# Patient Record
Sex: Female | Born: 1965 | Race: White | Hispanic: No | Marital: Single | Smoking: Never smoker
Health system: Southern US, Community
[De-identification: ages and names within clinical notes are randomized; demographics above are authoritative.]

## PROBLEM LIST (undated history)

## (undated) DIAGNOSIS — E8881 Metabolic syndrome: Secondary | ICD-10-CM

## (undated) DIAGNOSIS — R748 Abnormal levels of other serum enzymes: Secondary | ICD-10-CM

## (undated) DIAGNOSIS — L249 Irritant contact dermatitis, unspecified cause: Secondary | ICD-10-CM

## (undated) DIAGNOSIS — E559 Vitamin D deficiency, unspecified: Secondary | ICD-10-CM

## (undated) DIAGNOSIS — T7840XA Allergy, unspecified, initial encounter: Secondary | ICD-10-CM

## (undated) HISTORY — DX: Irritant contact dermatitis, unspecified cause: L24.9

## (undated) HISTORY — DX: Abnormal levels of other serum enzymes: R74.8

## (undated) HISTORY — DX: Metabolic syndrome: E88.81

## (undated) HISTORY — DX: Metabolic syndrome: E88.810

## (undated) HISTORY — DX: Vitamin D deficiency, unspecified: E55.9

## (undated) HISTORY — DX: Allergy, unspecified, initial encounter: T78.40XA

---

## 2008-08-15 ENCOUNTER — Ambulatory Visit: Payer: Self-pay | Admitting: Family Medicine

## 2008-09-21 ENCOUNTER — Ambulatory Visit: Payer: Self-pay | Admitting: Family Medicine

## 2009-03-25 ENCOUNTER — Ambulatory Visit: Payer: Self-pay | Admitting: Family Medicine

## 2009-10-16 ENCOUNTER — Ambulatory Visit: Payer: Self-pay | Admitting: Family Medicine

## 2010-11-18 ENCOUNTER — Ambulatory Visit: Payer: Self-pay | Admitting: Family Medicine

## 2011-10-13 LAB — HM PAP SMEAR: HM PAP: NORMAL

## 2011-11-19 ENCOUNTER — Ambulatory Visit: Payer: Self-pay | Admitting: Family Medicine

## 2012-11-21 ENCOUNTER — Ambulatory Visit: Payer: Self-pay | Admitting: Family Medicine

## 2013-10-17 LAB — LIPID PANEL
Cholesterol: 173 mg/dL (ref 0–200)
HDL: 36 mg/dL (ref 35–70)
LDL Cholesterol: 111 mg/dL
Triglycerides: 129 mg/dL (ref 40–160)

## 2013-10-17 LAB — HEMOGLOBIN A1C: HEMOGLOBIN A1C: 6 % (ref 4.0–6.0)

## 2013-11-29 ENCOUNTER — Ambulatory Visit: Payer: Self-pay | Admitting: Family Medicine

## 2013-11-29 LAB — HM MAMMOGRAPHY: HM Mammogram: NORMAL

## 2014-01-14 ENCOUNTER — Emergency Department: Payer: Self-pay | Admitting: Emergency Medicine

## 2014-10-17 ENCOUNTER — Encounter: Payer: Self-pay | Admitting: Family Medicine

## 2014-10-17 DIAGNOSIS — L249 Irritant contact dermatitis, unspecified cause: Secondary | ICD-10-CM | POA: Insufficient documentation

## 2014-10-17 DIAGNOSIS — Z780 Asymptomatic menopausal state: Secondary | ICD-10-CM | POA: Insufficient documentation

## 2014-10-17 DIAGNOSIS — R748 Abnormal levels of other serum enzymes: Secondary | ICD-10-CM | POA: Insufficient documentation

## 2014-10-17 DIAGNOSIS — E669 Obesity, unspecified: Secondary | ICD-10-CM | POA: Insufficient documentation

## 2014-10-17 DIAGNOSIS — E8881 Metabolic syndrome: Secondary | ICD-10-CM | POA: Insufficient documentation

## 2014-10-17 DIAGNOSIS — E559 Vitamin D deficiency, unspecified: Secondary | ICD-10-CM | POA: Insufficient documentation

## 2014-10-17 DIAGNOSIS — J302 Other seasonal allergic rhinitis: Secondary | ICD-10-CM | POA: Insufficient documentation

## 2014-10-17 DIAGNOSIS — Z91030 Bee allergy status: Secondary | ICD-10-CM | POA: Insufficient documentation

## 2014-10-17 DIAGNOSIS — L42 Pityriasis rosea: Secondary | ICD-10-CM | POA: Insufficient documentation

## 2014-10-18 ENCOUNTER — Ambulatory Visit (INDEPENDENT_AMBULATORY_CARE_PROVIDER_SITE_OTHER): Payer: BLUE CROSS/BLUE SHIELD | Admitting: Family Medicine

## 2014-10-18 ENCOUNTER — Encounter: Payer: Self-pay | Admitting: Family Medicine

## 2014-10-18 ENCOUNTER — Other Ambulatory Visit: Payer: Self-pay | Admitting: Family Medicine

## 2014-10-18 VITALS — HR 79 | Temp 97.7°F | Resp 18 | Ht 65.0 in | Wt 207.7 lb

## 2014-10-18 DIAGNOSIS — Z124 Encounter for screening for malignant neoplasm of cervix: Secondary | ICD-10-CM

## 2014-10-18 DIAGNOSIS — Z1211 Encounter for screening for malignant neoplasm of colon: Secondary | ICD-10-CM

## 2014-10-18 DIAGNOSIS — R748 Abnormal levels of other serum enzymes: Secondary | ICD-10-CM

## 2014-10-18 DIAGNOSIS — L249 Irritant contact dermatitis, unspecified cause: Secondary | ICD-10-CM | POA: Diagnosis not present

## 2014-10-18 DIAGNOSIS — Z7189 Other specified counseling: Secondary | ICD-10-CM

## 2014-10-18 DIAGNOSIS — Z719 Counseling, unspecified: Secondary | ICD-10-CM

## 2014-10-18 DIAGNOSIS — Z13220 Encounter for screening for lipoid disorders: Secondary | ICD-10-CM | POA: Diagnosis not present

## 2014-10-18 DIAGNOSIS — Z0000 Encounter for general adult medical examination without abnormal findings: Secondary | ICD-10-CM

## 2014-10-18 DIAGNOSIS — E559 Vitamin D deficiency, unspecified: Secondary | ICD-10-CM | POA: Diagnosis not present

## 2014-10-18 DIAGNOSIS — Z114 Encounter for screening for human immunodeficiency virus [HIV]: Secondary | ICD-10-CM

## 2014-10-18 DIAGNOSIS — J302 Other seasonal allergic rhinitis: Secondary | ICD-10-CM | POA: Diagnosis not present

## 2014-10-18 DIAGNOSIS — Z1239 Encounter for other screening for malignant neoplasm of breast: Secondary | ICD-10-CM | POA: Diagnosis not present

## 2014-10-18 DIAGNOSIS — R739 Hyperglycemia, unspecified: Secondary | ICD-10-CM

## 2014-10-18 DIAGNOSIS — Z91038 Other insect allergy status: Secondary | ICD-10-CM | POA: Diagnosis not present

## 2014-10-18 DIAGNOSIS — Z01419 Encounter for gynecological examination (general) (routine) without abnormal findings: Secondary | ICD-10-CM

## 2014-10-18 DIAGNOSIS — Z91030 Bee allergy status: Secondary | ICD-10-CM

## 2014-10-18 MED ORDER — EPINEPHRINE 0.3 MG/0.3ML IJ SOAJ: 0.3000 mg | Freq: Once | INTRAMUSCULAR | Status: DC

## 2014-10-18 MED ORDER — FLUTICASONE PROPIONATE 50 MCG/ACT NA SUSP: 2.0000 | NASAL | Status: DC | PRN

## 2014-10-18 MED ORDER — TRIAMCINOLONE ACETONIDE 0.1 % EX CREA: TOPICAL_CREAM | Freq: Two times a day (BID) | CUTANEOUS | Status: DC

## 2014-10-18 NOTE — Progress Notes (Signed)
Name: Tamara Nelson   MRN: [id number]    DOB: Jan 25, 1966   Date:10/18/2014       Progress Note  Subjective  Chief Complaint  Chief Complaint  Patient presents with  . Annual Exam    HPI  Well woman exam: contact dermatitis is under control with prn topical medication, AR is controlled with prn use of nasal spray. 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  Taking Vitamin D otc. Previously had elevation of alkaline phosphatase and glucose.  We will recheck labs.  Not currently sexually active.  Patient Active Problem List   Diagnosis Date Noted  . Bee sting allergy 10/17/2014  . Abnormal serum level of alkaline phosphatase 10/17/2014  . Menopause 10/17/2014  . Irritant contact dermatitis 10/17/2014  . Dysmetabolic syndrome 10/17/2014  . Obesity (BMI 30-39.9) 10/17/2014  . Vitamin D deficiency 10/17/2014  . Allergic rhinitis, seasonal 10/17/2014    History reviewed. No pertinent past surgical history.  Family History  Problem Relation Age of Onset  . Cancer Mother     colon  . Cancer Father     Liver and Lung  . Stroke Father   . Hypertension Brother   . Diabetes Brother     History   Social History  . Marital Status: Single    Spouse Name: N/A  . Number of Children: N/A  . Years of Education: N/A   Occupational History  . Not on file.   Social History Main Topics  . Smoking status: Never Smoker   . Smokeless tobacco: Never Used  . Alcohol Use: No  . Drug Use: No  . Sexual Activity: No   Other Topics Concern  . Not on file   Social History Narrative  . No narrative on file     Current outpatient prescriptions:  .  Cholecalciferol (VITAMIN D) 2000 UNITS CAPS, Take 1 capsule by mouth as needed., Disp: , Rfl:  .  diphenhydrAMINE (BENADRYL ALLERGY) 25 MG tablet, Take 1 tablet by mouth as needed., Disp: , Rfl:  .  fluticasone (FLONASE) 50 MCG/ACT nasal spray, Place 2 sprays into both  nostrils as needed for rhinitis., Disp: 16 g, Rfl: 2 .  glucose blood (ONETOUCH VERIO) test strip, ONETOUCH VERIO (In Vitro Strip)  1 (one) Strip Strip check fsbs daily for 30 days  Quantity: 1;  Refills: 5   Ordered :18-November-2012  Vinnie Langton ;  Started 14-October-2012 Active, Disp: , Rfl:  .  triamcinolone cream (KENALOG) 0.1 %, Apply topically 2 (two) times daily., Disp: 45 g, Rfl: 1 .  EPINEPHrine (EPIPEN 2-PAK) 0.3 mg/0.3 mL IJ SOAJ injection, Inject 0.3 mLs (0.3 mg total) into the muscle once., Disp: 2 Device, Rfl: 0  Allergies  Allergen Reactions  . Bee Venom      ROS   Constitutional: Negative for fever and has lost some weight Respiratory: Negative for cough and shortness of breath.   Cardiovascular: Negative for chest pain or palpitations.  Gastrointestinal: Negative for abdominal pain, had diarrhea yesterday but resolved  Musculoskeletal: Negative for gait problem or joint swelling.  Skin: Negative for rash.  Neurological: Negative for dizziness or headache.  No other specific complaints in a complete review of systems (except as listed in HPI above). Objective  Filed Vitals:   10/18/14 1427  Pulse: 79  Temp: 97.7 F (36.5 C)  TempSrc: Oral  Resp: 18  Height:  (1.651 m)  Weight: 207 lb 11.2 oz (94.212 kg)  SpO2: 97%    Body mass index is 34.56 kg/(m^2).  Physical Exam  Constitutional: Patient appears well-developed and well-nourished. No distress.  HENT: Head: Normocephalic and atraumatic. Ears: B TMs ok, no erythema or effusion; Nose: Nose normal. Mouth/Throat: Oropharynx is clear and moist. No oropharyngeal exudate.  Eyes: Conjunctivae and EOM are normal. Pupils are equal, round, and reactive to light. No scleral icterus.  Neck: Normal range of motion. Neck supple. No JVD present. No thyromegaly present.  Cardiovascular: Normal rate, regular rhythm and normal heart sounds.  No murmur heard. No BLE edema. Pulmonary/Chest: Effort normal and breath sounds  normal. No respiratory distress. Abdominal: Soft. Bowel sounds are normal, no distension. There is no tenderness. no masses Breast: no lumps or masses, no nipple discharge or rashes FEMALE GENITALIA:  External genitalia normal External urethra normal Vaginal vault normal without discharge or lesions Cervix normal without discharge , cervical os friable Bimanual exam normal without masses RECTAL: no external hemorrhoids Musculoskeletal: Normal range of motion, no joint effusions. No gross deformities Neurological: he is alert and oriented to person, place, and time. No cranial nerve deficit. Coordination, balance, strength, speech and gait are normal.  Skin: Skin is warm and dry. Some eczematous patches on her leg Psychiatric: Patient has a normal mood and affect. behavior is normal. Judgment and thought content normal.  PHQ2/9: Depression screen PHQ 2/9 10/18/2014  Decreased Interest 0  Down, Depressed, Hopeless 0  PHQ - 2 Score 0      Fall Risk: Fall Risk  10/18/2014  Falls in the past year? No      Assessment & Plan  1. Well woman exam  2. Cervical cancer screening  - Cytology - PAP  3. Health counseling Discussed importance of 150 minutes of physical activity weekly, eat two servings of fish weekly, eat one serving of tree nuts ( cashews, pistachios, pecans, almonds.Marland Kitchen) every other day, eat 6 servings of fruit/vegetables daily and drink plenty of water and avoid sweet beverages.   4. Breast cancer screening  -mammogram ordered  5. Colon cancer screening Up to date  6. Bee sting allergy  - EPINEPHrine (EPIPEN 2-PAK) 0.3 mg/0.3 mL IJ SOAJ injection; Inject 0.3 mLs (0.3 mg total) into the muscle once.  Dispense: 2 Device; Refill: 0  7. Allergic rhinitis, seasonal  - fluticasone (FLONASE) 50 MCG/ACT nasal spray; Place 2 sprays into both nostrils as needed for rhinitis.  Dispense: 16 g; Refill: 2  8. Irritant contact dermatitis  - triamcinolone cream (KENALOG) 0.1  %; Apply topically 2 (two) times daily.  Dispense: 45 g; Refill: 1  9. Abnormal serum level of alkaline phosphatase  - Comprehensive Metabolic Panel (CMET)  10. Vitamin D deficiency Recheck labs  11. Hyperglycemia  - HgB A1c  12. Lipid screening  - Lipid Profile  13. Screening for HIV (human immunodeficiency virus)  - HIV antibody (with reflex)

## 2014-10-18 NOTE — Patient Instructions (Signed)
Discussed importance of 150 minutes of physical activity weekly, eat two servings of fish weekly, eat one serving of tree nuts ( cashews, pistachios, pecans, almonds..) every other day, eat 6 servings of fruit/vegetables daily and drink plenty of water and avoid sweet beverages. 

## 2014-10-19 ENCOUNTER — Other Ambulatory Visit: Payer: Self-pay | Admitting: Family Medicine

## 2014-10-20 LAB — COMPREHENSIVE METABOLIC PANEL
ALT: 22 IU/L (ref 0–32)
AST: 15 IU/L (ref 0–40)
Albumin/Globulin Ratio: 1.4 (ref 1.1–2.5)
Albumin: 4 g/dL (ref 3.5–5.5)
Alkaline Phosphatase: 149 IU/L — ABNORMAL HIGH (ref 39–117)
BILIRUBIN TOTAL: 0.3 mg/dL (ref 0.0–1.2)
BUN/Creatinine Ratio: 19 (ref 9–23)
BUN: 14 mg/dL (ref 6–24)
CALCIUM: 9.1 mg/dL (ref 8.7–10.2)
CHLORIDE: 101 mmol/L (ref 97–108)
CO2: 23 mmol/L (ref 18–29)
Creatinine, Ser: 0.75 mg/dL (ref 0.57–1.00)
GFR calc Af Amer: 109 mL/min/{1.73_m2} (ref >59)
GFR calc non Af Amer: 95 mL/min/{1.73_m2} (ref >59)
GLUCOSE: 106 mg/dL — AB (ref 65–99)
Globulin, Total: 2.8 g/dL (ref 1.5–4.5)
POTASSIUM: 4.4 mmol/L (ref 3.5–5.2)
Sodium: 137 mmol/L (ref 134–144)
Total Protein: 6.8 g/dL (ref 6.0–8.5)

## 2014-10-20 LAB — LIPID PANEL
CHOLESTEROL TOTAL: 181 mg/dL (ref 100–199)
Chol/HDL Ratio: 4.9 ratio units — ABNORMAL HIGH (ref 0.0–4.4)
HDL: 37 mg/dL — ABNORMAL LOW (ref >39)
LDL CALC: 123 mg/dL — AB (ref 0–99)
Triglycerides: 105 mg/dL (ref 0–149)
VLDL CHOLESTEROL CAL: 21 mg/dL (ref 5–40)

## 2014-10-20 LAB — HEMOGLOBIN A1C
Est. average glucose Bld gHb Est-mCnc: 128 mg/dL
HEMOGLOBIN A1C: 6.1 % — AB (ref 4.8–5.6)

## 2014-10-20 LAB — VITAMIN D 25 HYDROXY (VIT D DEFICIENCY, FRACTURES): Vit D, 25-Hydroxy: 26.2 ng/mL — ABNORMAL LOW (ref 30.0–100.0)

## 2014-10-20 LAB — HIV ANTIBODY (ROUTINE TESTING W REFLEX): HIV Screen 4th Generation wRfx: NONREACTIVE

## 2014-10-24 LAB — PAP LB, RFX HPV ASCU: PAP Smear Comment: 0

## 2015-02-17 ENCOUNTER — Other Ambulatory Visit: Payer: Self-pay | Admitting: Family Medicine

## 2015-04-15 ENCOUNTER — Telehealth: Payer: Self-pay

## 2015-04-15 ENCOUNTER — Encounter: Payer: Self-pay | Admitting: Family Medicine

## 2015-04-15 ENCOUNTER — Ambulatory Visit (INDEPENDENT_AMBULATORY_CARE_PROVIDER_SITE_OTHER): Payer: BLUE CROSS/BLUE SHIELD | Admitting: Family Medicine

## 2015-04-15 VITALS — BP 128/74 | HR 74 | Temp 98.1°F | Resp 18 | Wt 204.2 lb

## 2015-04-15 DIAGNOSIS — Z800 Family history of malignant neoplasm of digestive organs: Secondary | ICD-10-CM | POA: Diagnosis not present

## 2015-04-15 DIAGNOSIS — J069 Acute upper respiratory infection, unspecified: Secondary | ICD-10-CM | POA: Diagnosis not present

## 2015-04-15 DIAGNOSIS — K625 Hemorrhage of anus and rectum: Secondary | ICD-10-CM

## 2015-04-15 DIAGNOSIS — R490 Dysphonia: Secondary | ICD-10-CM | POA: Diagnosis not present

## 2015-04-15 MED ORDER — BENZONATATE 100 MG PO CAPS: 100.0000 mg | ORAL_CAPSULE | Freq: Two times a day (BID) | ORAL | Status: DC | PRN

## 2015-04-15 MED ORDER — PREDNISONE 10 MG PO TABS: 10.0000 mg | ORAL_TABLET | Freq: Every day | ORAL | Status: DC

## 2015-04-15 NOTE — Telephone Encounter (Signed)
Patient is asking to go to Dr. Bluf[facility]h office at [facility] for Colonoscopy due to family history.

## 2015-04-15 NOTE — Progress Notes (Signed)
Name: Tamara HaberBarbara Jean Nelson   MRN: [id number]    DOB: 04/20/1966   Date:04/15/2015       Progress Note  Subjective  Chief Complaint  Chief Complaint  Patient presents with  . URI    onset 4 days and worsening.  Syptoms include: nose bleeds, watery eyes, sore throat, cough and headache    HPI  URI: symptoms started four days ago, nasal congestion, clear rhinorrhea, hoarseness, mild scratchy throat and dry cough.  No fever, no wheezing. She has noticed some headache. Appetite is normal, no rashes. Taking otc medication and symptoms are helping her sleep but wants to take something for her symptoms.   Rectal Bleeding: she has noticed some rectal pruritus and bright blood on toilette paper after a bowel movement. Symptoms are intermittent. No personal history of constipation or straining. Family history of colon cancer - mother at age 556 yo. She had a colonoscopy done by Dr[facility] Bluf[facility]h at [facility] in 2013.    Patient Active Problem List   Diagnosis Date Noted  . Bee sting allergy 10/17/2014  . Abnormal serum level of alkaline phosphatase 10/17/2014  . Menopause 10/17/2014  . Irritant contact dermatitis 10/17/2014  . Dysmetabolic syndrome 10/17/2014  . Obesity (BMI 30-39.9) 10/17/2014  . Vitamin D deficiency 10/17/2014  . Allergic rhinitis, seasonal 10/17/2014    History reviewed. No pertinent past surgical history.  Family History  Problem Relation Age of Onset  . Cancer Mother     colon  . Cancer Father     Liver and Lung  . Stroke Father   . Hypertension Brother   . Diabetes Brother     Social History   Social History  . Marital Status: Single    Spouse Name: N/A  . Number of Children: N/A  . Years of Education: N/A   Occupational History  . Not on file.   Social History Main Topics  . Smoking status: Never Smoker   . Smokeless tobacco: Never Used  . Alcohol Use: No  . Drug Use: No  . Sexual Activity: No   Other Topics Concern  . Not on file   Social  History Narrative     Current outpatient prescriptions:  .  benzonatate (TESSALON) 100 MG capsule, Take 1-2 capsules (100-200 mg total) by mouth 2 (two) times daily as needed for cough., Disp: 40 capsule, Rfl: 0 .  Cholecalciferol (VITAMIN D) 2000 UNITS CAPS, Take 1 capsule by mouth as needed., Disp: , Rfl:  .  diphenhydrAMINE (BENADRYL ALLERGY) 25 MG tablet, Take 1 tablet by mouth as needed., Disp: , Rfl:  .  EPINEPHrine (EPIPEN 2-PAK) 0.3 mg/0.3 mL IJ SOAJ injection, Inject 0.3 mLs (0.3 mg total) into the muscle once., Disp: 2 Device, Rfl: 0 .  fluticasone (FLONASE) 50 MCG/ACT nasal spray, PLACE 2 SPRAYS INTO BOTH NOSTRILS AS NEEDED FOR RHINITIS., Disp: 16 g, Rfl: 5 .  glucose blood (ONETOUCH VERIO) test strip, ONETOUCH VERIO (In Vitro Strip)  1 (one) Strip Strip check fsbs daily for 30 days  Quantity: 1;  Refills: 5   Ordered :18-November-2012  Vinnie LangtonPalmer, Camille ;  Started 14-October-2012 Active, Disp: , Rfl:  .  predniSONE (DELTASONE) 10 MG tablet, Take 1 tablet (10 mg total) by mouth daily with breakfast., Disp: 10 tablet, Rfl: 0 .  triamcinolone cream (KENALOG) 0.1 %, Apply topically 2 (two) times daily., Disp: 45 g, Rfl: 1  Allergies  Allergen Reactions  . Bee Venom      ROS  Ten systems  reviewed and is negative except as mentioned in HPI   Objective  Filed Vitals:   04/15/15 1600  BP: 128/74  Pulse: 74  Temp: 98.1 F (36.7 C)  TempSrc: Oral  Resp: 18  Weight: 204 lb 3.2 oz (92.625 kg)  SpO2: 97%    Body mass index is 33.98 kg/(m^2).  Physical Exam  Constitutional: Patient appears well-developed and well-nourished. Obese  No distress.  HEENT: head atraumatic, normocephalic, pupils equal and reactive to light, ears TM normal bilaterally neck supple, throat within normal limits Cardiovascular: Normal rate, regular rhythm and normal heart sounds.  No murmur heard. No BLE edema. Pulmonary/Chest: Effort normal and breath sounds normal. No respiratory distress. Abdominal: Soft.   There is no tenderness. Psychiatric: Patient has a normal mood and affect. behavior is normal. Judgment and thought content normal.  PHQ2/9: Depression screen [facility] 2/9 04/15/2015 10/18/2014  Decreased Interest 0 0  Down, Depressed, Hopeless 0 0  PHQ - 2 Score 0 0    Fall Risk: Fall Risk  04/15/2015 10/18/2014  Falls in the past year? No No    Functional Status Survey: Is the patient deaf or have difficulty hearing?: No Does the patient have difficulty seeing, even when wearing glasses/contacts?: No Does the patient have difficulty concentrating, remembering, or making decisions?: No Does the patient have difficulty walking or climbing stairs?: No Does the patient have difficulty dressing or bathing?: No Does the patient have difficulty doing errands alone such as visiting a doctor's office or shopping?: No    Assessment & Plan  1. Upper respiratory infection  - benzonatate (TESSALON) 100 MG capsule; Take 1-2 capsules (100-200 mg total) by mouth 2 (two) times daily as needed for cough.  Dispense: 40 capsule; Refill: 0  2. Hoarseness of voice  - predniSONE (DELTASONE) 10 MG tablet; Take 1 tablet (10 mg total) by mouth daily with breakfast.  Dispense: 10 tablet; Refill: 0  3. Rectal bleeding  - Ambulatory referral to Gastroenterology - [facility] -Dr. Bluford Kaufmann  4. Family history of colon cancer  Mother age 50

## 2015-04-18 NOTE — Telephone Encounter (Signed)
Sent referral to Dr. OBluford Kaufmann

## 2015-06-06 LAB — HM COLONOSCOPY

## 2015-06-10 ENCOUNTER — Encounter: Payer: Self-pay | Admitting: Family Medicine

## 2015-09-09 ENCOUNTER — Other Ambulatory Visit: Payer: Self-pay | Admitting: Family Medicine

## 2015-09-09 NOTE — Telephone Encounter (Signed)
Patient requesting refill. 

## 2015-10-02 ENCOUNTER — Encounter: Payer: Self-pay | Admitting: Family Medicine

## 2015-10-02 ENCOUNTER — Ambulatory Visit (INDEPENDENT_AMBULATORY_CARE_PROVIDER_SITE_OTHER): Payer: BLUE CROSS/BLUE SHIELD | Admitting: Family Medicine

## 2015-10-02 VITALS — BP 130/78 | HR 71 | Temp 98.3°F | Resp 16 | Wt 202.0 lb

## 2015-10-02 DIAGNOSIS — Z23 Encounter for immunization: Secondary | ICD-10-CM | POA: Diagnosis not present

## 2015-10-02 DIAGNOSIS — W5501XA Bitten by cat, initial encounter: Secondary | ICD-10-CM | POA: Diagnosis not present

## 2015-10-02 DIAGNOSIS — S61451A Open bite of right hand, initial encounter: Secondary | ICD-10-CM

## 2015-10-02 MED ORDER — AMOXICILLIN-POT CLAVULANATE 875-125 MG PO TABS: 1.0000 | ORAL_TABLET | Freq: Two times a day (BID) | ORAL | Status: DC

## 2015-10-02 NOTE — Progress Notes (Signed)
Name: Tamara Nelson   MRN: [id number]    DOB: 12-30-1965   Date:10/02/2015       Progress Note  Subjective  Chief Complaint  Chief Complaint  Patient presents with  . Animal Bite    patient was bitten by her cat on 2 different areas. one was her right hand and the other was her left forearm and left middle finger. patient does not have any sx other that it being a little tender.    HPI  Cat bite: she states that she got bit by her own cat on Saturday. She was picking the cat up to take to the pound - her neighbor was harassing  her about the cats getting in his yard. She thinks the cat new something was happening. The cat was up to date with rabies shot. She states right hand was very swollen initially but doing better, still tender and red. No fever or chills.   Patient Active Problem List   Diagnosis Date Noted  . Bee sting allergy 10/17/2014  . Abnormal serum level of alkaline phosphatase 10/17/2014  . Menopause 10/17/2014  . Irritant contact dermatitis 10/17/2014  . Dysmetabolic syndrome 10/17/2014  . Obesity (BMI 30-39.9) 10/17/2014  . Vitamin D deficiency 10/17/2014  . Allergic rhinitis, seasonal 10/17/2014    History reviewed. No pertinent past surgical history.  Family History  Problem Relation Age of Onset  . Cancer Mother     colon  . Cancer Father     Liver and Lung  . Stroke Father   . Hypertension Brother   . Diabetes Brother     Social History   Social History  . Marital Status: Single    Spouse Name: N/A  . Number of Children: N/A  . Years of Education: N/A   Occupational History  . Not on file.   Social History Main Topics  . Smoking status: Never Smoker   . Smokeless tobacco: Never Used  . Alcohol Use: No  . Drug Use: No  . Sexual Activity: No   Other Topics Concern  . Not on file   Social History Narrative     Current outpatient prescriptions:  .  Cholecalciferol (VITAMIN D) 2000 UNITS CAPS, Take 1 capsule by mouth as needed.,  Disp: , Rfl:  .  fluticasone (FLONASE) 50 MCG/ACT nasal spray, PLACE 2 SPRAYS INTO BOTH NOSTRILS AS NEEDED FOR RHINITIS., Disp: 16 g, Rfl: 5 .  triamcinolone cream (KENALOG) 0.1 %, Apply topically 2 (two) times daily., Disp: 45 g, Rfl: 1 .  amoxicillin-clavulanate (AUGMENTIN) 875-125 MG tablet, Take 1 tablet by mouth 2 (two) times daily., Disp: 20 tablet, Rfl: 0 .  diphenhydrAMINE (BENADRYL ALLERGY) 25 MG tablet, Take 1 tablet by mouth as needed. Reported on 10/02/2015, Disp: , Rfl:  .  EPINEPHrine (EPIPEN 2-PAK) 0.3 mg/0.3 mL IJ SOAJ injection, Inject 0.3 mLs (0.3 mg total) into the muscle once. (Patient not taking: Reported on 10/02/2015), Disp: 2 Device, Rfl: 0 .  glucose blood (ONETOUCH VERIO) test strip, Reported on 10/02/2015, Disp: , Rfl:   Allergies  Allergen Reactions  . Bee Venom      ROS  Ten systems reviewed and is negative except as mentioned in HPI  Objective  Filed Vitals:   10/02/15 0950  BP: 130/78  Pulse: 71  Temp: 98.3 F (36.8 C)  TempSrc: Oral  Resp: 16  Weight: 202 lb (91.627 kg)  SpO2: 96%    Body mass index is 33.61 kg/(m^2).  Physical Exam  Constitutional: Patient appears well-developed and well-nourished. Obese  No distress.  HEENT: head atraumatic, normocephalic, pupils equal and reactive to light, neck supple, throat within normal limits Cardiovascular: Normal rate, regular rhythm and normal heart sounds.  No murmur heard. No BLE edema. Pulmonary/Chest: Effort normal and breath sounds normal. No respiratory distress. Abdominal: Soft.  There is no tenderness. Psychiatric: Patient has a normal mood and affect. behavior is normal. Judgment and thought content normal. Skin: superficial irritation, some erythema, no oozing, normal rom of wrist and fingers.   PHQ2/9: Depression screen [facility] 2/9 10/02/2015 04/15/2015 10/18/2014  Decreased Interest 0 0 0  Down, Depressed, Hopeless 0 0 0  PHQ - 2 Score 0 0 0    Fall Risk: Fall Risk  10/02/2015 04/15/2015  10/18/2014  Falls in the past year? No No No    Functional Status Survey: Is the patient deaf or have difficulty hearing?: No Does the patient have difficulty seeing, even when wearing glasses/contacts?: No Does the patient have difficulty concentrating, remembering, or making decisions?: No Does the patient have difficulty walking or climbing stairs?: No Does the patient have difficulty dressing or bathing?: No Does the patient have difficulty doing errands alone such as visiting a doctor's office or shopping?: No   Assessment & Plan  1. Cat bite of hand, right, initial encounter  Keep it clean, another Tdap today - amoxicillin-clavulanate (AUGMENTIN) 875-125 MG tablet; Take 1 tablet by mouth 2 (two) times daily.  Dispense: 20 tablet; Refill: 0   2. Need for Tdap vaccination  - Tdap vaccine greater than or equal to 7yo IM

## 2015-10-21 ENCOUNTER — Encounter: Payer: Self-pay | Admitting: Family Medicine

## 2015-10-21 ENCOUNTER — Ambulatory Visit (INDEPENDENT_AMBULATORY_CARE_PROVIDER_SITE_OTHER): Payer: BLUE CROSS/BLUE SHIELD | Admitting: Family Medicine

## 2015-10-21 VITALS — BP 122/66 | HR 71 | Temp 98.1°F | Resp 16 | Ht 65.0 in | Wt 203.9 lb

## 2015-10-21 DIAGNOSIS — R748 Abnormal levels of other serum enzymes: Secondary | ICD-10-CM | POA: Diagnosis not present

## 2015-10-21 DIAGNOSIS — E559 Vitamin D deficiency, unspecified: Secondary | ICD-10-CM

## 2015-10-21 DIAGNOSIS — E785 Hyperlipidemia, unspecified: Secondary | ICD-10-CM | POA: Diagnosis not present

## 2015-10-21 DIAGNOSIS — R5383 Other fatigue: Secondary | ICD-10-CM

## 2015-10-21 DIAGNOSIS — Z0000 Encounter for general adult medical examination without abnormal findings: Secondary | ICD-10-CM

## 2015-10-21 DIAGNOSIS — Z01419 Encounter for gynecological examination (general) (routine) without abnormal findings: Secondary | ICD-10-CM

## 2015-10-21 DIAGNOSIS — E8881 Metabolic syndrome: Secondary | ICD-10-CM | POA: Diagnosis not present

## 2015-10-21 NOTE — Progress Notes (Signed)
Name: Tamara Nelson   MRN: [id number]    DOB: Sep 23, 1965   Date:10/21/2015       Progress Note  Subjective  Chief Complaint  Chief Complaint  Patient presents with  . Annual Exam    HPI  Well Woman: she is not sexually active, she is up to date with pap smear, mammogram is due now and she will schedule it. Colonoscopy was done in Feb because of rectal bleeding and family history of colon cancer. She feels tired and would like to have labs done    Patient Active Problem List   Diagnosis Date Noted  . Dyslipidemia 10/21/2015  . Bee sting allergy 10/17/2014  . Abnormal serum level of alkaline phosphatase 10/17/2014  . Menopause 10/17/2014  . Irritant contact dermatitis 10/17/2014  . Dysmetabolic syndrome 10/17/2014  . Obesity (BMI 30-39.9) 10/17/2014  . Vitamin D deficiency 10/17/2014  . Allergic rhinitis, seasonal 10/17/2014    History reviewed. No pertinent past surgical history.  Family History  Problem Relation Age of Onset  . Cancer Mother     colon  . Cancer Father     Liver and Lung  . Stroke Father   . Hypertension Brother   . Diabetes Brother     Social History   Social History  . Marital Status: Single    Spouse Name: N/A  . Number of Children: N/A  . Years of Education: N/A   Occupational History  . Not on file.   Social History Main Topics  . Smoking status: Never Smoker   . Smokeless tobacco: Never Used  . Alcohol Use: No  . Drug Use: No  . Sexual Activity: No   Other Topics Concern  . Not on file   Social History Narrative     Current outpatient prescriptions:  .  Cholecalciferol (VITAMIN D) 2000 UNITS CAPS, Take 1 capsule by mouth as needed., Disp: , Rfl:  .  diphenhydrAMINE (BENADRYL ALLERGY) 25 MG tablet, Take 1 tablet by mouth as needed. Reported on 10/02/2015, Disp: , Rfl:  .  EPINEPHrine (EPIPEN 2-PAK) 0.3 mg/0.3 mL IJ SOAJ injection, Inject 0.3 mLs (0.3 mg total) into the muscle once., Disp: 2 Device, Rfl: 0 .  fluticasone  (FLONASE) 50 MCG/ACT nasal spray, PLACE 2 SPRAYS INTO BOTH NOSTRILS AS NEEDED FOR RHINITIS., Disp: 16 g, Rfl: 5 .  glucose blood (ONETOUCH VERIO) test strip, Reported on 10/02/2015, Disp: , Rfl:  .  triamcinolone cream (KENALOG) 0.1 %, Apply topically 2 (two) times daily., Disp: 45 g, Rfl: 1  Allergies  Allergen Reactions  . Bee Venom      ROS  Constitutional: Negative for fever or weight change.  Respiratory: Negative for cough and shortness of breath.   Cardiovascular: Negative for chest pain or palpitations.  Gastrointestinal: Negative for abdominal pain, no bowel changes.  Musculoskeletal: Negative for gait problem or joint swelling.  Skin: Negative for rash.  Neurological: Negative for dizziness or headache.  No other specific complaints in a complete review of systems (except as listed in HPI above).  Objective  Filed Vitals:   10/21/15 1406  BP: 122/66  Pulse: 71  Temp: 98.1 F (36.7 C)  TempSrc: Oral  Resp: 16  Height:  (1.651 m)  Weight: 203 lb 14.4 oz (92.488 kg)  SpO2: 96%    Body mass index is 33.93 kg/(m^2).  Physical Exam  Constitutional: Patient appears well-developed and well-nourished.Obese  No distress.  HENT: Head: Normocephalic and atraumatic. Ears: B TMs ok, no erythema  or effusion; Nose: Nose normal. Mouth/Throat: Oropharynx is clear and moist. No oropharyngeal exudate.  Eyes: Conjunctivae and EOM are normal. Pupils are equal, round, and reactive to light. No scleral icterus.  Neck: Normal range of motion. Neck supple. No JVD present. No thyromegaly present.  Cardiovascular: Normal rate, regular rhythm and normal heart sounds.  No murmur heard. No BLE edema. Pulmonary/Chest: Effort normal and breath sounds normal. No respiratory distress. Abdominal: Soft. Bowel sounds are normal, no distension. There is no tenderness. no masses Breast: no lumps or masses, no nipple discharge or rashes FEMALE GENITALIA:  External genitalia normal External  urethra normal Vaginal vault normal without discharge or lesions Cervix normal without discharge or lesions Bimanual exam normal without masses RECTAL: no rectal masses or hemorrhoids Musculoskeletal: Normal range of motion, no joint effusions. No gross deformities Neurological: he is alert and oriented to person, place, and time. No cranial nerve deficit. Coordination, balance, strength, speech and gait are normal.  Skin: Skin is warm and dry. No rash noted. No erythema.  Psychiatric: Patient has a normal mood and affect. behavior is normal. Judgment and thought content normal.  PHQ2/9: [facility] 2/[facility] 10/02/2015 04/15/2015 10/18/2014  Decreased Interest 0 0 0  Down, Depressed, Hopeless 0 0 0  PHQ - 2 Score 0 0 0     Fall Risk: Fall Risk  10/02/2015 04/15/2015 10/18/2014  Falls in the past year? No No No     Assessment & Plan  1. Well woman exam  Discussed importance of 150 minutes of physical activity weekly, eat two servings of fish weekly, eat one serving of tree nuts ( cashews, pistachios, pecans, almonds.Marland Kitchen.) every other day, eat 6 servings of fruit/vegetables daily and drink plenty of water and avoid sweet beverages.   2. Vitamin D deficiency  - VITAMIN D 25 Hydroxy (Vit-D Deficiency, Fractures)  3. Dysmetabolic syndrome  - Hemoglobin A1c  4. Abnormal serum level of alkaline phosphatase  Recheck labs  5. Other fatigue  - Vitamin B12 - VITAMIN D 25 Hydroxy (Vit-D Deficiency, Fractures) - CBC with Differential/Platelet - Comprehensive metabolic panel - Hemoglobin A1c - TSH  6. Dyslipidemia  - Lipid panel

## 2015-10-22 LAB — CBC WITH DIFFERENTIAL/PLATELET
BASOS PCT: 0 %
Basophils Absolute: 0 cells/uL (ref 0–200)
EOS PCT: 2 %
Eosinophils Absolute: 120 cells/uL (ref 15–500)
HCT: 40 % (ref 35.0–45.0)
HEMOGLOBIN: 13.5 g/dL (ref 11.7–15.5)
LYMPHS ABS: 2220 {cells}/uL (ref 850–3900)
Lymphocytes Relative: 37 %
MCH: 29.2 pg (ref 27.0–33.0)
MCHC: 33.8 g/dL (ref 32.0–36.0)
MCV: 86.4 fL (ref 80.0–100.0)
MONO ABS: 360 {cells}/uL (ref 200–950)
MPV: 9.4 fL (ref 7.5–12.5)
Monocytes Relative: 6 %
NEUTROS ABS: 3300 {cells}/uL (ref 1500–7800)
Neutrophils Relative %: 55 %
Platelets: 275 10*3/uL (ref 140–400)
RBC: 4.63 MIL/uL (ref 3.80–5.10)
RDW: 14.1 % (ref 11.0–15.0)
WBC: 6 10*3/uL (ref 3.8–10.8)

## 2015-10-22 LAB — LIPID PANEL
CHOLESTEROL: 167 mg/dL (ref 125–200)
HDL: 41 mg/dL — ABNORMAL LOW (ref >=46)
LDL Cholesterol: 107 mg/dL (ref <130)
Total CHOL/HDL Ratio: 4.1 Ratio (ref <=5.0)
Triglycerides: 95 mg/dL (ref <150)
VLDL: 19 mg/dL (ref <30)

## 2015-10-22 LAB — COMPREHENSIVE METABOLIC PANEL
ALBUMIN: 4 g/dL (ref 3.6–5.1)
ALK PHOS: 145 U/L — AB (ref 33–115)
ALT: 16 U/L (ref 6–29)
AST: 14 U/L (ref 10–35)
BILIRUBIN TOTAL: 0.3 mg/dL (ref 0.2–1.2)
BUN: 14 mg/dL (ref 7–25)
CO2: 23 mmol/L (ref 20–31)
CREATININE: 0.74 mg/dL (ref 0.50–1.10)
Calcium: 9 mg/dL (ref 8.6–10.2)
Chloride: 103 mmol/L (ref 98–110)
Glucose, Bld: 94 mg/dL (ref 65–99)
Potassium: 4.1 mmol/L (ref 3.5–5.3)
SODIUM: 138 mmol/L (ref 135–146)
Total Protein: 6.8 g/dL (ref 6.1–8.1)

## 2015-10-22 LAB — HEMOGLOBIN A1C
HEMOGLOBIN A1C: 5.7 % — AB (ref <5.7)
MEAN PLASMA GLUCOSE: 117 mg/dL

## 2015-10-22 LAB — TSH: TSH: 2.41 mIU/L

## 2015-10-22 LAB — VITAMIN B12: Vitamin B-12: 228 pg/mL (ref 200–1100)

## 2015-10-23 ENCOUNTER — Other Ambulatory Visit: Payer: Self-pay | Admitting: Family Medicine

## 2015-10-23 DIAGNOSIS — E538 Deficiency of other specified B group vitamins: Secondary | ICD-10-CM

## 2015-10-23 LAB — VITAMIN D 25 HYDROXY (VIT D DEFICIENCY, FRACTURES): VIT D 25 HYDROXY: 31 ng/mL (ref 30–100)

## 2015-10-23 MED ORDER — B-12 1000 MCG SL SUBL: 1.0000 | SUBLINGUAL_TABLET | Freq: Every day | SUBLINGUAL | Status: DC

## 2015-10-25 ENCOUNTER — Telehealth: Payer: Self-pay | Admitting: Family Medicine

## 2015-10-25 NOTE — Telephone Encounter (Signed)
PT SAID THAT THE DR TOLD HER TO TAKE THE B12 TABLETS BUT DID NOT SAY HOW MUCH . PLEASE CALL HER AT [phone].

## 2015-10-25 NOTE — Telephone Encounter (Signed)
Lab results state: B12 is low, needs to either take otc B12 1000 mg SL daily or come in for B12 shots monthly

## 2015-10-25 NOTE — Telephone Encounter (Signed)
LFT MESSAGE ABOUT MESSAGE LEFT FROM NURSE

## 2015-11-06 ENCOUNTER — Ambulatory Visit (INDEPENDENT_AMBULATORY_CARE_PROVIDER_SITE_OTHER): Payer: BLUE CROSS/BLUE SHIELD

## 2015-11-06 ENCOUNTER — Encounter (INDEPENDENT_AMBULATORY_CARE_PROVIDER_SITE_OTHER): Payer: Self-pay

## 2015-11-06 DIAGNOSIS — E538 Deficiency of other specified B group vitamins: Secondary | ICD-10-CM

## 2015-11-06 MED ORDER — CYANOCOBALAMIN 1000 MCG/ML IJ SOLN
1000.0000 ug | Freq: Once | INTRAMUSCULAR | Status: AC
  Administered 2015-11-06: 1000 ug via INTRAMUSCULAR

## 2015-12-09 ENCOUNTER — Ambulatory Visit: Payer: BLUE CROSS/BLUE SHIELD

## 2015-12-09 DIAGNOSIS — E538 Deficiency of other specified B group vitamins: Secondary | ICD-10-CM

## 2015-12-09 MED ORDER — CYANOCOBALAMIN 1000 MCG/ML IJ SOLN: 1000.0000 ug | Freq: Once | INTRAMUSCULAR | Status: DC

## 2015-12-24 ENCOUNTER — Other Ambulatory Visit: Payer: Self-pay | Admitting: Family Medicine

## 2015-12-24 DIAGNOSIS — Z1231 Encounter for screening mammogram for malignant neoplasm of breast: Secondary | ICD-10-CM

## 2016-01-01 ENCOUNTER — Encounter: Payer: Self-pay | Admitting: Family Medicine

## 2016-01-01 ENCOUNTER — Ambulatory Visit (INDEPENDENT_AMBULATORY_CARE_PROVIDER_SITE_OTHER): Payer: BLUE CROSS/BLUE SHIELD | Admitting: Family Medicine

## 2016-01-01 VITALS — BP 126/80 | HR 68 | Temp 98.0°F | Resp 14 | Ht 65.0 in | Wt 204.1 lb

## 2016-01-01 DIAGNOSIS — M79671 Pain in right foot: Secondary | ICD-10-CM | POA: Diagnosis not present

## 2016-01-01 DIAGNOSIS — L853 Xerosis cutis: Secondary | ICD-10-CM | POA: Diagnosis not present

## 2016-01-01 MED ORDER — MELOXICAM 15 MG PO TABS: 15.0000 mg | ORAL_TABLET | Freq: Every day | ORAL | 0 refills | Status: DC

## 2016-01-01 MED ORDER — AMMONIUM LACTATE 12 % EX CREA
TOPICAL_CREAM | CUTANEOUS | 0 refills | Status: DC | PRN
Start: 2016-01-01 — End: 2019-04-14

## 2016-01-01 NOTE — Progress Notes (Signed)
Name: Tamara Nelson   MRN: [id number]    DOB: 02-Dec-1965   Date:01/01/2016       Progress Note  Subjective  Chief Complaint  Chief Complaint  Patient presents with  . Foot Pain    R foot pain    HPI  Right foot problem: she noticed right lateral column foot pain and swelling, that is worse at night and also two spots/dry skin on 4th and 5th toe. Pain on right foot is getting worse and is bothering her sleep. She wears tennis shoes on both jobs. No redness or increase in warmth. No trauma   Patient Active Problem List   Diagnosis Date Noted  . B12 deficiency 10/23/2015  . Dyslipidemia 10/21/2015  . Bee sting allergy 10/17/2014  . Abnormal serum level of alkaline phosphatase 10/17/2014  . Menopause 10/17/2014  . Irritant contact dermatitis 10/17/2014  . Dysmetabolic syndrome 26/71/2458  . Obesity (BMI 30-39.9) 10/17/2014  . Vitamin D deficiency 10/17/2014  . Allergic rhinitis, seasonal 10/17/2014    History reviewed. No pertinent surgical history.  Family History  Problem Relation Age of Onset  . Cancer Mother     colon  . Cancer Father     Liver and Lung  . Stroke Father   . Hypertension Brother   . Diabetes Brother     Social History   Social History  . Marital status: Single    Spouse name: N/A  . Number of children: N/A  . 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 Smoking status: Never Smoker  . Smokeless tobacco: Never Used  . Alcohol use No  . Drug use: No  . Sexual activity: No   Other Topics Concern  . Not on file   Social History Narrative   Lives alone, works at [employer] during the day and for [employer] at night, 5 nights weekly      Current Outpatient Prescriptions:  .  ammonium lactate (LAC-HYDRIN) 12 % cream, Apply topically as needed for dry skin., Disp: 385 g, Rfl: 0 .  Cholecalciferol (VITAMIN D) 2000 UNITS CAPS, Take 1 capsule by  mouth as needed., Disp: , Rfl:  .  Cyanocobalamin (B-12) 1000 MCG SUBL, Place 1 tablet under the tongue daily., Disp: 30 each, Rfl: 0 .  diphenhydrAMINE (BENADRYL ALLERGY) 25 MG tablet, Take 1 tablet by mouth as needed. Reported on 10/02/2015, Disp: , Rfl:  .  EPINEPHrine (EPIPEN 2-PAK) 0.3 mg/0.3 mL IJ SOAJ injection, Inject 0.3 mLs (0.3 mg total) into the muscle once., Disp: 2 Device, Rfl: 0 .  fluticasone (FLONASE) 50 MCG/ACT nasal spray, PLACE 2 SPRAYS INTO BOTH NOSTRILS AS NEEDED FOR RHINITIS., Disp: 16 g, Rfl: 5 .  glucose blood (ONETOUCH VERIO) test strip, Reported on 10/02/2015, Disp: , Rfl:  .  meloxicam (MOBIC) 15 MG tablet, Take 1 tablet (15 mg total) by mouth daily., Disp: 30 tablet, Rfl: 0 .  triamcinolone cream (KENALOG) 0.1 %, Apply topically 2 (two) times daily., Disp: 45 g, Rfl: 1  Allergies  Allergen Reactions  . Bee Venom      ROS  Ten systems reviewed and is negative except as mentioned in HPI   Objective  Vitals:   01/01/16 1502  BP: 126/80  Pulse: 68  Resp: 14  Temp: 98 F (36.7 C)  TempSrc: Oral  SpO2: 95%  Weight: 204 lb 1.6 oz (92.6 kg)  Height: '5\' 5"'$  (1.651  m)    Body mass index is 33.96 kg/m.  Physical Exam  Constitutional: Patient appears well-developed and well-nourished. Obese  No distress.  HEENT: head atraumatic, normocephalic, pupils equal and reactive to light,  neck supple, throat within normal limits Cardiovascular: Normal rate, regular rhythm and normal heart sounds.  No murmur heard. No BLE edema. Pulmonary/Chest: Effort normal and breath sounds normal. No respiratory distress. Abdominal: Soft.  There is no tenderness. Psychiatric: Patient has a normal mood and affect. behavior is normal. Judgment and thought content normal. Muscular Skeletal: pain during palpation of right lateral column, mild edema, no redness or increase in warmth.   Recent Results (from the past 2160 hour(s))  Lipid panel     Status: Abnormal   Collection Time:  10/21/15  9:12 AM  Result Value Ref Range   Cholesterol 167 125 - 200 mg/dL   Triglycerides 95 <962 mg/dL   HDL 41 (L) >=83 mg/dL   Total CHOL/HDL Ratio 4.1 <=5.0 Ratio   VLDL 19 <30 mg/dL   LDL Cholesterol 662 <947 mg/dL    Comment:   Total Cholesterol/HDL Ratio:CHD Risk                        Coronary Heart Disease Risk Table                                        Men       Women          1/2 Average Risk              3.4        3.3              Average Risk              5.0        4.4           2X Average Risk              9.6        7.1           3X Average Risk             23.4       11.0 Use the calculated Patient Ratio above and the CHD Risk table  to determine the patient's CHD Risk.   Vitamin B12     Status: None   Collection Time: 10/21/15  9:12 AM  Result Value Ref Range   Vitamin B-12 228 200 - 1,100 pg/mL  VITAMIN D 25 Hydroxy (Vit-D Deficiency, Fractures)     Status: None   Collection Time: 10/21/15  9:12 AM  Result Value Ref Range   Vit D, 25-Hydroxy 31 30 - 100 ng/mL    Comment: Vitamin D Status           25-OH Vitamin D        Deficiency                <20 ng/mL        Insufficiency         20 - 29 ng/mL        Optimal             > or = 30 ng/mL   For 25-OH Vitamin D testing on patients on D2-supplementation and patients for whom quantitation of  D2 and D3 fractions is required, the [facility] 25-OH VIT D, (D2,D3), LC/MS/MS is recommended: order code [id number] (patients > 2 yrs).   CBC with Differential/Platelet     Status: None   Collection Time: 10/21/15  9:12 AM  Result Value Ref Range   WBC 6.0 3.8 - 10.8 K/uL   RBC 4.63 3.80 - 5.10 MIL/uL   Hemoglobin 13.5 11.7 - 15.5 g/dL   HCT 40.0 35.0 - 45.0 %   MCV 86.4 80.0 - 100.0 fL   MCH 29.2 27.0 - 33.0 pg   MCHC 33.8 32.0 - 36.0 g/dL   RDW 14.1 11.0 - 15.0 %   Platelets 275 140 - 400 K/uL   MPV 9.4 7.5 - 12.5 fL   Neutro Abs 3,300 1,500 - 7,800 cells/uL   Lymphs Abs 2,220 850 - 3,900 cells/uL    Monocytes Absolute 360 200 - 950 cells/uL   Eosinophils Absolute 120 15 - 500 cells/uL   Basophils Absolute 0 0 - 200 cells/uL   Neutrophils Relative % 55 %   Lymphocytes Relative 37 %   Monocytes Relative 6 %   Eosinophils Relative 2 %   Basophils Relative 0 %   Smear Review Criteria for review not met     Comment: ** Please note change in unit of measure and reference range(s). **  Comprehensive metabolic panel     Status: Abnormal   Collection Time: 10/21/15  9:12 AM  Result Value Ref Range   Sodium 138 135 - 146 mmol/L   Potassium 4.1 3.5 - 5.3 mmol/L   Chloride 103 98 - 110 mmol/L   CO2 23 20 - 31 mmol/L   Glucose, Bld 94 65 - 99 mg/dL   BUN 14 7 - 25 mg/dL   Creat 0.74 0.50 - 1.10 mg/dL   Total Bilirubin 0.3 0.2 - 1.2 mg/dL   Alkaline Phosphatase 145 (H) 33 - 115 U/L   AST 14 10 - 35 U/L   ALT 16 6 - 29 U/L   Total Protein 6.8 6.1 - 8.1 g/dL   Albumin 4.0 3.6 - 5.1 g/dL   Calcium 9.0 8.6 - 10.2 mg/dL  Hemoglobin A1c     Status: Abnormal   Collection Time: 10/21/15  9:12 AM  Result Value Ref Range   Hgb A1c MFr Bld 5.7 (H) <5.7 %    Comment:   For someone without known diabetes, a hemoglobin A1c value between 5.7% and 6.4% is consistent with prediabetes and should be confirmed with a follow-up test.   For someone with known diabetes, a value <7% indicates that their diabetes is well controlled. A1c targets should be individualized based on duration of diabetes, age, co-morbid conditions and other considerations.   This assay result is consistent with an increased risk of diabetes.   Currently, no consensus exists regarding use of hemoglobin A1c for diagnosis of diabetes in children.      Mean Plasma Glucose 117 mg/dL  TSH     Status: None   Collection Time: 10/21/15  9:12 AM  Result Value Ref Range   TSH 2.41 mIU/L    Comment:   Reference Range   > or = 20 Years  0.40-4.50   Pregnancy Range First trimester  0.26-2.66 Second trimester 0.55-2.73 Third  trimester  0.43-2.91        PHQ2/9: Depression screen [facility] 2/9 01/01/2016 10/02/2015 04/15/2015 10/18/2014  Decreased Interest 0 0 0 0  Down, Depressed, Hopeless 0 0 0 0  PHQ - 2 Score  0 0 0 0     Fall Risk: Fall Risk  01/01/2016 10/02/2015 04/15/2015 10/18/2014  Falls in the past year? No No No No      Functional Status Survey: Is the patient deaf or have difficulty hearing?: No Does the patient have difficulty seeing, even when wearing glasses/contacts?: No Does the patient have difficulty concentrating, remembering, or making decisions?: No Does the patient have difficulty walking or climbing stairs?: No Does the patient have difficulty dressing or bathing?: No Does the patient have difficulty doing errands alone such as visiting a doctor's office or shopping?: No    Assessment & Plan  1. Right foot pain  Discussed referral to Podiatrist but she would like to hold off because of cost, we will try nsaid's, ice and rest, elevation  - meloxicam (MOBIC) 15 MG tablet; Take 1 tablet (15 mg total) by mouth daily.  Dispense: 30 tablet; Refill: 0  2. Dry skin   - ammonium lactate (LAC-HYDRIN) 12 % cream; Apply topically as needed for dry skin.  Dispense: 385 g; Refill: 0

## 2016-01-13 ENCOUNTER — Ambulatory Visit (INDEPENDENT_AMBULATORY_CARE_PROVIDER_SITE_OTHER): Payer: BLUE CROSS/BLUE SHIELD

## 2016-01-13 DIAGNOSIS — E538 Deficiency of other specified B group vitamins: Secondary | ICD-10-CM

## 2016-01-13 MED ORDER — CYANOCOBALAMIN 1000 MCG/ML IJ SOLN
1000.0000 ug | Freq: Once | INTRAMUSCULAR | Status: AC
  Administered 2016-01-13: 1000 ug via INTRAMUSCULAR

## 2016-01-23 ENCOUNTER — Ambulatory Visit
Admission: RE | Admit: 2016-01-23 | Discharge: 2016-01-23 | Disposition: A | Discharge status: Home / Self Care | Payer: BLUE CROSS/BLUE SHIELD | Source: Ambulatory Visit | Attending: Family Medicine | Admitting: Family Medicine

## 2016-01-23 ENCOUNTER — Other Ambulatory Visit: Payer: Self-pay | Admitting: Family Medicine

## 2016-01-23 DIAGNOSIS — Z1231 Encounter for screening mammogram for malignant neoplasm of breast: Secondary | ICD-10-CM

## 2016-01-27 ENCOUNTER — Other Ambulatory Visit: Payer: Self-pay

## 2016-01-27 DIAGNOSIS — M79671 Pain in right foot: Secondary | ICD-10-CM

## 2016-01-27 MED ORDER — MELOXICAM 15 MG PO TABS: 15.0000 mg | ORAL_TABLET | Freq: Every day | ORAL | 0 refills | Status: DC

## 2016-01-27 NOTE — Telephone Encounter (Signed)
Patient requesting refill of [facility]. Please send in 90 day supply.

## 2016-01-28 ENCOUNTER — Other Ambulatory Visit: Payer: Self-pay | Admitting: Family Medicine

## 2016-01-28 ENCOUNTER — Ambulatory Visit: Payer: BLUE CROSS/BLUE SHIELD | Admitting: Family Medicine

## 2016-01-28 DIAGNOSIS — M79671 Pain in right foot: Secondary | ICD-10-CM

## 2016-01-28 NOTE — Telephone Encounter (Signed)
Patient requesting [facility] of [facility].  

## 2016-02-06 DIAGNOSIS — Z23 Encounter for immunization: Secondary | ICD-10-CM | POA: Diagnosis not present

## 2016-02-18 ENCOUNTER — Ambulatory Visit (INDEPENDENT_AMBULATORY_CARE_PROVIDER_SITE_OTHER): Payer: BLUE CROSS/BLUE SHIELD | Admitting: Family Medicine

## 2016-02-18 ENCOUNTER — Ambulatory Visit: Payer: BLUE CROSS/BLUE SHIELD

## 2016-02-18 ENCOUNTER — Encounter: Payer: Self-pay | Admitting: Family Medicine

## 2016-02-18 VITALS — BP 124/82 | HR 71 | Temp 97.9°F | Resp 16 | Ht 65.0 in | Wt 199.5 lb

## 2016-02-18 DIAGNOSIS — Z91038 Other insect allergy status: Secondary | ICD-10-CM | POA: Diagnosis not present

## 2016-02-18 DIAGNOSIS — E538 Deficiency of other specified B group vitamins: Secondary | ICD-10-CM | POA: Diagnosis not present

## 2016-02-18 DIAGNOSIS — Z91030 Bee allergy status: Secondary | ICD-10-CM

## 2016-02-18 MED ORDER — CYANOCOBALAMIN 1000 MCG/ML IJ SOLN
1000.0000 ug | Freq: Once | INTRAMUSCULAR | Status: AC
  Administered 2016-02-18: 1000 ug via INTRAMUSCULAR

## 2016-02-18 NOTE — Progress Notes (Signed)
Name: Tamara Nelson   MRN: [id number]    DOB: 21-May-1965   Date:02/18/2016       Progress Note  Subjective  Chief Complaint  Chief Complaint  Patient presents with  . Insect Bite    Was stung this past Saturday 10/14. Three sting on right leg. Has been taking bendryl.    HPI  B12 deficiency: feeling better, more energy, she has been getting monthly B12 injection since July 2017. She has mild tingling on tip of fingers, but is intermittently  Bee sting allergy: she was stung by 3 bees this past weekend, while smoking the beehive. She did not develop SOB, stridor, dizziness, nausea or vomiting, only localized reaction, with redness, increase in warmth and induration.  She did not have to use her Epipen, but took benadryl, swelling is going down and is not tender to touch   Patient Active Problem List   Diagnosis Date Noted  . B12 deficiency 10/23/2015  . Dyslipidemia 10/21/2015  . Bee sting allergy 10/17/2014  . Abnormal serum level of alkaline phosphatase 10/17/2014  . Menopause 10/17/2014  . Irritant contact dermatitis 10/17/2014  . Dysmetabolic syndrome 10/17/2014  . Obesity (BMI 30-39.9) 10/17/2014  . Vitamin D deficiency 10/17/2014  . Allergic rhinitis, seasonal 10/17/2014    History reviewed. No pertinent surgical history.  Family History  Problem Relation Age of Onset  . Cancer Mother     colon  . Cancer Father     Liver and Lung  . Stroke Father   . Hypertension Brother   . Diabetes Brother   . Breast cancer Neg Hx     Social History   Social History  . Marital status: Single    Spouse name: N/A  . Number of children: N/A  . 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 Smoking status: Never Smoker  . Smokeless tobacco: Never Used  . Alcohol use No  . Drug use: No  . Sexual activity: No   Other Topics Concern  . Not on file   Social History Narrative   Lives alone,  works at [employer] during the day and for [employer] at night, 5 nights weekly      Current Outpatient Prescriptions:  .  ammonium lactate (LAC-HYDRIN) 12 % cream, Apply topically as needed for dry skin., Disp: 385 g, Rfl: 0 .  Cholecalciferol (VITAMIN D) 2000 UNITS CAPS, Take 1 capsule by mouth as needed., Disp: , Rfl:  .  Cyanocobalamin (B-12) 1000 MCG SUBL, Place 1 tablet under the tongue daily., Disp: 30 each, Rfl: 0 .  diphenhydrAMINE (BENADRYL ALLERGY) 25 MG tablet, Take 1 tablet by mouth as needed. Reported on 10/02/2015, Disp: , Rfl:  .  EPINEPHrine (EPIPEN 2-PAK) 0.3 mg/0.3 mL IJ SOAJ injection, Inject 0.3 mLs (0.3 mg total) into the muscle once., Disp: 2 Device, Rfl: 0 .  fluticasone (FLONASE) 50 MCG/ACT nasal spray, PLACE 2 SPRAYS INTO BOTH NOSTRILS AS NEEDED FOR RHINITIS., Disp: 16 g, Rfl: 5 .  meloxicam (MOBIC) 15 MG tablet, Take 1 tablet (15 mg total) by mouth daily., Disp: 90 tablet, Rfl: 0 .  triamcinolone cream (KENALOG) 0.1 %, Apply topically 2 (two) times daily., Disp: 45 g, Rfl: 1 .  glucose blood (ONETOUCH VERIO) test strip, Reported on 10/02/2015, Disp: , Rfl:   Allergies  Allergen Reactions  . Bee Venom      ROS  Ten systems reviewed  and is negative except as mentioned in HPI   Objective  Vitals:   02/18/16 1116  BP: 124/82  Pulse: 71  Resp: 16  Temp: 97.9 F (36.6 C)  SpO2: 95%  Weight: 199 lb 8 oz (90.5 kg)  Height: 5\' 5"  (1.651 m)    Body mass index is 33.2 kg/m.  Physical Exam  Constitutional: Patient appears well-developed and well-nourished. Obese  No distress.  HEENT: head atraumatic, normocephalic, pupils equal and reactive to light,  neck supple, throat within normal limits Cardiovascular: Normal rate, regular rhythm and normal heart sounds.  No murmur heard. No BLE edema. Pulmonary/Chest: Effort normal and breath sounds normal. No respiratory distress. Abdominal: Soft.  There is no tenderness. Psychiatric: Patient has a  normal mood and affect. behavior is normal. Judgment and thought content normal. Skin: three port of entry on right anterior thigh, one of the areas has some induration but no signs of infection   PHQ2/9: Depression screen [facility] 2/9 02/18/2016 01/01/2016 10/02/2015 04/15/2015 10/18/2014  Decreased Interest 0 0 0 0 0  Down, Depressed, Hopeless 0 0 0 0 0  PHQ - 2 Score 0 0 0 0 0    Fall Risk: Fall Risk  02/18/2016 01/01/2016 10/02/2015 04/15/2015 10/18/2014  Falls in the past year? No No No No No    Functional Status Survey: Is the patient deaf or have difficulty hearing?: No Does the patient have difficulty seeing, even when wearing glasses/contacts?: No Does the patient have difficulty concentrating, remembering, or making decisions?: No Does the patient have difficulty walking or climbing stairs?: No Does the patient have difficulty dressing or bathing?: No Does the patient have difficulty doing errands alone such as visiting a doctor's office or shopping?: No   Assessment & Plan  1. Bee sting allergy  Doing well, advised to take Loratadine of Allegra until symptoms resolves  2. B12 deficiency  - cyanocobalamin ((VITAMIN B-12)) injection 1,000 mcg; Inject 1 mL (1,000 mcg total) into the muscle once.

## 2016-03-23 ENCOUNTER — Ambulatory Visit (INDEPENDENT_AMBULATORY_CARE_PROVIDER_SITE_OTHER): Payer: BLUE CROSS/BLUE SHIELD

## 2016-03-23 DIAGNOSIS — E538 Deficiency of other specified B group vitamins: Secondary | ICD-10-CM | POA: Diagnosis not present

## 2016-03-23 MED ORDER — CYANOCOBALAMIN 1000 MCG/ML IJ SOLN
1000.0000 ug | Freq: Once | INTRAMUSCULAR | Status: AC
  Administered 2016-03-23: 1000 ug via INTRAMUSCULAR

## 2016-04-20 ENCOUNTER — Other Ambulatory Visit: Payer: Self-pay | Admitting: Family Medicine

## 2016-04-20 NOTE — Telephone Encounter (Signed)
Patient [facility] 

## 2016-04-23 ENCOUNTER — Ambulatory Visit (INDEPENDENT_AMBULATORY_CARE_PROVIDER_SITE_OTHER): Payer: BLUE CROSS/BLUE SHIELD

## 2016-04-23 DIAGNOSIS — E538 Deficiency of other specified B group vitamins: Secondary | ICD-10-CM

## 2016-04-23 MED ORDER — CYANOCOBALAMIN 1000 MCG/ML IJ SOLN
1000.0000 ug | Freq: Once | INTRAMUSCULAR | Status: AC
Start: 2016-04-23 — End: 2016-04-23
  Administered 2016-04-23: 1000 ug via INTRAMUSCULAR

## 2016-04-24 ENCOUNTER — Ambulatory Visit: Payer: BLUE CROSS/BLUE SHIELD

## 2016-05-05 ENCOUNTER — Other Ambulatory Visit: Payer: Self-pay | Admitting: Family Medicine

## 2016-05-05 DIAGNOSIS — M79671 Pain in right foot: Secondary | ICD-10-CM

## 2016-05-06 ENCOUNTER — Encounter: Payer: Self-pay | Admitting: Family Medicine

## 2016-05-06 ENCOUNTER — Ambulatory Visit (INDEPENDENT_AMBULATORY_CARE_PROVIDER_SITE_OTHER): Payer: BLUE CROSS/BLUE SHIELD | Admitting: Family Medicine

## 2016-05-06 VITALS — BP 118/78 | HR 81 | Temp 98.9°F | Resp 16 | Ht 65.0 in | Wt 206.1 lb

## 2016-05-06 DIAGNOSIS — J302 Other seasonal allergic rhinitis: Secondary | ICD-10-CM

## 2016-05-06 DIAGNOSIS — J40 Bronchitis, not specified as acute or chronic: Secondary | ICD-10-CM | POA: Diagnosis not present

## 2016-05-06 MED ORDER — PREDNISONE 20 MG PO TABS: 20.0000 mg | ORAL_TABLET | Freq: Two times a day (BID) | ORAL | 0 refills | Status: DC

## 2016-05-06 MED ORDER — FLUTICASONE FUROATE-VILANTEROL 100-25 MCG/INH IN AEPB: 1.0000 | INHALATION_SPRAY | Freq: Every day | RESPIRATORY_TRACT | 0 refills | Status: DC

## 2016-05-06 MED ORDER — AZITHROMYCIN 250 MG PO TABS: ORAL_TABLET | ORAL | 0 refills | Status: DC

## 2016-05-06 NOTE — Progress Notes (Signed)
Name: Glora Hulgan   MRN: [id number]    DOB: [birth year]   Date:05/06/2016       Progress Note  Subjective  Chief Complaint  Chief Complaint  Patient presents with  . Facial Pain    for 2 weeks and OTC medciations are not helping  . Cough    HPI  AR/bronchitis: she states that she has been sick for the past two weeks. Initially had cold symptoms such as clear rhinorrhea and headaches. She has tried otc medication without help. She is now having a dry cough, day and night and is affecting her sleep. No wheezing or SOB. She has nasal congestion, and states has been using nasal steroid. Advised to use saline spray also and try Mucinex. No fever, normal appetite no change in bowel movements.    Patient Active Problem List   Diagnosis Date Noted  . B12 deficiency 10/23/2015  . Dyslipidemia 10/21/2015  . Bee sting allergy 10/17/2014  . Abnormal serum level of alkaline phosphatase 10/17/2014  . Menopause 10/17/2014  . Irritant contact dermatitis 10/17/2014  . Dysmetabolic syndrome 10/17/2014  . Obesity (BMI 30-39.9) 10/17/2014  . Vitamin D deficiency 10/17/2014  . Allergic rhinitis, seasonal 10/17/2014    History reviewed. No pertinent surgical history.  Family History  Problem Relation Age of Onset  . Cancer Mother     colon  . Cancer Father     Liver and Lung  . Stroke Father   . Hypertension Brother   . Diabetes Brother   . Breast cancer Neg Hx     Social History   Social History  . Marital status: Single    Spouse name: N/A  . Number of children: N/A  . 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 Smoking status: Never Smoker  . Smokeless tobacco: Never Used  . Alcohol use No  . Drug use: No  . Sexual activity: No   Other Topics Concern  . Not on file   Social History Narrative   Lives alone, works at [employer] during the day and for [employer] at night, 5  nights weekly      Current Outpatient Prescriptions:  .  ammonium lactate (LAC-HYDRIN) 12 % cream, Apply topically as needed for dry skin., Disp: 385 g, Rfl: 0 .  azithromycin (ZITHROMAX) 250 MG tablet, Take as directed, Disp: 6 tablet, Rfl: 0 .  Cholecalciferol (VITAMIN D) 2000 UNITS CAPS, Take 1 capsule by mouth as needed., Disp: , Rfl:  .  Cyanocobalamin (B-12) 1000 MCG SUBL, Place 1 tablet under the tongue daily., Disp: 30 each, Rfl: 0 .  diphenhydrAMINE (BENADRYL ALLERGY) 25 MG tablet, Take 1 tablet by mouth as needed. Reported on 10/02/2015, Disp: , Rfl:  .  EPINEPHrine (EPIPEN 2-PAK) 0.3 mg/0.3 mL IJ SOAJ injection, Inject 0.3 mLs (0.3 mg total) into the muscle once., Disp: 2 Device, Rfl: 0 .  fluticasone (FLONASE) 50 MCG/ACT nasal spray, PLACE 2 SPRAYS INTO BOTH NOSTRILS AS NEEDED FOR RHINITIS., Disp: 16 g, Rfl: 5 .  fluticasone furoate-vilanterol (BREO ELLIPTA) 100-25 MCG/INH AEPB, Inhale 1 puff into the lungs daily., Disp: 60 each, Rfl: 0 .  glucose blood (ONETOUCH VERIO) test strip, Reported on 10/02/2015, Disp: , Rfl:  .  meloxicam (MOBIC) 15 MG tablet, TAKE 1 TABLET (15 MG TOTAL) BY MOUTH DAILY., Disp: 90 tablet, Rfl: 0 .  predniSONE (DELTASONE) 20 MG tablet, Take 1 tablet (20  mg total) by mouth 2 (two) times daily with a meal., Disp: 6 tablet, Rfl: 0 .  triamcinolone cream (KENALOG) 0.1 %, Apply topically 2 (two) times daily., Disp: 45 g, Rfl: 1  Allergies  Allergen Reactions  . Bee Venom      ROS  Ten systems reviewed and is negative except as mentioned in HPI   Objective  Vitals:   05/06/16 1520  BP: 118/78  Pulse: 81  Resp: 16  Temp: 98.9 F (37.2 C)  SpO2: 96%  Weight: 206 lb 2 oz (93.5 kg)  Height: 5\' 5"  (1.651 m)    Body mass index is 34.3 kg/m.  Physical Exam  Constitutional: Patient appears well-developed and well-nourished. Obese No distress.  HEENT: head atraumatic, normocephalic, pupils equal and reactive to light, ears normal tim bilaterally,   neck supple, throat within normal limits Cardiovascular: Normal rate, regular rhythm and normal heart sounds.  No murmur heard. No BLE edema. Pulmonary/Chest: Effort normal and breath sounds normal. No respiratory distress. Abdominal: Soft.  There is no tenderness. Psychiatric: Patient has a normal mood and affect. behavior is normal. Judgment and thought content normal.  PH[facility]: [facility] 02/18/2016 01/01/2016 10/02/2015 04/15/2015 10/18/2014  Decreased Interest 0 0 0 0 0  Down, Depressed, Hopeless 0 0 0 0 0  PHQ - 2 Score 0 0 0 0 0     Fall Risk: Fall Risk  02/18/2016 01/01/2016 10/02/2015 04/15/2015 10/18/2014  Falls in the past year? No No No No No      Assessment & Plan  1. Bronchitis  - fluticasone furoate-vilanterol (BREO ELLIPTA) 100-25 MCG/INH AEPB; Inhale 1 puff into the lungs daily.  Dispense: 60 each; Refill: 0 - predniSONE (DELTASONE) 20 MG tablet; Take 1 tablet (20 mg total) by mouth 2 (two) times daily with a meal.  Dispense: 6 tablet; Refill: 0 - azithromycin (ZITHROMAX) 250 MG tablet; Take as directed  Dispense: 6 tablet; Refill: 0  2. Seasonal allergic rhinitis, unspecified chronicity, unspecified trigger  Resume nasal spray daily, mucinex DM and may try Loratadine instead of Benadryl

## 2016-05-20 ENCOUNTER — Other Ambulatory Visit: Payer: Self-pay | Admitting: Family Medicine

## 2016-05-20 DIAGNOSIS — J40 Bronchitis, not specified as acute or chronic: Secondary | ICD-10-CM

## 2016-06-02 DIAGNOSIS — H5203 Hypermetropia, bilateral: Secondary | ICD-10-CM | POA: Diagnosis not present

## 2016-06-06 ENCOUNTER — Other Ambulatory Visit: Payer: Self-pay | Admitting: Family Medicine

## 2016-06-06 DIAGNOSIS — J40 Bronchitis, not specified as acute or chronic: Secondary | ICD-10-CM

## 2016-06-08 NOTE — Telephone Encounter (Signed)
[facility]  

## 2016-06-23 ENCOUNTER — Encounter: Payer: Self-pay | Admitting: Family Medicine

## 2016-06-23 ENCOUNTER — Ambulatory Visit (INDEPENDENT_AMBULATORY_CARE_PROVIDER_SITE_OTHER): Payer: BLUE CROSS/BLUE SHIELD | Admitting: Family Medicine

## 2016-06-23 VITALS — BP 122/78 | HR 65 | Temp 98.6°F | Resp 16 | Wt 211.6 lb

## 2016-06-23 DIAGNOSIS — J302 Other seasonal allergic rhinitis: Secondary | ICD-10-CM | POA: Diagnosis not present

## 2016-06-23 DIAGNOSIS — B9789 Other viral agents as the cause of diseases classified elsewhere: Secondary | ICD-10-CM

## 2016-06-23 DIAGNOSIS — Z91038 Other insect allergy status: Secondary | ICD-10-CM

## 2016-06-23 DIAGNOSIS — J069 Acute upper respiratory infection, unspecified: Secondary | ICD-10-CM

## 2016-06-23 DIAGNOSIS — Z91030 Bee allergy status: Secondary | ICD-10-CM

## 2016-06-23 MED ORDER — LEVOCETIRIZINE DIHYDROCHLORIDE 5 MG PO TABS: 5.0000 mg | ORAL_TABLET | Freq: Every evening | ORAL | 2 refills | Status: DC

## 2016-06-23 MED ORDER — AZELASTINE HCL 0.1 % NA SOLN: 2.0000 | Freq: Two times a day (BID) | NASAL | 2 refills | Status: DC

## 2016-06-23 MED ORDER — EPINEPHRINE 0.3 MG/0.3ML IJ SOAJ
0.3000 mg | Freq: Once | INTRAMUSCULAR | 0 refills | Status: DC
Start: 2016-06-23 — End: 2016-07-20

## 2016-06-23 NOTE — Progress Notes (Signed)
Name: Tamara Nelson   MRN: [id number]    DOB: [birth year]   Date:06/23/2016       Progress Note  Subjective  Chief Complaint  Chief Complaint  Patient presents with  . Sinus Problem    rightside pain, watery eyes,itchy eyes, runny nose for 2-3 days  . Medication Refill    epipen    HPI  Seasonal allergic rhinitis: she states she was outside on Sunday when weather was in the 60 degrees, that evening she developed rhinorrhea, nasal congestion, post-nasal drainage and right facial pressure and pain behind right eye. She has itchy eyes for the past three weeks, seen by ophthalmologist and was given an eye drops.. She has some post-nasal drainage and cough. No fever, chills, lack of appetite, no nausea, or change in bowel movements.   Patient Active Problem List   Diagnosis Date Noted  . B12 deficiency 10/23/2015  . Dyslipidemia 10/21/2015  . Bee sting allergy 10/17/2014  . Abnormal serum level of alkaline phosphatase 10/17/2014  . Menopause 10/17/2014  . Irritant contact dermatitis 10/17/2014  . Dysmetabolic syndrome 10/17/2014  . Obesity (BMI 30-39.9) 10/17/2014  . Vitamin D deficiency 10/17/2014  . Allergic rhinitis, seasonal 10/17/2014    History reviewed. No pertinent surgical history.  Family History  Problem Relation Age of Onset  . Cancer Mother     colon  . Cancer Father     Liver and Lung  . Stroke Father   . Hypertension Brother   . Diabetes Brother   . Breast cancer Neg Hx     Social History   Social History  . Marital status: Single    Spouse name: N/A  . Number of children: N/A  . 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 Smoking status: Never Smoker  . Smokeless tobacco: Never Used  . Alcohol use No  . Drug use: No  . Sexual activity: No   Other Topics Concern  . Not on file   Social History Narrative   Lives alone, works at [employer] during the day  and for [employer] at night, 5 nights weekly      Current Outpatient Prescriptions:  .  ammonium lactate (LAC-HYDRIN) 12 % cream, Apply topically as needed for dry skin., Disp: 385 g, Rfl: 0 .  azelastine (ASTELIN) 0.1 % nasal spray, Place 2 sprays into both nostrils 2 (two) times daily. Use in each nostril as directed, Disp: 30 mL, Rfl: 2 .  Cholecalciferol (VITAMIN D) 2000 UNITS CAPS, Take 1 capsule by mouth as needed., Disp: , Rfl:  .  Cyanocobalamin (B-12) 1000 MCG SUBL, Place 1 tablet under the tongue daily., Disp: 30 each, Rfl: 0 .  diphenhydrAMINE (BENADRYL ALLERGY) 25 MG tablet, Take 1 tablet by mouth as needed. Reported on 10/02/2015, Disp: , Rfl:  .  EPINEPHrine (EPIPEN 2-PAK) 0.3 mg/0.3 mL IJ SOAJ injection, Inject 0.3 mLs (0.3 mg total) into the muscle once., Disp: 2 Device, Rfl: 0 .  fluticasone (FLONASE) 50 MCG/ACT nasal spray, PLACE 2 SPRAYS INTO BOTH NOSTRILS AS NEEDED FOR RHINITIS., Disp: 16 g, Rfl: 5 .  glucose blood (ONETOUCH VERIO) test strip, Reported on 10/02/2015, Disp: , Rfl:  .  levocetirizine (XYZAL) 5 MG tablet, Take 1 tablet (5 mg total) by mouth every evening., Disp: 30 tablet, Rfl: 2 .  meloxicam (MOBIC) 15 MG tablet, TAKE 1 TABLET (15 MG TOTAL) BY MOUTH DAILY., Disp:  90 tablet, Rfl: 0 .  Olopatadine HCl 0.2 % SOLN, INSTILL 1 DROP INTO BOTH EYES EVERY MORNING, Disp: , Rfl: 3 .  triamcinolone cream (KENALOG) 0.1 %, Apply topically 2 (two) times daily., Disp: 45 g, Rfl: 1  Allergies  Allergen Reactions  . Bee Venom      ROS  Ten systems reviewed and is negative except as mentioned in HPI   Objective  Vitals:   06/23/16 1553  BP: 122/78  Pulse: 65  Resp: 16  Temp: 98.6 F (37 C)  SpO2: 96%  Weight: 211 lb 9 oz (96 kg)    Body mass index is 35.21 kg/m.  Physical Exam  Constitutional: Patient appears well-developed and well-nourished. Obese No distress.  HEENT: head atraumatic, normocephalic, pupils equal and reactive to light, ears TM  showed air fluid level on left side , neck supple, throat within normal limits, clear rhinorrhea.  Cardiovascular: Normal rate, regular rhythm and normal heart sounds.  No murmur heard. No BLE edema. Pulmonary/Chest: Effort normal and breath sounds normal. No respiratory distress. Abdominal: Soft.  There is no tenderness. Psychiatric: Patient has a normal mood and affect. behavior is normal. Judgment and thought content normal.   PH[facility] 2/9: [facility] 02/18/2016 01/01/2016 10/02/2015 04/15/2015 10/18/2014  Decreased Interest 0 0 0 0 0  Down, Depressed, Hopeless 0 0 0 0 0  PHQ - 2 Score 0 0 0 0 0     Fall Risk: Fall Risk  02/18/2016 01/01/2016 10/02/2015 04/15/2015 10/18/2014  Falls in the past year? No No No No No    Assessment & Plan  1. Seasonal allergic rhinitis, unspecified chronicity, unspecified trigger  We will add AStelin and Xyzal, but if no improvement we will refer her to ENT or Allergist.  - azelastine (ASTELIN) 0.1 % nasal spray; Place 2 sprays into both nostrils 2 (two) times daily. Use in each nostril as directed  Dispense: 30 mL; Refill: 2 - levocetirizine (XYZAL) 5 MG tablet; Take 1 tablet (5 mg total) by mouth every evening.  Dispense: 30 tablet; Refill: 2  2. Viral upper respiratory tract infection  Less likely the cause of her symptoms, it seems to be secondary to allergies.   3. Bee sting allergy  - EPINEPHrine (EPIPEN 2-PAK) 0.3 mg/0.3 mL IJ SOAJ injection; Inject 0.3 mLs (0.3 mg total) into the muscle once.  Dispense: 2 Device; Refill: 0

## 2016-06-24 ENCOUNTER — Telehealth: Payer: Self-pay | Admitting: Family Medicine

## 2016-06-24 NOTE — Telephone Encounter (Signed)
Pt called stating something was supposed to be done with a Epi Pen yesterday and pt has not heard anything else about it. Pt would like a call back.

## 2016-07-20 ENCOUNTER — Other Ambulatory Visit: Payer: Self-pay | Admitting: Family Medicine

## 2016-07-20 ENCOUNTER — Telehealth: Payer: Self-pay | Admitting: Family Medicine

## 2016-07-20 DIAGNOSIS — Z91030 Bee allergy status: Secondary | ICD-10-CM

## 2016-07-20 NOTE — Telephone Encounter (Signed)
Requesting a prescription for epi pen. States that she only received one from the pharmacy but needs 2. Please send to [facility] [phone]

## 2016-07-29 ENCOUNTER — Other Ambulatory Visit: Payer: Self-pay

## 2016-07-29 DIAGNOSIS — J302 Other seasonal allergic rhinitis: Secondary | ICD-10-CM

## 2016-07-29 MED ORDER — AZELASTINE HCL 0.1 % NA SOLN: 2.0000 | Freq: Two times a day (BID) | NASAL | 2 refills | Status: DC

## 2016-07-29 MED ORDER — LEVOCETIRIZINE DIHYDROCHLORIDE 5 MG PO TABS: 5.0000 mg | ORAL_TABLET | Freq: Every evening | ORAL | 2 refills | Status: DC

## 2016-07-29 NOTE — Telephone Encounter (Addendum)
Patient requesting refill of Azelastine Nasal spray and Levocetirizine with a 90 day supply to [facility].

## 2016-08-01 ENCOUNTER — Other Ambulatory Visit: Payer: Self-pay | Admitting: Family Medicine

## 2016-08-01 DIAGNOSIS — M79671 Pain in right foot: Secondary | ICD-10-CM

## 2016-08-12 ENCOUNTER — Other Ambulatory Visit: Payer: Self-pay | Admitting: Family Medicine

## 2016-08-12 MED ORDER — EPINEPHRINE 0.3 MG/0.3ML IJ SOAJ: 0.3000 mg | Freq: Once | INTRAMUSCULAR | 1 refills | Status: AC

## 2016-08-12 NOTE — Telephone Encounter (Signed)
Please close encounter

## 2016-08-19 ENCOUNTER — Other Ambulatory Visit: Payer: Self-pay

## 2016-08-19 NOTE — Telephone Encounter (Addendum)
Patient requesting refill of Levocetrizine and [facility]astine to [facility] with a 90 day supply.

## 2016-08-20 MED ORDER — LEVOCETIRIZINE DIHYDROCHLORIDE 5 MG PO TABS: 5.0000 mg | ORAL_TABLET | Freq: Every evening | ORAL | 2 refills | Status: DC

## 2016-08-20 MED ORDER — AZELASTINE HCL 0.1 % NA SOLN: 2.0000 | Freq: Two times a day (BID) | NASAL | 2 refills | Status: DC

## 2016-08-24 ENCOUNTER — Telehealth: Payer: Self-pay | Admitting: Family Medicine

## 2016-08-24 ENCOUNTER — Other Ambulatory Visit: Payer: Self-pay | Admitting: Family Medicine

## 2016-08-24 DIAGNOSIS — E538 Deficiency of other specified B group vitamins: Secondary | ICD-10-CM

## 2016-08-24 NOTE — Progress Notes (Signed)
Patient noticed by voicemail.

## 2016-08-24 NOTE — Telephone Encounter (Signed)
Patient would like a lab order to have her B12 levels re-checked.  Please call patient once she can come in to have lab drawn.

## 2016-08-24 NOTE — Telephone Encounter (Signed)
Pt stated that she would come in one day this week

## 2016-08-25 DIAGNOSIS — E538 Deficiency of other specified B group vitamins: Secondary | ICD-10-CM | POA: Diagnosis not present

## 2016-08-26 LAB — VITAMIN B12: Vitamin B-12: 1369 pg/mL — ABNORMAL HIGH (ref 232–1245)

## 2016-08-28 ENCOUNTER — Telehealth: Payer: Self-pay

## 2016-08-28 NOTE — Telephone Encounter (Signed)
B12 was annotated. Only lab drawn

## 2016-08-28 NOTE — Telephone Encounter (Signed)
Patient was calling for lab results, please review and send back to me. So I can notify the patient. Thanks

## 2016-08-28 NOTE — Telephone Encounter (Signed)
Patient last injection was in December and informed she does not need to come back to for another one per Dr. Carlynn Purl. Will have blood work rechecked during next visit.

## 2016-09-14 ENCOUNTER — Ambulatory Visit (INDEPENDENT_AMBULATORY_CARE_PROVIDER_SITE_OTHER): Payer: BLUE CROSS/BLUE SHIELD | Admitting: Family Medicine

## 2016-09-14 ENCOUNTER — Encounter: Payer: Self-pay | Admitting: Family Medicine

## 2016-09-14 VITALS — BP 122/68 | HR 76 | Temp 98.2°F | Resp 16 | Ht 65.0 in | Wt 213.8 lb

## 2016-09-14 DIAGNOSIS — E8881 Metabolic syndrome: Secondary | ICD-10-CM | POA: Diagnosis not present

## 2016-09-14 DIAGNOSIS — R202 Paresthesia of skin: Secondary | ICD-10-CM | POA: Diagnosis not present

## 2016-09-14 DIAGNOSIS — E538 Deficiency of other specified B group vitamins: Secondary | ICD-10-CM

## 2016-09-14 DIAGNOSIS — R5383 Other fatigue: Secondary | ICD-10-CM | POA: Diagnosis not present

## 2016-09-14 DIAGNOSIS — R631 Polydipsia: Secondary | ICD-10-CM

## 2016-09-14 DIAGNOSIS — R748 Abnormal levels of other serum enzymes: Secondary | ICD-10-CM

## 2016-09-14 DIAGNOSIS — E559 Vitamin D deficiency, unspecified: Secondary | ICD-10-CM | POA: Diagnosis not present

## 2016-09-14 NOTE — Progress Notes (Signed)
Name: Tamara HaberBarbara Jean Nelson   MRN: [id number]    DOB: 1965/07/29   Date:09/14/2016       Progress Note  Subjective  Chief Complaint  Chief Complaint  Patient presents with  . Allergic Reaction    Feels like she is having a allergic reaction to elevated B12. She is having itching, nausea, back pain, feet and legs achy one day and thrist. Patient states she will have one symptom one day and it resolves and then another symptom will happen. Patient states she has noticed it for the past month.    HPI  Fatigue: she has been working 2 jobs for the past 2 years, she gets at home around 10:30 to 11 pm, goes to bed around midnight and gets up by 6 am. She is waking already feeling tired. She lives alone and not sure if she snores. She is afraid that would be secondary to high level of B12. She also has periods of feeling very thirsty, at times tingling sensation of hands but not feet, aching legs, episodes of nausea and pruritus, no jaundice, denies acholic stools.    Patient Active Problem List   Diagnosis Date Noted  . B12 deficiency 10/23/2015  . Dyslipidemia 10/21/2015  . Bee sting allergy 10/17/2014  . Abnormal serum level of alkaline phosphatase 10/17/2014  . Menopause 10/17/2014  . Irritant contact dermatitis 10/17/2014  . Dysmetabolic syndrome 10/17/2014  . Obesity (BMI 30-39.9) 10/17/2014  . Vitamin D deficiency 10/17/2014  . Allergic rhinitis, seasonal 10/17/2014    No past surgical history on file.  Family History  Problem Relation Age of Onset  . Cancer Mother        colon  . Cancer Father        Liver and Lung  . Stroke Father   . Hypertension Brother   . Diabetes Brother   . Breast cancer Neg Hx     Social History   Social History  . Marital status: Single    Spouse name: N/A  . Number of children: N/A  . 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 Smoking status: Never Smoker  .  Smokeless tobacco: Never Used  . Alcohol use No  . Drug use: No  . Sexual activity: No   Other Topics Concern  . Not on file   Social History Narrative   Lives alone, works at [employer] during the day and for [employer] at night, 5 nights weekly      Current Outpatient Prescriptions:  .  ammonium lactate (LAC-HYDRIN) 12 % cream, Apply topically as needed for dry skin., Disp: 385 g, Rfl: 0 .  azelastine (ASTELIN) 0.1 % nasal spray, Place 2 sprays into both nostrils 2 (two) times daily. Use in each nostril as directed (Patient taking differently: Place 2 sprays into both nostrils 2 (two) times daily as needed. Use in each nostril as directed), Disp: 30 mL, Rfl: 2 .  Cholecalciferol (VITAMIN D) 2000 UNITS CAPS, Take 1 capsule by mouth as needed., Disp: , Rfl:  .  diphenhydrAMINE (BENADRYL ALLERGY) 25 MG tablet, Take 1 tablet by mouth as needed. Reported on 10/02/2015, Disp: , Rfl:  .  fluticasone (FLONASE) 50 MCG/ACT nasal spray, PLACE 2 SPRAYS INTO BOTH NOSTRILS AS NEEDED FOR RHINITIS., Disp: 16 g, Rfl: 5 .  glucose blood (ONETOUCH VERIO) test strip, Reported on 10/02/2015, Disp: , Rfl:  .  Olopatadine HCl 0.2 %  SOLN, INSTILL 1 DROP INTO BOTH EYES EVERY MORNING, Disp: , Rfl: 3 .  triamcinolone cream (KENALOG) 0.1 %, Apply topically 2 (two) times daily., Disp: 45 g, Rfl: 1 .  Cyanocobalamin (B-12) 1000 MCG SUBL, Place 1 tablet under the tongue daily. (Patient not taking: Reported on 09/14/2016), Disp: 30 each, Rfl: 0 .  levocetirizine (XYZAL) 5 MG tablet, Take 1 tablet (5 mg total) by mouth every evening. (Patient not taking: Reported on 09/14/2016), Disp: 90 tablet, Rfl: 2  Allergies  Allergen Reactions  . Bee Venom      ROS  Constitutional: Negative for fever or weight change.  Respiratory: Negative for cough and shortness of breath.   Cardiovascular: Negative for chest pain or palpitations.  Gastrointestinal: Negative for abdominal pain, no bowel changes.   Musculoskeletal: Negative for gait problem or joint swelling.  Skin: Negative for rash.  Neurological: Negative for dizziness or headache.  No other specific complaints in a complete review of systems (except as listed in HPI above).  Objective  Vitals:   09/14/16 1405  BP: 122/68  Pulse: 76  Resp: 16  Temp: 98.2 F (36.8 C)  TempSrc: Oral  SpO2: 95%  Weight: 213 lb 12.8 oz (97 kg)  Height: 5\' 5"  (1.651 m)    Body mass index is 35.58 kg/m.  Physical Exam  Constitutional: Patient appears well-developed and well-nourished. Obese  No distress.  HEENT: head atraumatic, normocephalic, pupils equal and reactive to light, neck supple, throat within normal limits Cardiovascular: Normal rate, regular rhythm and normal heart sounds.  No murmur heard. Trace  BLE edema. Pulmonary/Chest: Effort normal and breath sounds normal. No respiratory distress. Abdominal: Soft.  There is no tenderness. Psychiatric: Patient has a normal mood and affect. behavior is normal. Judgment and thought content normal.  Recent Results (from the past 2160 hour(s))  Vitamin B12     Status: Abnormal   Collection Time: 08/25/16  4:05 PM  Result Value Ref Range   Vitamin B-12 1,369 (H) 232 - 1,245 pg/mL      PHQ2/9: Depression screen [facility] 2/9 09/14/2016 02/18/2016 01/01/2016 10/02/2015 04/15/2015  Decreased Interest 0 0 0 0 0  Down, Depressed, Hopeless 0 0 0 0 0  PHQ - 2 Score 0 0 0 0 0     Fall Risk: Fall Risk  09/14/2016 02/18/2016 01/01/2016 10/02/2015 04/15/2015  Falls in the past year? No No No No No    Functional Status Survey: Is the patient deaf or have difficulty hearing?: No Does the patient have difficulty seeing, even when wearing glasses/contacts?: No Does the patient have difficulty concentrating, remembering, or making decisions?: No Does the patient have difficulty walking or climbing stairs?: No Does the patient have difficulty dressing or bathing?: No Does the patient have  difficulty doing errands alone such as visiting a doctor's office or shopping?: No    Assessment & Plan  1. B12 deficiency  - Vitamin B12  2. Vitamin D deficiency  Continue supplementation and recheck level   3. Dysmetabolic syndrome  - COMPLETE METABOLIC PANEL WITH GFR  4. Abnormal serum level of alkaline phosphatase  Recheck level   5. Other fatigue  She works 2 jobs, and only sleeps 6 hours per night - TSH - Vitamin B12 - VITAMIN D 25 Hydroxy (Vit-D Deficiency, Fractures) - Sedimentation rate - C-reactive protein  6. Polydipsia  - COMPLETE METABOLIC PANEL WITH GFR - Hemoglobin A1c - Insulin, fasting  7. Paresthesia  - CBC with Differential/Platelet - Vitamin B12

## 2016-09-15 LAB — CBC WITH DIFFERENTIAL/PLATELET
Basophils Absolute: 0 cells/uL (ref 0–200)
Basophils Relative: 0 %
EOS ABS: 156 {cells}/uL (ref 15–500)
Eosinophils Relative: 2 %
HEMATOCRIT: 38.9 % (ref 35.0–45.0)
HEMOGLOBIN: 12.6 g/dL (ref 11.7–15.5)
LYMPHS ABS: 2574 {cells}/uL (ref 850–3900)
Lymphocytes Relative: 33 %
MCH: 28.8 pg (ref 27.0–33.0)
MCHC: 32.4 g/dL (ref 32.0–36.0)
MCV: 88.8 fL (ref 80.0–100.0)
MONO ABS: 390 {cells}/uL (ref 200–950)
MPV: 9.2 fL (ref 7.5–12.5)
Monocytes Relative: 5 %
Neutro Abs: 4680 cells/uL (ref 1500–7800)
Neutrophils Relative %: 60 %
Platelets: 271 10*3/uL (ref 140–400)
RBC: 4.38 MIL/uL (ref 3.80–5.10)
RDW: 14.2 % (ref 11.0–15.0)
WBC: 7.8 10*3/uL (ref 3.8–10.8)

## 2016-09-15 LAB — COMPLETE METABOLIC PANEL WITH GFR
ALBUMIN: 3.9 g/dL (ref 3.6–5.1)
ALT: 17 U/L (ref 6–29)
AST: 15 U/L (ref 10–35)
Alkaline Phosphatase: 119 U/L (ref 33–130)
BILIRUBIN TOTAL: 0.3 mg/dL (ref 0.2–1.2)
BUN: 15 mg/dL (ref 7–25)
CALCIUM: 8.9 mg/dL (ref 8.6–10.4)
CHLORIDE: 104 mmol/L (ref 98–110)
CO2: 29 mmol/L (ref 20–31)
CREATININE: 0.93 mg/dL (ref 0.50–1.05)
GFR, Est African American: 83 mL/min (ref >=60)
GFR, Est Non African American: 72 mL/min (ref >=60)
Glucose, Bld: 92 mg/dL (ref 65–99)
Potassium: 3.9 mmol/L (ref 3.5–5.3)
Sodium: 138 mmol/L (ref 135–146)
Total Protein: 6.7 g/dL (ref 6.1–8.1)

## 2016-09-15 LAB — VITAMIN B12: VITAMIN B 12: 698 pg/mL (ref 200–1100)

## 2016-09-15 LAB — HEMOGLOBIN A1C
HEMOGLOBIN A1C: 5.7 % — AB (ref <5.7)
Mean Plasma Glucose: 117 mg/dL

## 2016-09-15 LAB — TSH: TSH: 1.81 mIU/L

## 2016-09-15 LAB — SEDIMENTATION RATE: Sed Rate: 12 mm/hr (ref 0–20)

## 2016-09-15 LAB — INSULIN, FASTING: Insulin fasting, serum: 85.6 u[IU]/mL — ABNORMAL HIGH (ref 2.0–19.6)

## 2016-09-16 LAB — C-REACTIVE PROTEIN: CRP: 7.8 mg/L (ref <8.0)

## 2016-09-16 LAB — VITAMIN D 25 HYDROXY (VIT D DEFICIENCY, FRACTURES): Vit D, 25-Hydroxy: 26 ng/mL — ABNORMAL LOW (ref 30–100)

## 2016-09-17 ENCOUNTER — Telehealth: Payer: Self-pay

## 2016-09-17 NOTE — Telephone Encounter (Signed)
Please review labs and send back so I can notify the patient. Thanks

## 2016-09-18 NOTE — Telephone Encounter (Signed)
done

## 2016-09-26 ENCOUNTER — Emergency Department
Admission: EM | Admit: 2016-09-26 | Discharge: 2016-09-26 | Disposition: A | Discharge status: Home / Self Care | Payer: BLUE CROSS/BLUE SHIELD | Attending: Emergency Medicine | Admitting: Emergency Medicine

## 2016-09-26 ENCOUNTER — Encounter: Payer: Self-pay | Admitting: Emergency Medicine

## 2016-09-26 DIAGNOSIS — Y999 Unspecified external cause status: Secondary | ICD-10-CM | POA: Diagnosis not present

## 2016-09-26 DIAGNOSIS — W5501XA Bitten by cat, initial encounter: Secondary | ICD-10-CM | POA: Diagnosis not present

## 2016-09-26 DIAGNOSIS — Y939 Activity, unspecified: Secondary | ICD-10-CM | POA: Insufficient documentation

## 2016-09-26 DIAGNOSIS — Y929 Unspecified place or not applicable: Secondary | ICD-10-CM | POA: Diagnosis not present

## 2016-09-26 DIAGNOSIS — S59812A Other specified injuries left forearm, initial encounter: Secondary | ICD-10-CM | POA: Diagnosis not present

## 2016-09-26 DIAGNOSIS — S51852A Open bite of left forearm, initial encounter: Secondary | ICD-10-CM | POA: Diagnosis not present

## 2016-09-26 MED ORDER — AMOXICILLIN-POT CLAVULANATE 875-125 MG PO TABS: 1.0000 | ORAL_TABLET | Freq: Two times a day (BID) | ORAL | 0 refills | Status: AC

## 2016-09-26 NOTE — ED Provider Notes (Signed)
[facility] Emergency Department Provider Note  ____________________________________________  Time seen: Approximately 4:37 PM  I have reviewed the triage vital signs and the nursing notes.   HISTORY  Chief Complaint Animal Bite    HPI Tamara Nelson is a 51 y.o. female that presents to emergency department with scratches over left forearm and bite on left forearm for 3 hours.She states that her neighbor's cat has been missing for the last 2 days and she was outside gardening when she saw the cat. A group of neighbors went over to try to catch the cat. When she picked up the cat and carried it back to the neighbor's house the cat started scratching and bit her. She states that his cat is up-to-date on vaccinations. She is not allergic to any antibiotics. Last tetanus shot was less than 5 years ago. She denies shortness breath, chest pain, nausea, vomiting, abdominal pain.   Past Medical History:  Diagnosis Date  . Alkaline phosphatase raised   . Allergy   . Irritant contact dermatitis   . Metabolic syndrome   . Vitamin D deficiency     Patient Active Problem List   Diagnosis Date Noted  . B12 deficiency 10/23/2015  . Dyslipidemia 10/21/2015  . Bee sting allergy 10/17/2014  . Abnormal serum level of alkaline phosphatase 10/17/2014  . Menopause 10/17/2014  . Irritant contact dermatitis 10/17/2014  . Dysmetabolic syndrome 10/17/2014  . Obesity (BMI 30-39.9) 10/17/2014  . Vitamin D deficiency 10/17/2014  . Allergic rhinitis, seasonal 10/17/2014    History reviewed. No pertinent surgical history.  Prior to Admission medications   Medication Sig Start Date End Date Taking? Authorizing Provider  ammonium lactate (LAC-HYDRIN) 12 % cream Apply topically as needed for dry skin. 01/01/16   Alba CorySowles, Krichna, MD  amoxicillin-clavulanate (AUGMENTIN) 875-125 MG tablet Take 1 tablet by mouth 2 (two) times daily. 09/26/16 10/06/16  Enid DerryWagner, Yanai Hobson, PA-C   azelastine (ASTELIN) 0.1 % nasal spray Place 2 sprays into both nostrils 2 (two) times daily. Use in each nostril as directed Patient taking differently: Place 2 sprays into both nostrils 2 (two) times daily as needed. Use in each nostril as directed 08/20/16   Alba CorySowles, Krichna, MD  Cholecalciferol (VITAMIN D) 2000 UNITS CAPS Take 1 capsule by mouth as needed. 06/20/10   [provider]  Cyanocobalamin (B-12) 1000 MCG SUBL Place 1 tablet under the tongue daily. Patient not taking: Reported on 09/14/2016 10/23/15   Alba CorySowles, Krichna, MD  diphenhydrAMINE (BENADRYL ALLERGY) 25 MG tablet Take 1 tablet by mouth as needed. Reported on 10/02/2015    [provider]  fluticasone (FLONASE) 50 MCG/ACT nasal spray PLACE 2 SPRAYS INTO BOTH NOSTRILS AS NEEDED FOR RHINITIS. 04/20/16   Alba CorySowles, Krichna, MD  glucose blood (ONETOUCH VERIO) test strip Reported on 10/02/2015 10/14/12   [provider]  levocetirizine (XYZAL) 5 MG tablet Take 1 tablet (5 mg total) by mouth every evening. Patient not taking: Reported on 09/14/2016 08/20/16   Alba CorySowles, Krichna, MD  Olopatadine HCl 0.2 % SOLN INSTILL 1 DROP INTO BOTH EYES EVERY MORNING 06/02/16   [provider]  triamcinolone cream (KENALOG) 0.1 % Apply topically 2 (two) times daily. 10/18/14   Alba CorySowles, Krichna, MD    Allergies Bee venom  Family History  Problem Relation Age of Onset  . Cancer Mother        colon  . Cancer Father        Liver and Lung  . Stroke Father   . Hypertension  Brother   . Diabetes Brother   . Breast cancer Neg Hx     Social History Social History  Substance Use Topics  . Smoking status: Never Smoker  . Smokeless tobacco: Never Used  . Alcohol use No     Review of Systems  Constitutional: No fever/chills ENT: No upper respiratory complaints. Cardiovascular: No chest pain. Respiratory: No SOB. Gastrointestinal: No abdominal pain.  No nausea, no vomiting.  Skin: Negative for ecchymosis. Positive for  scratch and bite.   ____________________________________________   PHYSICAL EXAM:  VITAL SIGNS: ED Triage Vitals  Enc Vitals Group     BP 09/26/16 1518 120/63     Pulse Rate 09/26/16 1518 82     Resp 09/26/16 1518 20     Temp 09/26/16 1518 98.2 F (36.8 C)     Temp Source 09/26/16 1518 Oral     SpO2 09/26/16 1518 96 %     Weight 09/26/16 1520 212 lb (96.2 kg)     Height 09/26/16 1520 5\' 5"  (1.651 m)     Head Circumference --      Peak Flow --      Pain Score 09/26/16 1518 5     Pain Loc --      Pain Edu? --      Excl. in GC? --      Constitutional: Alert and oriented. Well appearing and in no acute distress. Eyes: Conjunctivae are normal. PERRL. EOMI. Head: Atraumatic. ENT:      Ears:      Nose: No congestion/rhinnorhea.      Mouth/Throat: Mucous membranes are moist.  Neck: No stridor.  Cardiovascular: Normal rate, regular rhythm.  Good peripheral circulation. Respiratory: Normal respiratory effort without tachypnea or retractions. Lungs CTAB. Good air entry to the bases with no decreased or absent breath sounds. Musculoskeletal: Full range of motion to all extremities. No gross deformities appreciated. Neurologic:  Normal speech and language. No gross focal neurologic deficits are appreciated.  Skin:  Skin is warm, dry. Several shallow scratches to left forearm. One puncture wound with 1 inch area of surrounding ecchymosis.   ____________________________________________   LABS (all labs ordered are listed, but only abnormal results are displayed)  Labs Reviewed - No data to display ____________________________________________  EKG   ____________________________________________  RADIOLOGY  No results found.  ____________________________________________    PROCEDURES  Procedure(s) performed:    Procedures    Medications - No data to display   ____________________________________________   INITIAL IMPRESSION / ASSESSMENT AND PLAN / ED  COURSE  Pertinent labs & imaging results that were available during my care of the patient were reviewed by me and considered in my medical decision making (see chart for details).  Review of the [place] CSRS was performed in accordance of the [facility] prior to dispensing any controlled drugs.   Patient's diagnosis is consistent with cat bite. Vital signs and exam are reassuring. Patient is up-to-date on tetanus. Cats vaccinations are up-to-date and patient is going to confirm rabies vaccination with neighbor tonight. Education about rabies was provided. Patient will be discharged home with prescriptions for Augmentin. Patient is to follow up with PCP as directed. Patient is given ED precautions to return to the ED for any worsening or new symptoms.     ____________________________________________  FINAL CLINICAL IMPRESSION(S) / ED DIAGNOSES  Final diagnoses:  Cat bite, initial encounter      NEW MEDICATIONS STARTED DURING THIS VISIT:  Discharge Medication List as of 09/26/2016  4:32 PM  START taking these medications   Details  amoxicillin-clavulanate (AUGMENTIN) 875-125 MG tablet Take 1 tablet by mouth 2 (two) times daily., Starting Sat 09/26/2016, Until Tue 10/06/2016, Print            This chart was dictated using voice recognition software/Dragon. Despite best efforts to proofread, errors can occur which can change the meaning. Any change was purely unintentional.    Enid Derry, PA-C 09/26/16 1721    Merrily Brittle, MD 09/26/16 [phone]

## 2016-09-26 NOTE — ED Notes (Signed)
See triage note  Was scratched and bitten by neighbors cat   Area noted to left forearm

## 2016-09-26 NOTE — ED Triage Notes (Signed)
States neighbors cat scratched and bit her L forearm approx 2 hours ago. States cat belongs to her neighbor and she thinks it is vaccinated however she does not have neighbors phone number to verify. States happened in [place] city limits.

## 2016-09-30 ENCOUNTER — Encounter: Payer: Self-pay | Admitting: Family Medicine

## 2016-09-30 ENCOUNTER — Ambulatory Visit (INDEPENDENT_AMBULATORY_CARE_PROVIDER_SITE_OTHER): Payer: BLUE CROSS/BLUE SHIELD | Admitting: Family Medicine

## 2016-09-30 VITALS — BP 128/68 | HR 74 | Temp 97.5°F | Resp 16 | Ht 65.0 in | Wt 208.5 lb

## 2016-09-30 DIAGNOSIS — W5501XD Bitten by cat, subsequent encounter: Secondary | ICD-10-CM | POA: Diagnosis not present

## 2016-09-30 DIAGNOSIS — S51852D Open bite of left forearm, subsequent encounter: Secondary | ICD-10-CM | POA: Diagnosis not present

## 2016-09-30 NOTE — Patient Instructions (Signed)
Please call Officer Land with [place] [employer] - [phone] to discuss option to quarantine vs not. He will then contact our office with the decision, and we will determine whether or not you need a rabies vaccination/shot series based on this decision.  Please call me with any questions or concerns.

## 2016-09-30 NOTE — Progress Notes (Addendum)
Name: Tamara Nelson   MRN: [id number]    DOB: 21-Jan-1966   Date:09/30/2016       Progress Note  Subjective  Chief Complaint  Chief Complaint  Patient presents with  . cat scratch    was bitten on Saturday on left forearm animal was not current on vaccinations  . Medication Refill    triamcinolone cream    HPI  Pt presents for ED follow up s/p care for a cat bite. Was given Augmentin and has been taking this as prescribed. She states unsure if cat was up to date on vaccinations - the neighbor who owns the cat said that the cat was UTD, but there is no paperwork to prove this.  Last Tetanus shot 10/02/2015.  She has been watching the cat in her home since the incident because the neighbor had not been taking care of the cat.  Patient Active Problem List   Diagnosis Date Noted  . B12 deficiency 10/23/2015  . Dyslipidemia 10/21/2015  . Bee sting allergy 10/17/2014  . Abnormal serum level of alkaline phosphatase 10/17/2014  . Menopause 10/17/2014  . Irritant contact dermatitis 10/17/2014  . Dysmetabolic syndrome 67/89/3810  . Obesity (BMI 30-39.9) 10/17/2014  . Vitamin D deficiency 10/17/2014  . Allergic rhinitis, seasonal 10/17/2014    Social History  Substance Use Topics  . Smoking status: Never Smoker  . Smokeless tobacco: Never Used  . Alcohol use No     Current Outpatient Prescriptions:  .  ammonium lactate (LAC-HYDRIN) 12 % cream, Apply topically as needed for dry skin., Disp: 385 g, Rfl: 0 .  amoxicillin-clavulanate (AUGMENTIN) 875-125 MG tablet, Take 1 tablet by mouth 2 (two) times daily., Disp: 20 tablet, Rfl: 0 .  azelastine (ASTELIN) 0.1 % nasal spray, Place 2 sprays into both nostrils 2 (two) times daily. Use in each nostril as directed (Patient taking differently: Place 2 sprays into both nostrils 2 (two) times daily as needed. Use in each nostril as directed), Disp: 30 mL, Rfl: 2 .  Cholecalciferol (VITAMIN D) 2000 UNITS CAPS, Take 1 capsule by mouth as  needed., Disp: , Rfl:  .  Cyanocobalamin (B-12) 1000 MCG SUBL, Place 1 tablet under the tongue daily. (Patient not taking: Reported on 09/14/2016), Disp: 30 each, Rfl: 0 .  diphenhydrAMINE (BENADRYL ALLERGY) 25 MG tablet, Take 1 tablet by mouth as needed. Reported on 10/02/2015, Disp: , Rfl:  .  fluticasone (FLONASE) 50 MCG/ACT nasal spray, PLACE 2 SPRAYS INTO BOTH NOSTRILS AS NEEDED FOR RHINITIS., Disp: 16 g, Rfl: 5 .  glucose blood (ONETOUCH VERIO) test strip, Reported on 10/02/2015, Disp: , Rfl:  .  levocetirizine (XYZAL) 5 MG tablet, Take 1 tablet (5 mg total) by mouth every evening. (Patient not taking: Reported on 09/14/2016), Disp: 90 tablet, Rfl: 2 .  Olopatadine HCl 0.2 % SOLN, INSTILL 1 DROP INTO BOTH EYES EVERY MORNING, Disp: , Rfl: 3 .  triamcinolone cream (KENALOG) 0.1 %, Apply topically 2 (two) times daily., Disp: 45 g, Rfl: 1  Allergies  Allergen Reactions  . Bee Venom     ROS  Constitutional: Negative for fever or weight change.  Respiratory: Negative for cough and shortness of breath.   Cardiovascular: Negative for chest pain or palpitations.  Gastrointestinal: Negative for abdominal pain, no bowel changes.  Musculoskeletal: Negative for gait problem or joint swelling.  Skin: Negative for rash. Positive for bruising to left forearm. Neurological: Negative for dizziness or headache.  No other specific complaints in a complete review of  systems (except as listed in HPI above).  Objective  Vitals:   09/30/16 1357  BP: 128/68  Pulse: 74  Resp: 16  Temp: 97.5 F (36.4 C)  SpO2: 94%  Weight: 208 lb 8 oz (94.6 kg)  Height: _0  (1.651 m)    Body mass index is 34.7 kg/m.  Nursing Note and Vital Signs reviewed.  Physical Exam  Constitutional: Patient appears well-developed and well-nourished. Obese No distress.  HEENT: head atraumatic, normocephalic Cardiovascular: Normal rate, regular rhythm, S1/S2 present.  No murmur or rub heard. No BLE edema. Pulmonary/Chest:  Effort normal and breath sounds clear. No respiratory distress or retractions. Skin: Small puncture wound that is in later stages of healing with healing bruise to left forearm.  Psychiatric: Patient has a normal mood and affect. behavior is normal. Judgment and thought content normal.  Recent Results (from the past 2160 hour(s))  Vitamin B12     Status: Abnormal   Collection Time: 08/25/16  4:05 PM  Result Value Ref Range   Vitamin B-12 1,369 (H) 232 - 1,245 pg/mL  CBC with Differential/Platelet     Status: None   Collection Time: 09/14/16  2:43 PM  Result Value Ref Range   WBC 7.8 3.8 - 10.8 K/uL   RBC 4.38 3.80 - 5.10 MIL/uL   Hemoglobin 12.6 11.7 - 15.5 g/dL   HCT 38.9 35.0 - 45.0 %   MCV 88.8 80.0 - 100.0 fL   MCH 28.8 27.0 - 33.0 pg   MCHC 32.4 32.0 - 36.0 g/dL   RDW 14.2 11.0 - 15.0 %   Platelets 271 140 - 400 K/uL   MPV 9.2 7.5 - 12.5 fL   Neutro Abs 4,680 1,500 - 7,800 cells/uL   Lymphs Abs 2,574 850 - 3,900 cells/uL   Monocytes Absolute 390 200 - 950 cells/uL   Eosinophils Absolute 156 15 - 500 cells/uL   Basophils Absolute 0 0 - 200 cells/uL   Neutrophils Relative % 60 %   Lymphocytes Relative 33 %   Monocytes Relative 5 %   Eosinophils Relative 2 %   Basophils Relative 0 %   Smear Review Criteria for review not met   COMPLETE METABOLIC PANEL WITH GFR     Status: None   Collection Time: 09/14/16  2:43 PM  Result Value Ref Range   Sodium 138 135 - 146 mmol/L   Potassium 3.9 3.5 - 5.3 mmol/L   Chloride 104 98 - 110 mmol/L   CO2 29 20 - 31 mmol/L   Glucose, Bld 92 65 - 99 mg/dL   BUN 15 7 - 25 mg/dL   Creat 0.93 0.50 - 1.05 mg/dL    Comment:   For patients > or = 50 years of age: The upper reference limit for Creatinine is approximately 13% higher for people identified as African-American.      Total Bilirubin 0.3 0.2 - 1.2 mg/dL   Alkaline Phosphatase 119 33 - 130 U/L   AST 15 10 - 35 U/L   ALT 17 6 - 29 U/L   Total Protein 6.7 6.1 - 8.1 g/dL   Albumin  3.9 3.6 - 5.1 g/dL   Calcium 8.9 8.6 - 10.4 mg/dL   GFR, Est African American 83 >=60 mL/min   GFR, Est Non African American 72 >=60 mL/min  Hemoglobin A1c     Status: Abnormal   Collection Time: 09/14/16  2:43 PM  Result Value Ref Range   Hgb A1c MFr Bld 5.7 (H) <5.7 %  Comment:   For someone without known diabetes, a hemoglobin A1c value between 5.7% and 6.4% is consistent with prediabetes and should be confirmed with a follow-up test.   For someone with known diabetes, a value <7% indicates that their diabetes is well controlled. A1c targets should be individualized based on duration of diabetes, age, co-morbid conditions and other considerations.   This assay result is consistent with an increased risk of diabetes.   Currently, no consensus exists regarding use of hemoglobin A1c for diagnosis of diabetes in children.      Mean Plasma Glucose 117 mg/dL  Insulin, fasting     Status: Abnormal   Collection Time: 09/14/16  2:43 PM  Result Value Ref Range   Insulin fasting, serum 85.6 (H) 2.0 - 19.6 uIU/mL    Comment:   This insulin assay shows strong cross-reactivity for some insulin analogs (lispro, aspart, and glargine) and much lower cross-reactivity with others (detemir, glulisine).   Stimulated Insulin reference intervals were established using the Siemens Immulite assay. These values are provided for general guidance only.   TSH     Status: None   Collection Time: 09/14/16  2:43 PM  Result Value Ref Range   TSH 1.81 mIU/L    Comment:   Reference Range   > or = 20 Years  0.40-4.50   Pregnancy Range First trimester  0.26-2.66 Second trimester 0.55-2.73 Third trimester  0.43-2.91     Vitamin B12     Status: None   Collection Time: 09/14/16  2:43 PM  Result Value Ref Range   Vitamin B-12 698 200 - 1,100 pg/mL  VITAMIN D 25 Hydroxy (Vit-D Deficiency, Fractures)     Status: Abnormal   Collection Time: 09/14/16  2:43 PM  Result Value Ref Range   Vit D,  25-Hydroxy 26 (L) 30 - 100 ng/mL    Comment: Vitamin D Status           25-OH Vitamin D        Deficiency                <20 ng/mL        Insufficiency         20 - 29 ng/mL        Optimal             > or = 30 ng/mL   For 25-OH Vitamin D testing on patients on D2-supplementation and patients for whom quantitation of D2 and D3 fractions is required, the QuestAssureD 25-OH VIT D, (D2,D3), LC/MS/MS is recommended: order code [phone] (patients > 2 yrs).   Sedimentation rate     Status: None   Collection Time: 09/14/16  2:43 PM  Result Value Ref Range   Sed Rate 12 0 - 20 mm/hr  C-reactive protein     Status: None   Collection Time: 09/14/16  2:43 PM  Result Value Ref Range   CRP 7.8 <8.0 mg/L     Assessment & Plan  1. Cat bite of left forearm, subsequent encounter Placed call to [employer] to seek recommendation on quarantine vs Rabies IgG administration: They are Investigating this issue today, will speak with the patient, and will call the office with the decision of whether or not they quarantined or found rabies vaccination documentation, and we will determine plan of care after this. Patient is aware and is given the officer's information.   -Red flags and when to present for emergency care or RTC including fever >  101.36F, chest pain, shortness of breath, new/worsening/un-resolving symptoms, increased thirst, headache, increased redness/swelling at wound reviewed with patient at time of visit. Follow up and care instructions discussed and provided in AVS.  I have reviewed this encounter including the documentation in this note and/or discussed this patient with the Johney Maine, FNP, NP-C. I am certifying that I agree with the content of this note as supervising physician.  Steele Sizer, MD [facility] 10/11/2016, 11:31 AM

## 2016-10-23 ENCOUNTER — Encounter: Payer: Self-pay | Admitting: Family Medicine

## 2016-10-23 ENCOUNTER — Ambulatory Visit (INDEPENDENT_AMBULATORY_CARE_PROVIDER_SITE_OTHER): Payer: BLUE CROSS/BLUE SHIELD | Admitting: Family Medicine

## 2016-10-23 ENCOUNTER — Other Ambulatory Visit: Payer: Self-pay | Admitting: Family Medicine

## 2016-10-23 VITALS — BP 124/68 | HR 77 | Temp 97.6°F | Resp 16 | Ht 65.0 in | Wt 205.2 lb

## 2016-10-23 DIAGNOSIS — Z124 Encounter for screening for malignant neoplasm of cervix: Secondary | ICD-10-CM

## 2016-10-23 DIAGNOSIS — Z01419 Encounter for gynecological examination (general) (routine) without abnormal findings: Secondary | ICD-10-CM

## 2016-10-23 DIAGNOSIS — Z1231 Encounter for screening mammogram for malignant neoplasm of breast: Secondary | ICD-10-CM

## 2016-10-23 DIAGNOSIS — E669 Obesity, unspecified: Secondary | ICD-10-CM | POA: Diagnosis not present

## 2016-10-23 DIAGNOSIS — E8881 Metabolic syndrome: Secondary | ICD-10-CM | POA: Diagnosis not present

## 2016-10-23 DIAGNOSIS — E785 Hyperlipidemia, unspecified: Secondary | ICD-10-CM | POA: Diagnosis not present

## 2016-10-23 NOTE — Patient Instructions (Signed)
Preventive Care 40-64 Years, Female Preventive care refers to lifestyle choices and visits with your health care provider that can promote health and wellness. What does preventive care include?  A yearly physical exam. This is also called an annual well check.  Dental exams once or twice a year.  Routine eye exams. Ask your health care provider how often you should have your eyes checked.  Personal lifestyle choices, including: ? Daily care of your teeth and gums. ? Regular physical activity. ? Eating a healthy diet. ? Avoiding tobacco and drug use. ? Limiting alcohol use. ? Practicing safe sex. ? Taking low-dose aspirin daily starting at age 58. ? Taking vitamin and mineral supplements as recommended by your health care provider. What happens during an annual well check? The services and screenings done by your health care provider during your annual well check will depend on your age, overall health, lifestyle risk factors, and family history of disease. Counseling Your health care provider may ask you questions about your:  Alcohol use.  Tobacco use.  Drug use.  Emotional well-being.  Home and relationship well-being.  Sexual activity.  Eating habits.  Work and work Statistician.  Method of birth control.  Menstrual cycle.  Pregnancy history.  Screening You may have the following tests or measurements:  Height, weight, and BMI.  Blood pressure.  Lipid and cholesterol levels. These may be checked every 5 years, or more frequently if you are over 81 years old.  Skin check.  Lung cancer screening. You may have this screening every year starting at age 78 if you have a 30-pack-year history of smoking and currently smoke or have quit within the past 15 years.  Fecal occult blood test (FOBT) of the stool. You may have this test every year starting at age 65.  Flexible sigmoidoscopy or colonoscopy. You may have a sigmoidoscopy every 5 years or a colonoscopy  every 10 years starting at age 30.  Hepatitis C blood test.  Hepatitis B blood test.  Sexually transmitted disease (STD) testing.  Diabetes screening. This is done by checking your blood sugar (glucose) after you have not eaten for a while (fasting). You may have this done every 1-3 years.  Mammogram. This may be done every 1-2 years. Talk to your health care provider about when you should start having regular mammograms. This may depend on whether you have a family history of breast cancer.  BRCA-related cancer screening. This may be done if you have a family history of breast, ovarian, tubal, or peritoneal cancers.  Pelvic exam and Pap test. This may be done every 3 years starting at age 80. Starting at age 36, this may be done every 5 years if you have a Pap test in combination with an HPV test.  Bone density scan. This is done to screen for osteoporosis. You may have this scan if you are at high risk for osteoporosis.  Discuss your test results, treatment options, and if necessary, the need for more tests with your health care provider. Vaccines Your health care provider may recommend certain vaccines, such as:  Influenza vaccine. This is recommended every year.  Tetanus, diphtheria, and acellular pertussis (Tdap, Td) vaccine. You may need a Td booster every 10 years.  Varicella vaccine. You may need this if you have not been vaccinated.  Zoster vaccine. You may need this after age 5.  Measles, mumps, and rubella (MMR) vaccine. You may need at least one dose of MMR if you were born in  1956 or later. You may also need a second dose.  Pneumococcal 13-valent conjugate (PCV13) vaccine. You may need this if you have certain conditions and were not previously vaccinated.  Pneumococcal polysaccharide (PPSV23) vaccine. You may need one or two doses if you smoke cigarettes or if you have certain conditions.  Meningococcal vaccine. You may need this if you have certain  conditions.  Hepatitis A vaccine. You may need this if you have certain conditions or if you travel or work in places where you may be exposed to hepatitis A.  Hepatitis B vaccine. You may need this if you have certain conditions or if you travel or work in places where you may be exposed to hepatitis B.  Haemophilus influenzae type b (Hib) vaccine. You may need this if you have certain conditions.  Talk to your health care provider about which screenings and vaccines you need and how often you need them. This information is not intended to replace advice given to you by your health care provider. Make sure you discuss any questions you have with your health care provider. Document Released: 05/17/2015 Document Revised: 01/08/2016 Document Reviewed: 02/19/2015 Elsevier Interactive Patient Education  2017 [employer].

## 2016-10-23 NOTE — Progress Notes (Signed)
Name: Tamara Nelson   MRN: [id number]    DOB: 1965-05-20   Date:10/23/2016       Progress Note  Subjective  Chief Complaint  Chief Complaint  Patient presents with  . Annual Exam    HPI   Well woman ;she is not sexually active for many years, she denies bladder issues, colonoscopy is up to date, also mammogram. Insulin resistance, with very high insulin but she is not sure if she was fasting and wants to recheck levels. Denies breast lumps or nipple discharge. LMP for years, post-menopausal.   Patient Active Problem List   Diagnosis Date Noted  . B12 deficiency 10/23/2015  . Dyslipidemia 10/21/2015  . Bee sting allergy 10/17/2014  . Abnormal serum level of alkaline phosphatase 10/17/2014  . Menopause 10/17/2014  . Irritant contact dermatitis 10/17/2014  . Dysmetabolic syndrome 48/18/5909  . Obesity (BMI 30-39.9) 10/17/2014  . Vitamin D deficiency 10/17/2014  . Allergic rhinitis, seasonal 10/17/2014    History reviewed. No pertinent surgical history.  Family History  Problem Relation Age of Onset  . Cancer Mother        colon  . Cancer Father        Liver and Lung  . Stroke Father   . Hypertension Brother   . Diabetes Brother   . Breast cancer Neg Hx     Social History   Social History  . Marital status: Single    Spouse name: N/A  . Number of children: N/A  . Years of education: N/A   Occupational History  . lab technician  [place] History Main Topics  . Smoking status: Never Smoker  . Smokeless tobacco: Never Used  . Alcohol use No  . Drug use: No  . Sexual activity: No   Other Topics Concern  . Not on file   Social History Narrative   Lives alone, works at [employer] during the day and for [employer] at night, 5 nights weekly      Current Outpatient Prescriptions:  .  ammonium lactate (LAC-HYDRIN) 12 % cream, Apply topically as needed for dry skin., Disp: 385 g, Rfl: 0 .  azelastine (ASTELIN) 0.1 %  nasal spray, Place 2 sprays into both nostrils 2 (two) times daily. Use in each nostril as directed (Patient taking differently: Place 2 sprays into both nostrils 2 (two) times daily as needed. Use in each nostril as directed), Disp: 30 mL, Rfl: 2 .  Cholecalciferol (VITAMIN D) 2000 UNITS CAPS, Take 1 capsule by mouth as needed., Disp: , Rfl:  .  Cyanocobalamin (B-12) 1000 MCG SUBL, Place 1 tablet under the tongue daily. (Patient not taking: Reported on 09/14/2016), Disp: 30 each, Rfl: 0 .  diphenhydrAMINE (BENADRYL ALLERGY) 25 MG tablet, Take 1 tablet by mouth as needed. Reported on 10/02/2015, Disp: , Rfl:  .  fluticasone (FLONASE) 50 MCG/ACT nasal spray, PLACE 2 SPRAYS INTO BOTH NOSTRILS AS NEEDED FOR RHINITIS., Disp: 16 g, Rfl: 5 .  glucose blood (ONETOUCH VERIO) test strip, Reported on 10/02/2015, Disp: , Rfl:  .  levocetirizine (XYZAL) 5 MG tablet, Take 1 tablet (5 mg total) by mouth every evening. (Patient not taking: Reported on 09/14/2016), Disp: 90 tablet, Rfl: 2 .  Olopatadine HCl 0.2 % SOLN, INSTILL 1 DROP INTO BOTH EYES EVERY MORNING, Disp: , Rfl: 3 .  triamcinolone cream (KENALOG) 0.1 %, Apply topically 2 (two) times daily., Disp: 45 g, Rfl: 1  Allergies  Allergen Reactions  . Bee Venom  ROS  Constitutional: Negative for fever or significant  weight change.  Respiratory: Negative for cough and shortness of breath.   Cardiovascular: Negative for chest pain or palpitations.  Gastrointestinal: Negative for abdominal pain, no bowel changes.  Musculoskeletal: Negative for gait problem or joint swelling.  Skin: Negative for rash.  Neurological: Negative for dizziness or headache.  No other specific complaints in a complete review of systems (except as listed in HPI above).  Objective  Vitals:   10/23/16 1359  BP: 124/68  Pulse: 77  Resp: 16  Temp: 97.6 F (36.4 C)  SpO2: 96%  Weight: 205 lb 3 oz (93.1 kg)  Height: _0  (1.651 m)    Body mass index is 34.14  kg/m.  Physical Exam  Constitutional: Patient appears well-developed and obese  No distress.  HENT: Head: Normocephalic and atraumatic. Ears: B TMs ok, no erythema or effusion; Nose: Nose normal. Mouth/Throat: Oropharynx is clear and moist. No oropharyngeal exudate.  Eyes: Conjunctivae and EOM are normal. Pupils are equal, round, and reactive to light. No scleral icterus.  Neck: Normal range of motion. Neck supple. No JVD present. No thyromegaly present.  Cardiovascular: Normal rate, regular rhythm and normal heart sounds.  No murmur heard. No BLE edema. Pulmonary/Chest: Effort normal and breath sounds normal. No respiratory distress. Abdominal: Soft. Bowel sounds are normal, no distension. There is no tenderness. no masses Breast: no lumps or masses, no nipple discharge or rashes FEMALE GENITALIA:  External genitalia normal External urethra normal Pelvic not done RECTAL: not done Musculoskeletal: Normal range of motion, no joint effusions. No gross deformities Neurological: he is alert and oriented to person, place, and time. No cranial nerve deficit. Coordination, balance, strength, speech and gait are normal.  Skin: Skin is warm and dry. No rash noted. No erythema.  Psychiatric: Patient has a normal mood and affect. behavior is normal. Judgment and thought content normal.  Recent Results (from the past 2160 hour(s))  Vitamin B12     Status: Abnormal   Collection Time: 08/25/16  4:05 PM  Result Value Ref Range   Vitamin B-12 1,369 (H) 232 - 1,245 pg/mL  CBC with Differential/Platelet     Status: None   Collection Time: 09/14/16  2:43 PM  Result Value Ref Range   WBC 7.8 3.8 - 10.8 K/uL   RBC 4.38 3.80 - 5.10 MIL/uL   Hemoglobin 12.6 11.7 - 15.5 g/dL   HCT 38.9 35.0 - 45.0 %   MCV 88.8 80.0 - 100.0 fL   MCH 28.8 27.0 - 33.0 pg   MCHC 32.4 32.0 - 36.0 g/dL   RDW 14.2 11.0 - 15.0 %   Platelets 271 140 - 400 K/uL   MPV 9.2 7.5 - 12.5 fL   Neutro Abs 4,680 1,500 - 7,800 cells/uL    Lymphs Abs 2,574 850 - 3,900 cells/uL   Monocytes Absolute 390 200 - 950 cells/uL   Eosinophils Absolute 156 15 - 500 cells/uL   Basophils Absolute 0 0 - 200 cells/uL   Neutrophils Relative % 60 %   Lymphocytes Relative 33 %   Monocytes Relative 5 %   Eosinophils Relative 2 %   Basophils Relative 0 %   Smear Review Criteria for review not met   COMPLETE METABOLIC PANEL WITH GFR     Status: None   Collection Time: 09/14/16  2:43 PM  Result Value Ref Range   Sodium 138 135 - 146 mmol/L   Potassium 3.9 3.5 - 5.3 mmol/L   Chloride  104 98 - 110 mmol/L   CO2 29 20 - 31 mmol/L   Glucose, Bld 92 65 - 99 mg/dL   BUN 15 7 - 25 mg/dL   Creat 0.93 0.50 - 1.05 mg/dL    Comment:   For patients > or = 50 years of age: The upper reference limit for Creatinine is approximately 13% higher for people identified as African-American.      Total Bilirubin 0.3 0.2 - 1.2 mg/dL   Alkaline Phosphatase 119 33 - 130 U/L   AST 15 10 - 35 U/L   ALT 17 6 - 29 U/L   Total Protein 6.7 6.1 - 8.1 g/dL   Albumin 3.9 3.6 - 5.1 g/dL   Calcium 8.9 8.6 - 10.4 mg/dL   GFR, Est African American 83 >=60 mL/min   GFR, Est Non African American 72 >=60 mL/min  Hemoglobin A1c     Status: Abnormal   Collection Time: 09/14/16  2:43 PM  Result Value Ref Range   Hgb A1c MFr Bld 5.7 (H) <5.7 %    Comment:   For someone without known diabetes, a hemoglobin A1c value between 5.7% and 6.4% is consistent with prediabetes and should be confirmed with a follow-up test.   For someone with known diabetes, a value <7% indicates that their diabetes is well controlled. A1c targets should be individualized based on duration of diabetes, age, co-morbid conditions and other considerations.   This assay result is consistent with an increased risk of diabetes.   Currently, no consensus exists regarding use of hemoglobin A1c for diagnosis of diabetes in children.      Mean Plasma Glucose 117 mg/dL  Insulin, fasting      Status: Abnormal   Collection Time: 09/14/16  2:43 PM  Result Value Ref Range   Insulin fasting, serum 85.6 (H) 2.0 - 19.6 uIU/mL    Comment:   This insulin assay shows strong cross-reactivity for some insulin analogs (lispro, aspart, and glargine) and much lower cross-reactivity with others (detemir, glulisine).   Stimulated Insulin reference intervals were established using the Siemens Immulite assay. These values are provided for general guidance only.   TSH     Status: None   Collection Time: 09/14/16  2:43 PM  Result Value Ref Range   TSH 1.81 mIU/L    Comment:   Reference Range   > or = 20 Years  0.40-4.50   Pregnancy Range First trimester  0.26-2.66 Second trimester 0.55-2.73 Third trimester  0.43-2.91     Vitamin B12     Status: None   Collection Time: 09/14/16  2:43 PM  Result Value Ref Range   Vitamin B-12 698 200 - 1,100 pg/mL  VITAMIN D 25 Hydroxy (Vit-D Deficiency, Fractures)     Status: Abnormal   Collection Time: 09/14/16  2:43 PM  Result Value Ref Range   Vit D, 25-Hydroxy 26 (L) 30 - 100 ng/mL    Comment: Vitamin D Status           25-OH Vitamin D        Deficiency                <20 ng/mL        Insufficiency         20 - 29 ng/mL        Optimal             > or = 30 ng/mL   For 25-OH Vitamin D testing on patients on D2-supplementation  and patients for whom quantitation of D2 and D3 fractions is required, the QuestAssureD 25-OH VIT D, (D2,D3), LC/MS/MS is recommended: order code [id number] (patients > 2 yrs).   Sedimentation rate     Status: None   Collection Time: 09/14/16  2:43 PM  Result Value Ref Range   Sed Rate 12 0 - 20 mm/hr  C-reactive protein     Status: None   Collection Time: 09/14/16  2:43 PM  Result Value Ref Range   CRP 7.8 <8.0 mg/L     PHQ2/9: Depression screen [facility] 2/9 09/14/2016 02/18/2016 01/01/2016 10/02/2015 04/15/2015  Decreased Interest 0 0 0 0 0  Down, Depressed, Hopeless 0 0 0 0 0  PHQ - 2 Score 0 0 0 0 0    Fall  Risk: Fall Risk  09/14/2016 02/18/2016 01/01/2016 10/02/2015 04/15/2015  Falls in the past year? _0       Assessment & Plan  1. Well woman exam  Discussed importance of 150 minutes of physical activity weekly, eat two servings of fish weekly, eat one serving of tree nuts ( cashews, pistachios, pecans, almonds.Marland Kitchen) every other day, eat 6 servings of fruit/vegetables daily and drink plenty of water and avoid sweet beverages.   2. Cervical cancer screening  Up to date  3. Insulin resistance  - Insulin, fasting She denies family history of thyroid cancer, no personal history of pancreatitis, discussed GLP-1 and she will think about it.   4. Dyslipidemia  - Lipid panel   5. Obesity (BMI 30.0-34.9)  Discussed with the patient the risk posed by an increased BMI. Discussed importance of portion control, calorie counting and at least 150 minutes of physical activity weekly. Avoid sweet beverages and drink more water. Eat at least 6 servings of fruit and vegetables daily

## 2016-10-26 DIAGNOSIS — E8881 Metabolic syndrome: Secondary | ICD-10-CM | POA: Diagnosis not present

## 2016-10-26 DIAGNOSIS — E785 Hyperlipidemia, unspecified: Secondary | ICD-10-CM | POA: Diagnosis not present

## 2016-10-26 LAB — LIPID PANEL
CHOL/HDL RATIO: 4.2 ratio (ref <5.0)
Cholesterol: 160 mg/dL (ref <200)
HDL: 38 mg/dL — ABNORMAL LOW (ref >50)
LDL CALC: 107 mg/dL — AB (ref <100)
TRIGLYCERIDES: 75 mg/dL (ref <150)
VLDL: 15 mg/dL (ref <30)

## 2016-10-27 LAB — INSULIN, FASTING: INSULIN FASTING, SERUM: 12.6 u[IU]/mL (ref 2.0–19.6)

## 2016-10-30 ENCOUNTER — Telehealth: Payer: Self-pay | Admitting: Family Medicine

## 2016-10-30 NOTE — Telephone Encounter (Signed)
Pt requesting lab results please return call [phone]

## 2016-12-01 ENCOUNTER — Encounter: Payer: Self-pay | Admitting: Family Medicine

## 2016-12-01 ENCOUNTER — Ambulatory Visit (INDEPENDENT_AMBULATORY_CARE_PROVIDER_SITE_OTHER): Payer: BLUE CROSS/BLUE SHIELD | Admitting: Family Medicine

## 2016-12-01 VITALS — BP 126/74 | HR 67 | Temp 97.9°F | Resp 16 | Wt 205.4 lb

## 2016-12-01 DIAGNOSIS — E66811 Obesity, class 1: Secondary | ICD-10-CM

## 2016-12-01 DIAGNOSIS — E669 Obesity, unspecified: Secondary | ICD-10-CM

## 2016-12-01 DIAGNOSIS — E8881 Metabolic syndrome: Secondary | ICD-10-CM | POA: Diagnosis not present

## 2016-12-01 DIAGNOSIS — E785 Hyperlipidemia, unspecified: Secondary | ICD-10-CM

## 2016-12-01 DIAGNOSIS — E88819 Insulin resistance, unspecified: Secondary | ICD-10-CM

## 2016-12-01 DIAGNOSIS — R21 Rash and other nonspecific skin eruption: Secondary | ICD-10-CM | POA: Diagnosis not present

## 2016-12-01 MED ORDER — PREDNISONE 10 MG PO TABS: 10.0000 mg | ORAL_TABLET | Freq: Four times a day (QID) | ORAL | 0 refills | Status: DC

## 2016-12-01 MED ORDER — TRIAMCINOLONE ACETONIDE 0.1 % EX CREA: TOPICAL_CREAM | Freq: Two times a day (BID) | CUTANEOUS | 1 refills | Status: DC

## 2016-12-01 NOTE — Patient Instructions (Signed)
Poison Oak Dermatitis Poison oak dermatitis is inflammation of the skin that is caused by contact with the allergens on the leaves of the poison oak (toxicodendron) plant. The skin reaction often includes redness, swelling, blisters, and extreme itching. What are the causes? This condition is caused by a specific chemical (urushiol) that is found in the sap of the poison oak plant. This chemical is sticky and it can be easily spread to people, animals, and objects. You can get poison oak dermatitis by:  Having direct contact with a poison oak plant.  Touching animals, other people, or objects that have come in contact with poison oak and have the chemical on them.  What increases the risk? This condition is more likely to develop in people who:  Are outdoors often.  Go outdoors without wearing protective clothing, such as closed shoes, long pants, and a long-sleeved shirt.  What are the signs or symptoms? Symptoms of this condition include:  Redness of the skin.  A rash that may develop blisters.  Extreme itching.  Swelling. This may occur if the reaction is more severe.  Symptoms usually last for 1-2 weeks. However, the first time you develop this condition, symptoms may last 3-4 weeks. How is this diagnosed? This condition may be diagnosed based on your symptoms and a physical exam. Your health care provider may also ask you about any recent outdoor activity. How is this treated? Treatment for this condition will vary depending on how severe it is. Treatment may include:  Hydrocortisone creams or calamine lotions to relieve itching.  Oatmeal baths to soothe the skin.  Over-the-counter antihistamine tablets.  Oral steroid medicine for more severe outbreaks.  Follow these instructions at home:  Take or apply over-the-counter and prescription medicines only as told by your health care provider.  Wash exposed skin as soon as possible with soap and cold water.  Use  hydrocortisone creams or calamine lotion as needed to soothe the skin and relieve itching.  Take oatmeal baths as needed. Use colloidal oatmeal. You can get this at your local pharmacy or grocery store. Follow the instructions on the packaging.  Do not scratch or rub your skin.  While you have the rash, wash clothes right after you wear them. How is this prevented?  Learn to identify the poison oak plant and avoid contact with the plant. This plant can be recognized by the number of leaves. Generally, poison oak has three leaves with flowering branches on a single stem. The leaves are often a bit fuzzy and have a toothlike edge.  If you have been exposed to poison oak, thoroughly wash with soap and water right away. You have about 30 minutes to remove the plant resin before it will cause the rash. Be sure to wash under your fingernails because any plant resin there will continue to spread the rash.  When hiking or camping, wear clothes that will help you avoid exposure on the skin. This includes long pants, a long-sleeved shirt, tall socks, and hiking boots. You can also apply preventive lotion to your skin to help limit exposure.  If you suspect that your clothes or outdoor gear came in contact with poison oak, rinse them off outside with a garden hose before bringing them inside your house. Contact a health care provider if:  You have open sores in the rash area.  You have more redness, swelling, or pain in the affected area.  You have redness that spreads beyond the rash area.  You have   fluid, blood, or pus coming from the affected area.  You have a fever.  You have a rash over a large area of your body.  You have a rash on your eyes, mouth, or genitals.  Your rash does not improve after a few days. Get help right away if:  Your face swells or your eyes swell shut.  You have trouble breathing.  You have trouble swallowing. This information is not intended to replace advice  given to you by your health care provider. Make sure you discuss any questions you have with your health care provider. Document Released: 10/25/2002 Document Revised: 09/26/2015 Document Reviewed: 09/26/2014 Elsevier Interactive Patient Education  2018 [employer]  Pityriasis Rosea Pityriasis rosea is a rash that usually appears on the trunk of the body. It may also appear on the upper arms and upper legs. It usually begins as a single patch, and then more patches begin to develop. The rash may cause mild itching, but it normally does not cause other problems. It usually goes away without treatment. However, it may take weeks or months for the rash to go away completely. What are the causes? The cause of this condition is not known. The condition does not spread from person to person (is noncontagious). What increases the risk? This condition is more likely to develop in young adults and children. It is most common in the spring and fall. What are the signs or symptoms? The main symptom of this condition is a rash.  The rash usually begins with a single oval patch that is larger than the ones that follow. This is called a herald patch. It generally appears a week or more before the rest of the rash appears.  When more patches start to develop, they spread quickly on the trunk, back, and arms. These patches are smaller than the first one.  The patches that make up the rash are usually oval-shaped and pink or red in color. They are usually flat, but they may sometimes be raised so that they can be felt with a finger. They may also be finely crinkled and have a scaly ring around the edge.  The rash does not typically appear on areas of the skin that are exposed to the sun.  Most people who have this condition do not have other symptoms, but some have mild itching. In a few cases, a mild headache or body aches may occur before the rash appears and then go away. How is this diagnosed? Your  health care provider may diagnose this condition by doing a physical exam and taking your medical history. To rule out other possible causes for the rash, the health care provider may order blood tests or take a skin sample from the rash to be looked at under a microscope. How is this treated? Usually, treatment is not needed for this condition. The rash will probably go away on its own in 4-8 weeks. In some cases, a health care provider may recommend or prescribe medicine to reduce itching. Follow these instructions at home:  Take medicines only as directed by your health care provider.  Avoid scratching the affected areas of skin.  Do not take hot baths or use a sauna. Use only warm water when bathing or showering. Heat can increase itching. Contact a health care provider if:  Your rash does not go away in 8 weeks.  Your rash gets much worse.  You have a fever.  You have swelling or pain in the rash  area.  You have fluid, blood, or pus coming from the rash area. This information is not intended to replace advice given to you by your health care provider. Make sure you discuss any questions you have with your health care provider. Document Released: 05/27/2001 Document Revised: 09/26/2015 Document Reviewed: 03/28/2014 Elsevier Interactive Patient Education  [employer]2018 [employer]

## 2016-12-01 NOTE — Progress Notes (Signed)
Name: Tamara Nelson   MRN: [id number]    DOB: 24-Nov-1965   Date:12/01/2016       Progress Note  Subjective  Chief Complaint  Chief Complaint  Patient presents with  . 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 She states this time she started with a rash on her back and has spread to her arms, worse spot in on left  hand/wrist, itchy at times, mostly irritated now - worse when she feels hot. Present for weeks now and is not improving. No fever, chills, change in appetite. She would like a refill of Triamcinolone cream  Insulin resistance/Obesity: discussed importance of stopping drinking sugary drinks. She denies polyphagia, polydipsia or polyuria. Discussed importance of weight loss. She has lost 6 lbs over the past 5 months.   Dyslipidemia: discussed ways to bring HDL up, she states she does not like to eat fish  B12 deficiency: resume B12 three times a week    Patient Active Problem List   Diagnosis Date Noted  . B12 deficiency 10/23/2015  . Dyslipidemia 10/21/2015  . Bee sting allergy 10/17/2014  . Abnormal serum level of alkaline phosphatase 10/17/2014  . Menopause 10/17/2014  . Irritant contact dermatitis 10/17/2014  . Dysmetabolic syndrome 10/17/2014  . Obesity (BMI 30-39.9) 10/17/2014  . Vitamin D deficiency 10/17/2014  . Allergic rhinitis, seasonal 10/17/2014    No past surgical history on file.  Family History  Problem Relation Age of Onset  . Cancer Mother        colon  . Cancer Father        Liver and Lung  . Stroke Father   . Hypertension Brother   . Diabetes Brother   . Breast cancer Neg Hx     Social History   Social History  . Marital status: Single    Spouse name: N/A  . Number of children: N/A  . Years of education: N/A   Occupational History  . lab technician   [employer]   Social History Main Topics  . Smoking status: Never Smoker  . Smokeless tobacco: Never Used  . Alcohol use No  . Drug use: No  . Sexual activity: No   Other Topics Concern  . Not on file   Social History Narrative   Lives alone, works at [employer] during the day and for [employer] at night, 5 nights weekly      Current Outpatient Prescriptions:  .  ammonium lactate (LAC-HYDRIN) 12 % cream, Apply topically as needed for dry skin., Disp: 385 g, Rfl: 0 .  azelastine (ASTELIN) 0.1 % nasal spray, Place 2 sprays into both nostrils 2 (two) times daily. Use in each nostril as directed (Patient taking differently: Place 2 sprays into both nostrils 2 (two) times daily as needed. Use in each nostril as directed), Disp: 30 mL, Rfl: 2 .  Cholecalciferol (VITAMIN D) 2000 UNITS CAPS, Take 1 capsule by mouth as needed., Disp: , Rfl:  .  Cyanocobalamin (B-12) 1000 MCG SUBL, Place 1 tablet under the tongue daily., Disp: 30 each, Rfl: 0 .  diphenhydrAMINE (BENADRYL ALLERGY) 25 MG tablet, Take 1 tablet by mouth as needed. Reported on 10/02/2015, Disp: , Rfl:  .  fluticasone (FLONASE) 50 MCG/ACT nasal spray, PLACE 2 SPRAYS  INTO BOTH NOSTRILS AS NEEDED FOR RHINITIS., Disp: 16 g, Rfl: 5 .  glucose blood (ONETOUCH VERIO) test strip, Reported on 10/02/2015, Disp: , Rfl:  .  levocetirizine (XYZAL) 5 MG tablet, Take 1 tablet (5 mg total) by mouth every evening., Disp: 90 tablet, Rfl: 2 .  Olopatadine HCl 0.2 % SOLN, INSTILL 1 DROP INTO BOTH EYES EVERY MORNING, Disp: , Rfl: 3 .  triamcinolone cream (KENALOG) 0.1 %, Apply topically 2 (two) times daily., Disp: 80 g, Rfl: 1 .  predniSONE (DELTASONE) 10 MG tablet, Take 1 tablet (10 mg total) by mouth 4 (four) times daily. Prednisone taper as directed, Disp: 42 tablet, Rfl: 0  Allergies  Allergen Reactions  . Bee Venom      ROS  Constitutional: Negative for fever or weight change.  Respiratory: Negative for cough and  shortness of breath.   Cardiovascular: Negative for chest pain or palpitations.  Gastrointestinal: Negative for abdominal pain, no bowel changes.  Musculoskeletal: Negative for gait problem or joint swelling.  Skin: Positive for rash.  Neurological: Negative for dizziness or headache.  No other specific complaints in a complete review of systems (except as listed in HPI above).   Objective  Vitals:   12/01/16 1425  BP: 126/74  Pulse: 67  Resp: 16  Temp: 97.9 F (36.6 C)  SpO2: 99%  Weight: 205 lb 6 oz (93.2 kg)    Body mass index is 34.18 kg/m.  Physical Exam  Constitutional: Patient appears well-developed and well-nourished. Obese  No distress.  HEENT: head atraumatic, normocephalic, pupils equal and reactive to light, neck supple, throat within normal limits Cardiovascular: Normal rate, regular rhythm and normal heart sounds.  No murmur heard. No BLE edema. Pulmonary/Chest: Effort normal and breath sounds normal. No respiratory distress. Abdominal: Soft.  There is no tenderness. Skin: she has erythematous rash , round and over 10 inches on her lower back, also has multiple round lesions on arms, in clusters, but worse on left medial wrist, erythematous base, no oozing.  Psychiatric: Patient has a normal mood and affect. behavior is normal. Judgment and thought content normal.   Recent Results (from the past 2160 hour(s))  CBC with Differential/Platelet     Status: None   Collection Time: 09/14/16  2:43 PM  Result Value Ref Range   WBC 7.8 3.8 - 10.8 K/uL   RBC 4.38 3.80 - 5.10 MIL/uL   Hemoglobin 12.6 11.7 - 15.5 g/dL   HCT 38.9 35.0 - 45.0 %   MCV 88.8 80.0 - 100.0 fL   MCH 28.8 27.0 - 33.0 pg   MCHC 32.4 32.0 - 36.0 g/dL   RDW 14.2 11.0 - 15.0 %   Platelets 271 140 - 400 K/uL   MPV 9.2 7.5 - 12.5 fL   Neutro Abs 4,680 1,500 - 7,800 cells/uL   Lymphs Abs 2,574 850 - 3,900 cells/uL   Monocytes Absolute 390 200 - 950 cells/uL   Eosinophils Absolute 156 15 - 500  cells/uL   Basophils Absolute 0 0 - 200 cells/uL   Neutrophils Relative % 60 %   Lymphocytes Relative 33 %   Monocytes Relative 5 %   Eosinophils Relative 2 %   Basophils Relative 0 %   Smear Review Criteria for review not met   COMPLETE METABOLIC PANEL WITH GFR     Status: None   Collection Time: 09/14/16  2:43 PM  Result Value Ref Range   Sodium 138 135 - 146 mmol/L   Potassium 3.9 3.5 -  5.3 mmol/L   Chloride 104 98 - 110 mmol/L   CO2 29 20 - 31 mmol/L   Glucose, Bld 92 65 - 99 mg/dL   BUN 15 7 - 25 mg/dL   Creat 0.93 0.50 - 1.05 mg/dL    Comment:   For patients > or = 50 years of age: The upper reference limit for Creatinine is approximately 13% higher for people identified as African-American.      Total Bilirubin 0.3 0.2 - 1.2 mg/dL   Alkaline Phosphatase 119 33 - 130 U/L   AST 15 10 - 35 U/L   ALT 17 6 - 29 U/L   Total Protein 6.7 6.1 - 8.1 g/dL   Albumin 3.9 3.6 - 5.1 g/dL   Calcium 8.9 8.6 - 10.4 mg/dL   GFR, Est African American 83 >=60 mL/min   GFR, Est Non African American 72 >=60 mL/min  Hemoglobin A1c     Status: Abnormal   Collection Time: 09/14/16  2:43 PM  Result Value Ref Range   Hgb A1c MFr Bld 5.7 (H) <5.7 %    Comment:   For someone without known diabetes, a hemoglobin A1c value between 5.7% and 6.4% is consistent with prediabetes and should be confirmed with a follow-up test.   For someone with known diabetes, a value <7% indicates that their diabetes is well controlled. A1c targets should be individualized based on duration of diabetes, age, co-morbid conditions and other considerations.   This assay result is consistent with an increased risk of diabetes.   Currently, no consensus exists regarding use of hemoglobin A1c for diagnosis of diabetes in children.      Mean Plasma Glucose 117 mg/dL  Insulin, fasting     Status: Abnormal   Collection Time: 09/14/16  2:43 PM  Result Value Ref Range   Insulin fasting, serum 85.6 (H) 2.0 - 19.6  uIU/mL    Comment:   This insulin assay shows strong cross-reactivity for some insulin analogs (lispro, aspart, and glargine) and much lower cross-reactivity with others (detemir, glulisine).   Stimulated Insulin reference intervals were established using the Siemens Immulite assay. These values are provided for general guidance only.   TSH     Status: None   Collection Time: 09/14/16  2:43 PM  Result Value Ref Range   TSH 1.81 mIU/L    Comment:   Reference Range   > or = 20 Years  0.40-4.50   Pregnancy Range First trimester  0.26-2.66 Second trimester 0.55-2.73 Third trimester  0.43-2.91     Vitamin B12     Status: None   Collection Time: 09/14/16  2:43 PM  Result Value Ref Range   Vitamin B-12 698 200 - 1,100 pg/mL  VITAMIN D 25 Hydroxy (Vit-D Deficiency, Fractures)     Status: Abnormal   Collection Time: 09/14/16  2:43 PM  Result Value Ref Range   Vit D, 25-Hydroxy 26 (L) 30 - 100 ng/mL    Comment: Vitamin D Status           25-OH Vitamin D        Deficiency                <20 ng/mL        Insufficiency         20 - 29 ng/mL        Optimal             > or = 30 ng/mL   For 25-OH Vitamin D  testing on patients on D2-supplementation and patients for whom quantitation of D2 and D3 fractions is required, the QuestAssureD 25-OH VIT D, (D2,D3), LC/MS/MS is recommended: order code [id number] (patients > 2 yrs).   Sedimentation rate     Status: None   Collection Time: 09/14/16  2:43 PM  Result Value Ref Range   Sed Rate 12 0 - 20 mm/hr  C-reactive protein     Status: None   Collection Time: 09/14/16  2:43 PM  Result Value Ref Range   CRP 7.8 <8.0 mg/L  Insulin, fasting     Status: None   Collection Time: 10/26/16  8:30 AM  Result Value Ref Range   Insulin fasting, serum 12.6 2.0 - 19.6 uIU/mL    Comment:   This insulin assay shows strong cross-reactivity for some insulin analogs (lispro, aspart, and glargine) and much lower cross-reactivity with others (detemir,  glulisine).   Stimulated Insulin reference intervals were established using the Siemens Immulite assay. These values are provided for general guidance only.   Lipid panel     Status: Abnormal   Collection Time: 10/26/16  8:30 AM  Result Value Ref Range   Cholesterol 160 <200 mg/dL   Triglycerides 75 <150 mg/dL   HDL 38 (L) >50 mg/dL   Total CHOL/HDL Ratio 4.2 <5.0 Ratio   VLDL 15 <30 mg/dL   LDL Cholesterol 107 (H) <100 mg/dL    PHQ2/9: Depression screen [facility] 2/9 09/14/2016 02/18/2016 01/01/2016 10/02/2015 04/15/2015  Decreased Interest 0 0 0 0 0  Down, Depressed, Hopeless 0 0 0 0 0  PHQ - 2 Score 0 0 0 0 0     Fall Risk: Fall Risk  09/14/2016 02/18/2016 01/01/2016 10/02/2015 04/15/2015  Falls in the past year? No No No No No      Assessment & Plan  1. Rash  Discussed possible side effects of medication and importance of taking medication with food - triamcinolone cream (KENALOG) 0.1 %; Apply topically 2 (two) times daily.  Dispense: 80 g; Refill: 1 - predniSONE (DELTASONE) 10 MG tablet; Take 1 tablet (10 mg total) by mouth 4 (four) times daily. Prednisone taper as directed  Dispense: 42 tablet; Refill: 0  2. Dyslipidemia  Lipid panel shows low HDL : to improve HDL patient  needs to eat tree nuts ( pecans/pistachios/almonds ) four times weekly, eat fish two times weekly  and exercise  at least 150 minutes per week  3. Insulin resistance  Discussed diabetes diet, she cooks healthy meals, but drinks at least 40 ounces of [facility] daily   4. Obesity (BMI 30.0-34.9)  Discussed with the patient the risk posed by an increased BMI. Discussed importance of portion control, calorie counting and at least 150 minutes of physical activity weekly. Avoid sweet beverages and drink more water. Eat at least 6 servings of fruit and vegetables daily

## 2017-01-25 ENCOUNTER — Ambulatory Visit
Admission: RE | Admit: 2017-01-25 | Discharge: 2017-01-25 | Disposition: A | Discharge status: Home / Self Care | Payer: BLUE CROSS/BLUE SHIELD | Source: Ambulatory Visit | Attending: Family Medicine | Admitting: Family Medicine

## 2017-01-25 DIAGNOSIS — Z1231 Encounter for screening mammogram for malignant neoplasm of breast: Secondary | ICD-10-CM | POA: Diagnosis not present

## 2017-02-04 DIAGNOSIS — Z23 Encounter for immunization: Secondary | ICD-10-CM | POA: Diagnosis not present

## 2017-02-15 ENCOUNTER — Ambulatory Visit: Payer: BLUE CROSS/BLUE SHIELD | Admitting: Family Medicine

## 2017-04-20 ENCOUNTER — Other Ambulatory Visit: Payer: Self-pay | Admitting: Family Medicine

## 2017-04-20 NOTE — Telephone Encounter (Signed)
Refill request for general medication: Flonase  Last office visit: 12/01/2016  Last physical exam: 06//22/2018  Follow up visit:  06/08/2017

## 2017-05-07 ENCOUNTER — Other Ambulatory Visit: Payer: Self-pay | Admitting: Family Medicine

## 2017-05-12 ENCOUNTER — Encounter: Payer: Self-pay | Admitting: Family Medicine

## 2017-05-12 ENCOUNTER — Ambulatory Visit: Payer: BLUE CROSS/BLUE SHIELD | Admitting: Family Medicine

## 2017-05-12 ENCOUNTER — Other Ambulatory Visit: Payer: Self-pay | Admitting: Family Medicine

## 2017-05-12 VITALS — BP 136/72 | HR 83 | Temp 99.0°F | Ht 65.0 in | Wt 203.6 lb

## 2017-05-12 DIAGNOSIS — J302 Other seasonal allergic rhinitis: Secondary | ICD-10-CM

## 2017-05-12 DIAGNOSIS — J069 Acute upper respiratory infection, unspecified: Secondary | ICD-10-CM | POA: Diagnosis not present

## 2017-05-12 MED ORDER — FIRST-DUKES MOUTHWASH MT SUSP: 5.0000 mL | Freq: Three times a day (TID) | OROMUCOSAL | 0 refills | Status: DC | PRN

## 2017-05-12 MED ORDER — LEVOCETIRIZINE DIHYDROCHLORIDE 5 MG PO TABS: 5.0000 mg | ORAL_TABLET | Freq: Every evening | ORAL | 2 refills | Status: DC

## 2017-05-12 MED ORDER — MAGIC MOUTHWASH: 5.0000 mL | Freq: Three times a day (TID) | ORAL | 0 refills | Status: DC | PRN

## 2017-05-12 NOTE — Progress Notes (Addendum)
Name: Tamara HaberBarbara Jean Nelson   MRN: [id number]    DOB: 1965-07-15   Date:05/12/2017       Progress Note  Subjective  Chief Complaint  Chief Complaint  Patient presents with  . Sore Throat  . Fever    HPI  Patient presents with concern for fever (@0800  - 101.86F today at work - did not take any medication prior to appt) and sore throat (scratchy) x1 day; has a lot of nasal drainage but denies congestion. Endorses fatigue, chills,  No cough, chest pain, shortness of breath, ear pain/presure, abdominal pain, NVD.  Using azelastine and flonase and Xyzal PRN.   Patient Active Problem List   Diagnosis Date Noted  . B12 deficiency 10/23/2015  . Dyslipidemia 10/21/2015  . Bee sting allergy 10/17/2014  . Abnormal serum level of alkaline phosphatase 10/17/2014  . Menopause 10/17/2014  . Irritant contact dermatitis 10/17/2014  . Dysmetabolic syndrome 10/17/2014  . Obesity (BMI 30-39.9) 10/17/2014  . Vitamin D deficiency 10/17/2014  . Allergic rhinitis, seasonal 10/17/2014    Social History   Tobacco Use  . Smoking status: Never Smoker  . Smokeless tobacco: Never Used  Substance Use Topics  . Alcohol use: No    Alcohol/week: 0.0 oz     Current Outpatient Medications:  .  ammonium lactate (LAC-HYDRIN) 12 % cream, Apply topically as needed for dry skin., Disp: 385 g, Rfl: 0 .  azelastine (ASTELIN) 0.1 % nasal spray, PLACE 2 SPRAYS INTO BOTH NOSTRILS 2 (TWO) TIMES DAILY. USE IN EACH NOSTRIL AS DIRECTED, Disp: 30 mL, Rfl: 2 .  Cholecalciferol (VITAMIN D) 2000 UNITS CAPS, Take 1 capsule by mouth as needed., Disp: , Rfl:  .  Cyanocobalamin (B-12) 1000 MCG SUBL, Place 1 tablet under the tongue daily., Disp: 30 each, Rfl: 0 .  diphenhydrAMINE (BENADRYL ALLERGY) 25 MG tablet, Take 1 tablet by mouth as needed. Reported on 10/02/2015, Disp: , Rfl:  .  fluticasone (FLONASE) 50 MCG/ACT nasal spray, PLACE 2 SPRAYS INTO BOTH NOSTRILS AS NEEDED FOR RHINITIS., Disp: 16 g, Rfl: 5 .  levocetirizine (XYZAL)  5 MG tablet, Take 1 tablet (5 mg total) by mouth every evening., Disp: 90 tablet, Rfl: 2 .  Olopatadine HCl 0.2 % SOLN, INSTILL 1 DROP INTO BOTH EYES EVERY MORNING, Disp: , Rfl: 3 .  triamcinolone cream (KENALOG) 0.1 %, Apply topically 2 (two) times daily., Disp: 80 g, Rfl: 1 .  Diphenhyd-Hydrocort-Nystatin (FIRST-DUKES MOUTHWASH) SUSP, Use as directed 5 mLs in the mouth or throat 3 (three) times daily as needed., Disp: 237 mL, Rfl: 0 .  glucose blood (ONETOUCH VERIO) test strip, Reported on 10/02/2015, Disp: , Rfl:   Allergies  Allergen Reactions  . Bee Venom     ROS  Ten systems reviewed and is negative except as mentioned in HPI  Objective  Vitals:   05/12/17 0901 05/12/17 0934  BP: 136/72   Pulse: (!) 108 83  Temp: 99 F (37.2 C)   TempSrc: Oral   SpO2: 97%   Weight: 203 lb 9.6 oz (92.4 kg)   Height: 5\' 5"  (1.651 m)    Body mass index is 33.88 kg/m.  Nursing Note and Vital Signs reviewed.  Physical Exam  Constitutional: Patient appears well-developed and well-nourished. Obese No distress.  HEENT: head atraumatic, normocephalic, pupils equal and reactive to light, EOM's intact, TM's without erythema or bulging, no maxillary or frontal sinus pain on palpation, neck supple without lymphadenopathy, oropharynx very mildly erythematous and moist without exudate Cardiovascular: Normal rate, regular  rhythm, S1/S2 present.  No murmur or rub heard. No BLE edema. Pulmonary/Chest: Effort normal and breath sounds clear. No respiratory distress or retractions. Psychiatric: Patient has a normal mood and affect. behavior is normal. Judgment and thought content normal.  No results found for this or any previous visit (from the past 2160 hour(s)).   Assessment & Plan  1. Upper respiratory tract infection, unspecified type - Diphenhyd-Hydrocort-Nystatin (FIRST-DUKES MOUTHWASH) SUSP; Use as directed 5 mLs in the mouth or throat 3 (three) times daily as needed.  Dispense: 237 mL; Refill:  0 - Use Flonase, Azelastine and Xyzal daily.  Take Ibuprofen every 8 hours as needed for pain or fevers. - Drink plenty of fluids throughout the day, get plenty of rest. - Work note provided for today and tomorrow.   2. Seasonal allergic rhinitis, unspecified trigger - levocetirizine (XYZAL) 5 MG tablet; Take 1 tablet (5 mg total) by mouth every evening.  Dispense: 90 tablet; Refill: 2  -Red flags and when to present for emergency care or RTC including fever >101.41F, chest pain, shortness of breath, new/worsening/un-resolving symptoms, reviewed with patient at time of visit. Follow up and care instructions discussed and provided in AVS.

## 2017-05-12 NOTE — Patient Instructions (Addendum)
Use Flonase, Azelastine and Xyzal daily.  Take Ibuprofen every 8 hours as needed for pain or fevers. Drink plenty of fluids throughout the day, get plenty of rest.  Upper Respiratory Infection, Adult Most upper respiratory infections (URIs) are caused by a virus. A URI affects the nose, throat, and upper air passages. The most common type of URI is often called "the common cold." Follow these instructions at home:  Take medicines only as told by your doctor.  Gargle warm saltwater or take cough drops to comfort your throat as told by your doctor.  Use a warm mist humidifier or inhale steam from a shower to increase air moisture. This may make it easier to breathe.  Drink enough fluid to keep your pee (urine) clear or pale yellow.  Eat soups and other clear broths.  Have a healthy diet.  Rest as needed.  Go back to work when your fever is gone or your doctor says it is okay. ? You may need to stay home longer to avoid giving your URI to others. ? You can also wear a face mask and wash your hands often to prevent spread of the virus.  Use your inhaler more if you have asthma.  Do not use any tobacco products, including cigarettes, chewing tobacco, or electronic cigarettes. If you need help quitting, ask your doctor. Contact a doctor if:  You are getting worse, not better.  Your symptoms are not helped by medicine.  You have chills.  You are getting more short of breath.  You have brown or red mucus.  You have yellow or brown discharge from your nose.  You have pain in your face, especially when you bend forward.  You have a fever.  You have puffy (swollen) neck glands.  You have pain while swallowing.  You have white areas in the back of your throat. Get help right away if:  You have very bad or constant: ? Headache. ? Ear pain. ? Pain in your forehead, behind your eyes, and over your cheekbones (sinus pain). ? Chest pain.  You have long-lasting (chronic) lung  disease and any of the following: ? Wheezing. ? Long-lasting cough. ? Coughing up blood. ? A change in your usual mucus.  You have a stiff neck.  You have changes in your: ? Vision. ? Hearing. ? Thinking. ? Mood. This information is not intended to replace advice given to you by your health care provider. Make sure you discuss any questions you have with your health care provider. Document Released: 10/07/2007 Document Revised: 12/22/2015 Document Reviewed: 07/26/2013 Elsevier Interactive Patient Education  2018 [employer]

## 2017-05-12 NOTE — Addendum Note (Signed)
Addended by: Doren CustardBOYCE, EMILY E on: 05/12/2017 09:54 AM   Modules accepted: Orders

## 2017-05-12 NOTE — Progress Notes (Signed)
Erroneous

## 2017-06-08 ENCOUNTER — Ambulatory Visit: Payer: BLUE CROSS/BLUE SHIELD | Admitting: Family Medicine

## 2017-07-09 ENCOUNTER — Encounter: Payer: Self-pay | Admitting: Family Medicine

## 2017-07-09 ENCOUNTER — Ambulatory Visit: Payer: BLUE CROSS/BLUE SHIELD | Admitting: Family Medicine

## 2017-07-09 VITALS — BP 126/70 | HR 74 | Temp 97.5°F | Resp 16 | Ht 65.0 in | Wt 211.8 lb

## 2017-07-09 DIAGNOSIS — E669 Obesity, unspecified: Secondary | ICD-10-CM

## 2017-07-09 DIAGNOSIS — M722 Plantar fascial fibromatosis: Secondary | ICD-10-CM | POA: Diagnosis not present

## 2017-07-09 DIAGNOSIS — E538 Deficiency of other specified B group vitamins: Secondary | ICD-10-CM | POA: Diagnosis not present

## 2017-07-09 MED ORDER — MELOXICAM 15 MG PO TABS: 15.0000 mg | ORAL_TABLET | Freq: Every day | ORAL | 0 refills | Status: DC

## 2017-07-09 NOTE — Patient Instructions (Addendum)
Stretch your calf muscles, drink plenty of water, take your Vitamin B12 supplement.  Plantar Fasciitis Plantar fasciitis is a painful foot condition that affects the heel. It occurs when the band of tissue that connects the toes to the heel bone (plantar fascia) becomes irritated. This can happen after exercising too much or doing other repetitive activities (overuse injury). The pain from plantar fasciitis can range from mild irritation to severe pain that makes it difficult for you to walk or move. The pain is usually worse in the morning or after you have been sitting or lying down for a while. What are the causes? This condition may be caused by:  Standing for long periods of time.  Wearing shoes that do not fit.  Doing high-impact activities, including running, aerobics, and ballet.  Being overweight.  Having an abnormal way of walking (gait).  Having tight calf muscles.  Having high arches in your feet.  Starting a new athletic activity.  What are the signs or symptoms? The main symptom of this condition is heel pain. Other symptoms include:  Pain that gets worse after activity or exercise.  Pain that is worse in the morning or after resting.  Pain that goes away after you walk for a few minutes.  How is this diagnosed? This condition may be diagnosed based on your signs and symptoms. Your health care provider will also do a physical exam to check for:  A tender area on the bottom of your foot.  A high arch in your foot.  Pain when you move your foot.  Difficulty moving your foot.  You may also need to have imaging studies to confirm the diagnosis. These can include:  X-rays.  Ultrasound.  MRI.  How is this treated? Treatment for plantar fasciitis depends on the severity of the condition. Your treatment may include:  Rest, ice, and over-the-counter pain medicines to manage your pain.  Exercises to stretch your calves and your plantar fascia.  A splint  that holds your foot in a stretched, upward position while you sleep (night splint).  Physical therapy to relieve symptoms and prevent problems in the future.  Cortisone injections to relieve severe pain.  Extracorporeal shock wave therapy (ESWT) to stimulate damaged plantar fascia with electrical impulses. It is often used as a last resort before surgery.  Surgery, if other treatments have not worked after 12 months.  Follow these instructions at home:  Take medicines only as directed by your health care provider.  Avoid activities that cause pain.  Roll the bottom of your foot over a bag of ice or a bottle of cold water. Do this for 20 minutes, 3-4 times a day.  Perform simple stretches as directed by your health care provider.  Try wearing athletic shoes with air-sole or gel-sole cushions or soft shoe inserts.  Wear a night splint while sleeping, if directed by your health care provider.  Keep all follow-up appointments with your health care provider. How is this prevented?  Do not perform exercises or activities that cause heel pain.  Consider finding low-impact activities if you continue to have problems.  Lose weight if you need to. The best way to prevent plantar fasciitis is to avoid the activities that aggravate your plantar fascia. Contact a health care provider if:  Your symptoms do not go away after treatment with home care measures.  Your pain gets worse.  Your pain affects your ability to move or do your daily activities. This information is not intended  to replace advice given to you by your health care provider. Make sure you discuss any questions you have with your health care provider. Document Released: 01/13/2001 Document Revised: 09/23/2015 Document Reviewed: 02/28/2014 Elsevier Interactive Patient Education  2018 [employer].   Heart-Healthy Eating Plan Heart-healthy meal planning includes:  Limiting unhealthy fats.  Increasing healthy  fats.  Making other small dietary changes.  You may need to talk with your doctor or a diet specialist (dietitian) to create an eating plan that is right for you. What types of fat should I choose?  Choose healthy fats. These include olive oil and canola oil, flaxseeds, walnuts, almonds, and seeds.  Eat more omega-3 fats. These include salmon, mackerel, sardines, tuna, flaxseed oil, and ground flaxseeds. Try to eat fish at least twice each week.  Limit saturated fats. ? Saturated fats are often found in animal products, such as meats, butter, and cream. ? Plant sources of saturated fats include palm oil, palm kernel oil, and coconut oil.  Avoid foods with partially hydrogenated oils in them. These include stick margarine, some tub margarines, cookies, crackers, and other baked goods. These contain trans fats. What general guidelines do I need to follow?  Check food labels carefully. Identify foods with trans fats or high amounts of saturated fat.  Fill one half of your plate with vegetables and green salads. Eat 4-5 servings of vegetables per day. A serving of vegetables is: ? 1 cup of raw leafy vegetables. ?  cup of raw or cooked cut-up vegetables. ?  cup of vegetable juice.  Fill one fourth of your plate with whole grains. Look for the word "whole" as the first word in the ingredient list.  Fill one fourth of your plate with lean protein foods.  Eat 4-5 servings of fruit per day. A serving of fruit is: ? One medium whole fruit. ?  cup of dried fruit. ?  cup of fresh, frozen, or canned fruit. ?  cup of 100% fruit juice.  Eat more foods that contain soluble fiber. These include apples, broccoli, carrots, beans, peas, and barley. Try to get 20-30 g of fiber per day.  Eat more home-cooked food. Eat less restaurant, buffet, and fast food.  Limit or avoid alcohol.  Limit foods high in starch and sugar.  Avoid fried foods.  Avoid frying your food. Try baking, boiling,  grilling, or broiling it instead. You can also reduce fat by: ? Removing the skin from poultry. ? Removing all visible fats from meats. ? Skimming the fat off of stews, soups, and gravies before serving them. ? Steaming vegetables in water or broth.  Lose weight if you are overweight.  Eat 4-5 servings of nuts, legumes, and seeds per week: ? One serving of dried beans or legumes equals  cup after being cooked. ? One serving of nuts equals 1 ounces. ? One serving of seeds equals  ounce or one tablespoon.  You may need to keep track of how much salt or sodium you eat. This is especially true if you have high blood pressure. Talk with your doctor or dietitian to get more information. What foods can I eat? Grains Breads, including [place], white, pita, wheat, raisin, rye, oatmeal, and [place]. Tortillas that are neither fried nor made with lard or trans fat. Low-fat rolls, including hotdog and hamburger buns and English muffins. Biscuits. Muffins. Waffles. Pancakes. Light popcorn. Whole-grain cereals. Flatbread. Melba toast. Pretzels. Breadsticks. Rusks. Low-fat snacks. Low-fat crackers, including oyster, saltine, matzo, graham, animal, and rye.  Rice and pasta, including brown rice and pastas that are made with whole wheat. Vegetables All vegetables. Fruits All fruits, but limit coconut. Meats and Other Protein Sources Lean, well-trimmed beef, veal, pork, and lamb. Chicken and [place] without skin. All fish and shellfish. Wild duck, rabbit, pheasant, and venison. Egg whites or low-cholesterol egg substitutes. Dried beans, peas, lentils, and tofu. Seeds and most nuts. Dairy Low-fat or nonfat cheeses, including ricotta, string, and mozzarella. Skim or 1% milk that is liquid, powdered, or evaporated. Buttermilk that is made with low-fat milk. Nonfat or low-fat yogurt. Beverages Mineral water. Diet carbonated beverages. Sweets and Desserts Sherbets and fruit ices. Honey, jam, marmalade, jelly,  and syrups. Meringues and gelatins. Pure sugar candy, such as hard candy, jelly beans, gumdrops, mints, marshmallows, and small amounts of dark chocolate. [employer]. Eat all sweets and desserts in moderation. Fats and Oils Nonhydrogenated (trans-free) margarines. Vegetable oils, including soybean, sesame, sunflower, olive, peanut, safflower, corn, canola, and cottonseed. Salad dressings or mayonnaise made with a vegetable oil. Limit added fats and oils that you use for cooking, baking, salads, and as spreads. Other Cocoa powder. Coffee and tea. All seasonings and condiments. The items listed above may not be a complete list of recommended foods or beverages. Contact your dietitian for more options. What foods are not recommended? Grains Breads that are made with saturated or trans fats, oils, or whole milk. Croissants. Butter rolls. Cheese breads. Sweet rolls. Donuts. Buttered popcorn. Chow mein noodles. High-fat crackers, such as cheese or butter crackers. Meats and Other Protein Sources Fatty meats, such as hotdogs, short ribs, sausage, spareribs, bacon, rib eye roast or steak, and mutton. High-fat deli meats, such as salami and bologna. Caviar. Domestic duck and goose. Organ meats, such as kidney, liver, sweetbreads, and heart. Dairy Cream, sour cream, cream cheese, and creamed cottage cheese. Whole-milk cheeses, including blue (bleu), [address], [place], [place], [address], [place], [address], cheddar, [place], and [place]. Whole or 2% milk that is liquid, evaporated, or condensed. Whole buttermilk. Cream sauce or high-fat cheese sauce. Yogurt that is made from whole milk. Beverages Regular sodas and juice drinks with added sugar. Sweets and Desserts Frosting. Pudding. Cookies. Cakes other than angel food cake. Candy that has milk chocolate or white chocolate, hydrogenated fat, butter, coconut, or unknown ingredients. Buttered syrups. Full-fat ice cream or ice cream drinks. Fats and  Oils Gravy that has suet, meat fat, or shortening. Cocoa butter, hydrogenated oils, palm oil, coconut oil, palm kernel oil. These can often be found in baked products, candy, fried foods, nondairy creamers, and whipped toppings. Solid fats and shortenings, including bacon fat, salt pork, lard, and butter. Nondairy cream substitutes, such as coffee creamers and sour cream substitutes. Salad dressings that are made of unknown oils, cheese, or sour cream. The items listed above may not be a complete list of foods and beverages to avoid. Contact your dietitian for more information. This information is not intended to replace advice given to you by your health care provider. Make sure you discuss any questions you have with your health care provider. Document Released: 10/20/2011 Document Revised: 09/26/2015 Document Reviewed: 10/12/2013 Elsevier Interactive Patient Education  [employer].

## 2017-07-09 NOTE — Progress Notes (Signed)
Name: Tamara HaberBarbara Jean Nelson   MRN: [id number]    DOB: 07-28-1965   Date:07/09/2017       Progress Note  Subjective  Chief Complaint  Chief Complaint  Patient presents with  . Foot Swelling    Onset-1 week, right foot-swelling, painful, red after being on her feet all day.     HPI  Right Heel Pain Pt endorses right heel pain has been ongoing for a 2 weeks. Patient states pain is worse when first standing up and feels better when she is not on feet. Pain seems to get a little better with walking. Pt denies pain in left heel. Patient states tried soaking in Epson salts on the weekend and it resolves pain for a little while. Patient denies known trauma or injury to foot. Pt endorses mild numbness of heel first thing in the morning that resolves when walking on it. Patient denies rashes, personal or family history of RA. Patient wears tennis shoes all day because she walk a lot for work. Pt had similar episode 2 years ago that was relieved by meloxicam.   Obesity  Patient states eating a lot of breads and fried foods. Patient states winter hardest time due to being cold outside. Drinks about 2 glasses of water a day along with mountain dew and sweet tea.  Discussed lifestyle modifications in detail.  B12 deficiency  Patient denies paresthesias other than right heel. Patient states has been taking B12 OTC at least twice a week. Denies balance issues, shob, fatigue.   Patient Active Problem List   Diagnosis Date Noted  . B12 deficiency 10/23/2015  . Dyslipidemia 10/21/2015  . Bee sting allergy 10/17/2014  . Abnormal serum level of alkaline phosphatase 10/17/2014  . Menopause 10/17/2014  . Irritant contact dermatitis 10/17/2014  . Dysmetabolic syndrome 10/17/2014  . Obesity (BMI 30-39.9) 10/17/2014  . Vitamin D deficiency 10/17/2014  . Allergic rhinitis, seasonal 10/17/2014    Social History   Tobacco Use  . Smoking status: Never Smoker  . Smokeless tobacco: Never Used  Substance Use  Topics  . Alcohol use: No    Alcohol/week: 0.0 oz     Current Outpatient Medications:  .  ammonium lactate (LAC-HYDRIN) 12 % cream, Apply topically as needed for dry skin., Disp: 385 g, Rfl: 0 .  azelastine (ASTELIN) 0.1 % nasal spray, PLACE 2 SPRAYS INTO BOTH NOSTRILS 2 (TWO) TIMES DAILY. USE IN EACH NOSTRIL AS DIRECTED, Disp: 30 mL, Rfl: 2 .  Cholecalciferol (VITAMIN D) 2000 UNITS CAPS, Take 1 capsule by mouth as needed., Disp: , Rfl:  .  Cyanocobalamin (B-12) 1000 MCG SUBL, Place 1 tablet under the tongue daily., Disp: 30 each, Rfl: 0 .  diphenhydrAMINE (BENADRYL ALLERGY) 25 MG tablet, Take 1 tablet by mouth as needed. Reported on 10/02/2015, Disp: , Rfl:  .  fluticasone (FLONASE) 50 MCG/ACT nasal spray, PLACE 2 SPRAYS INTO BOTH NOSTRILS AS NEEDED FOR RHINITIS., Disp: 16 g, Rfl: 5 .  glucose blood (ONETOUCH VERIO) test strip, Reported on 10/02/2015, Disp: , Rfl:  .  levocetirizine (XYZAL) 5 MG tablet, Take 1 tablet (5 mg total) by mouth every evening., Disp: 90 tablet, Rfl: 2 .  magic mouthwash SOLN, Take 5 mLs by mouth 3 (three) times daily as needed for mouth pain., Disp: 200 mL, Rfl: 0 .  Olopatadine HCl 0.2 % SOLN, INSTILL 1 DROP INTO BOTH EYES EVERY MORNING, Disp: , Rfl: 3 .  triamcinolone cream (KENALOG) 0.1 %, Apply topically 2 (two) times daily., Disp: 80  g, Rfl: 1 .  meloxicam (MOBIC) 15 MG tablet, Take 1 tablet (15 mg total) by mouth daily. Make sure to take this with food., Disp: 20 tablet, Rfl: 0  Allergies  Allergen Reactions  . Bee Venom     ROS   Constitutional: Negative for fever or weight change.  Respiratory: Negative for cough and shortness of breath.   Cardiovascular: Negative for chest pain or palpitations.  Gastrointestinal: Negative for abdominal pain, no bowel changes.  Musculoskeletal: Positive right heel pain Negative for gait problem or joint swelling.  Skin: Negative for rash.  Neurological: Negative for dizziness or headache.  No other specific  complaints in a complete review of systems (except as listed in HPI above).  Objective  Vitals:   07/09/17 1439  BP: 126/70  Pulse: 74  Resp: 16  Temp: (!) 97.5 F (36.4 C)  TempSrc: Oral  SpO2: 95%  Weight: 211 lb 12.8 oz (96.1 kg)  Height: 5\' 5"  (1.651 m)     Body mass index is 35.25 kg/m.  Nursing Note and Vital Signs reviewed.  Physical Exam  Constitutional: She is well-developed, well-nourished, and in no distress.  HENT:  Head: Normocephalic and atraumatic.  Neck: Normal range of motion.  Cardiovascular: Normal rate, regular rhythm and normal heart sounds.  Pulmonary/Chest: Effort normal.  Abdominal: Soft.  Musculoskeletal: She exhibits no edema, tenderness or deformity.       Right foot: There is normal range of motion, no tenderness, no bony tenderness, no swelling, normal capillary refill and no deformity.       Feet:  Neurological: She is alert.  Skin: Skin is warm and dry. No rash noted. No erythema. No pallor.  Psychiatric: Mood, memory, affect and judgment normal.    No results found for this or any previous visit (from the past 72 hour(s)).  Assessment & Plan  1. Plantar fasciitis Discussed podiatry referral pt politely declines at this time.  - meloxicam (MOBIC) 15 MG tablet; Take 1 tablet (15 mg total) by mouth daily. Make sure to take this with food.  Dispense: 30 tablet; Refill: 0 - Discussed plantar fasciitis care in detail - see AVS; recommend rolling frozen water bottle, good stretching.  2. Obesity (BMI 30-39.9) - Discussed importance of 150 minutes of physical activity weekly, eat two servings of fish weekly, eat one serving of tree nuts ( cashews, pistachios, pecans, almonds.Marland Kitchen) every other day, eat 6 servings of fruit/vegetables daily and drink plenty of water and avoid sweet beverages.   3. B12 deficiency - Declines recheck today; we will consider if numbness is not improving.  - Work note to return on 07/12/2017 is provided to give time  to recover. -Red flags and when to present for emergency care or RTC including fever >101.28F, chest pain, shortness of breath, new/worsening/un-resolving symptoms, calf pain/swelling/tenderness, worsening pain of the heel, reviewed with patient at time of visit. Follow up and care instructions discussed and provided in AVS.

## 2017-08-25 ENCOUNTER — Ambulatory Visit: Payer: BLUE CROSS/BLUE SHIELD | Admitting: Family Medicine

## 2017-08-25 ENCOUNTER — Encounter: Payer: Self-pay | Admitting: Family Medicine

## 2017-08-25 VITALS — BP 134/78 | HR 76 | Temp 98.3°F | Resp 16 | Ht 65.0 in | Wt 212.2 lb

## 2017-08-25 DIAGNOSIS — Z91030 Bee allergy status: Secondary | ICD-10-CM | POA: Diagnosis not present

## 2017-08-25 DIAGNOSIS — E538 Deficiency of other specified B group vitamins: Secondary | ICD-10-CM | POA: Diagnosis not present

## 2017-08-25 MED ORDER — CYANOCOBALAMIN 1000 MCG/ML IJ SOLN: 1000.0000 ug | Freq: Once | INTRAMUSCULAR | 0 refills | Status: AC

## 2017-08-25 MED ORDER — EPINEPHRINE 0.3 MG/0.3ML IJ SOAJ: 0.3000 mg | Freq: Once | INTRAMUSCULAR | 1 refills | Status: AC

## 2017-08-25 MED ORDER — CYANOCOBALAMIN 1000 MCG/ML IJ SOLN
1000.0000 ug | Freq: Once | INTRAMUSCULAR | Status: AC
  Administered 2017-08-25: 1000 ug via INTRAMUSCULAR

## 2017-08-25 NOTE — Patient Instructions (Signed)
OOFS flip flops

## 2017-08-25 NOTE — Progress Notes (Signed)
Name: Tamara Nelson   MRN: [id number]    DOB: 06-03-1965   Date:08/25/2017       Progress Note  Subjective  Chief Complaint  Chief Complaint  Patient presents with  . Labs Only    Check B12 and needs a refill on Epi-Pen  . Fatigue  . Allergic Reaction    Has a allergy to Bees and is going to help her friend with obtaining the Honey and will need the Epi-Pen incase she gets stung.    HPI  Fatigue: she thinks B12 is low again, she is taking otc supplementation occasionally,  but is getting very tired, we will recheck levels and give her a B12 injection today She denies paresthesias.   Bee allergies: she helps a friend take care of her bee hives and needs a refill of Epipen. She never had an anaphylactic reaction but had swelling and redness on site of sting   Patient Active Problem List   Diagnosis Date Noted  . B12 deficiency 10/23/2015  . Dyslipidemia 10/21/2015  . Bee sting allergy 10/17/2014  . Abnormal serum level of alkaline phosphatase 10/17/2014  . Menopause 10/17/2014  . Irritant contact dermatitis 10/17/2014  . Dysmetabolic syndrome 10/17/2014  . Obesity (BMI 30-39.9) 10/17/2014  . Vitamin D deficiency 10/17/2014  . Allergic rhinitis, seasonal 10/17/2014    Social History   Tobacco Use  . Smoking status: Never Smoker  . Smokeless tobacco: Never Used  Substance Use Topics  . Alcohol use: No    Alcohol/week: 0.0 oz     Current Outpatient Medications:  .  ammonium lactate (LAC-HYDRIN) 12 % cream, Apply topically as needed for dry skin., Disp: 385 g, Rfl: 0 .  azelastine (ASTELIN) 0.1 % nasal spray, PLACE 2 SPRAYS INTO BOTH NOSTRILS 2 (TWO) TIMES DAILY. USE IN EACH NOSTRIL AS DIRECTED, Disp: 30 mL, Rfl: 2 .  Cholecalciferol (VITAMIN D) 2000 UNITS CAPS, Take 1 capsule by mouth as needed., Disp: , Rfl:  .  Cyanocobalamin (B-12) 1000 MCG SUBL, Place 1 tablet under the tongue daily., Disp: 30 each, Rfl: 0 .  fluticasone (FLONASE) 50 MCG/ACT nasal spray, PLACE 2  SPRAYS INTO BOTH NOSTRILS AS NEEDED FOR RHINITIS., Disp: 16 g, Rfl: 5 .  levocetirizine (XYZAL) 5 MG tablet, Take 1 tablet (5 mg total) by mouth every evening., Disp: 90 tablet, Rfl: 2 .  meloxicam (MOBIC) 15 MG tablet, Take 1 tablet (15 mg total) by mouth daily. Make sure to take this with food., Disp: 20 tablet, Rfl: 0 .  Olopatadine HCl 0.2 % SOLN, INSTILL 1 DROP INTO BOTH EYES EVERY MORNING, Disp: , Rfl: 3 .  triamcinolone cream (KENALOG) 0.1 %, Apply topically 2 (two) times daily., Disp: 80 g, Rfl: 1 .  EPINEPHrine (EPIPEN 2-PAK) 0.3 mg/0.3 mL IJ SOAJ injection, Inject 0.3 mLs (0.3 mg total) into the muscle once for 1 dose., Disp: 1 Device, Rfl: 1 .  glucose blood (ONETOUCH VERIO) test strip, Reported on 10/02/2015, Disp: , Rfl:   Allergies  Allergen Reactions  . Bee Venom     ROS  Ten systems reviewed and is negative except as mentioned in HPI   Objective  Vitals:   08/25/17 1350  BP: 134/78  Pulse: 76  Resp: 16  Temp: 98.3 F (36.8 C)  TempSrc: Oral  SpO2: 94%  Weight: 212 lb 3.2 oz (96.3 kg)  Height: 5\' 5"  (1.651 m)    Body mass index is 35.31 kg/m.    Physical Exam  Constitutional: Patient  appears well-developed and well-nourished. Obese No distress.  HEENT: head atraumatic, normocephalic, pupils equal and reactive to light, neck supple, throat within normal limits Cardiovascular: Normal rate, regular rhythm and normal heart sounds.  No murmur heard. No BLE edema. Pulmonary/Chest: Effort normal and breath sounds normal. No respiratory distress. Abdominal: Soft.  There is no tenderness. Psychiatric: Patient has a normal mood and affect. behavior is normal. Judgment and thought content normal.  Assessment & Plan  1. B12 deficiency  - Vitamin B12 - B12 injections after labs are drawn today   2. Allergic to bees  - EPINEPHrine (EPIPEN 2-PAK) 0.3 mg/0.3 mL IJ SOAJ injection; Inject 0.3 mLs (0.3 mg total) into the muscle once for 1 dose.  Dispense: 1 Device;  Refill: 1

## 2017-08-25 NOTE — Addendum Note (Signed)
Addended by: Cynda FamiliaJOHNSON, Chloie Loney L on: 08/25/2017 02:48 PM   Modules accepted: Orders

## 2017-08-25 NOTE — Addendum Note (Signed)
Addended by: Cynda FamiliaJOHNSON, Kassie Keng L on: 08/25/2017 02:44 PM   Modules accepted: Orders

## 2017-08-26 ENCOUNTER — Other Ambulatory Visit: Payer: Self-pay | Admitting: Family Medicine

## 2017-08-26 DIAGNOSIS — M79671 Pain in right foot: Secondary | ICD-10-CM

## 2017-08-26 LAB — VITAMIN B12: VITAMIN B 12: 579 pg/mL (ref 200–1100)

## 2017-09-02 ENCOUNTER — Telehealth: Payer: Self-pay | Admitting: Family Medicine

## 2017-09-02 NOTE — Telephone Encounter (Signed)
Copied from CRM [id number]. Topic: Quick Communication - See Telephone Encounter >> Sep 02, 2017  8:13 AM Oneal Grout wrote: CRM for notification. See Telephone encounter for: 09/02/17. [facility] did not receive script for EPINEPHrine (EPIPEN 2-PAK) 0.3 mg/0.3 mL IJ SOAJ injection

## 2017-09-02 NOTE — Telephone Encounter (Signed)
[facility] receive the [facility]Pen prescription on 08/25/17 but due to mass demand it has put this drug on backorder. The pharmacy has no knowledge on when it will be available. Called the patient and left a voicemail with all this information.

## 2017-09-03 ENCOUNTER — Other Ambulatory Visit: Payer: Self-pay

## 2017-09-03 DIAGNOSIS — M722 Plantar fascial fibromatosis: Secondary | ICD-10-CM

## 2017-09-03 MED ORDER — MELOXICAM 15 MG PO TABS: 15.0000 mg | ORAL_TABLET | Freq: Every day | ORAL | 0 refills | Status: DC

## 2017-09-03 NOTE — Telephone Encounter (Signed)
Refill request for general medication. [facility]oxicam to [facility]   Last office visit: 08/25/2017   Follow up 12/14/17

## 2017-10-16 ENCOUNTER — Other Ambulatory Visit: Payer: Self-pay | Admitting: Family Medicine

## 2017-10-21 ENCOUNTER — Ambulatory Visit: Payer: BLUE CROSS/BLUE SHIELD | Admitting: Nurse Practitioner

## 2017-10-21 ENCOUNTER — Encounter: Payer: Self-pay | Admitting: Nurse Practitioner

## 2017-10-21 VITALS — BP 128/72 | HR 78 | Temp 98.3°F | Resp 16 | Ht 65.0 in | Wt 212.8 lb

## 2017-10-21 DIAGNOSIS — R21 Rash and other nonspecific skin eruption: Secondary | ICD-10-CM

## 2017-10-21 DIAGNOSIS — J309 Allergic rhinitis, unspecified: Secondary | ICD-10-CM

## 2017-10-21 DIAGNOSIS — R062 Wheezing: Secondary | ICD-10-CM | POA: Diagnosis not present

## 2017-10-21 DIAGNOSIS — Z9189 Other specified personal risk factors, not elsewhere classified: Secondary | ICD-10-CM | POA: Diagnosis not present

## 2017-10-21 DIAGNOSIS — Z889 Allergy status to unspecified drugs, medicaments and biological substances status: Secondary | ICD-10-CM

## 2017-10-21 MED ORDER — ALBUTEROL SULFATE (2.5 MG/3ML) 0.083% IN NEBU: 2.5000 mg | INHALATION_SOLUTION | Freq: Once | RESPIRATORY_TRACT | Status: DC

## 2017-10-21 MED ORDER — MONTELUKAST SODIUM 10 MG PO TABS: 10.0000 mg | ORAL_TABLET | Freq: Every day | ORAL | 0 refills | Status: DC

## 2017-10-21 NOTE — Progress Notes (Addendum)
Name: Tamara HaberBarbara Jean Nelson   MRN: [id number]    DOB: December 12, 1965   Date:10/21/2017       Progress Note  Subjective  Chief Complaint  Chief Complaint  Patient presents with  . URI    cough, congested, runny nose for 5 days    HPI  5 days of cough, nasal congestion headache- frontal head fullness behind eyes. Runny nose and watery eyes. States has been taking xyzal and flonase with mild relief but symptoms return.  sts Monday felt so bad had to call out of work.   Denies shortness of breath, chest pain, fever, sore throat, denies dental, maxillary pain.  States also has these small red bumps popping up on arms and legs states noticed she got them when she ate red foods- like strawberries and cranberries and they resolved when she stopped. Has not ate them but they are popping up again, mildly itchy, no new detergents, soaps, beds.   Patient Active Problem List   Diagnosis Date Noted  . B12 deficiency 10/23/2015  . Dyslipidemia 10/21/2015  . Bee sting allergy 10/17/2014  . Abnormal serum level of alkaline phosphatase 10/17/2014  . Menopause 10/17/2014  . Irritant contact dermatitis 10/17/2014  . Dysmetabolic syndrome 10/17/2014  . Obesity (BMI 30-39.9) 10/17/2014  . Vitamin D deficiency 10/17/2014  . Allergic rhinitis, seasonal 10/17/2014    Past Medical History:  Diagnosis Date  . Alkaline phosphatase raised   . Allergy   . Irritant contact dermatitis   . Metabolic syndrome   . Vitamin D deficiency     No past surgical history on file.  Social History   Tobacco Use  . Smoking status: Never Smoker  . Smokeless tobacco: Never Used  Substance Use Topics  . Alcohol use: No    Alcohol/week: 0.0 oz     Current Outpatient Medications:  .  ammonium lactate (LAC-HYDRIN) 12 % cream, Apply topically as needed for dry skin., Disp: 385 g, Rfl: 0 .  azelastine (ASTELIN) 0.1 % nasal spray, PLACE 2 SPRAYS INTO BOTH NOSTRILS 2 (TWO) TIMES DAILY. USE IN EACH NOSTRIL AS DIRECTED,  Disp: 30 mL, Rfl: 1 .  Cholecalciferol (VITAMIN D) 2000 UNITS CAPS, Take 1 capsule by mouth as needed., Disp: , Rfl:  .  Cyanocobalamin (B-12) 1000 MCG SUBL, Place 1 tablet under the tongue daily., Disp: 30 each, Rfl: 0 .  fluticasone (FLONASE) 50 MCG/ACT nasal spray, PLACE 2 SPRAYS INTO BOTH NOSTRILS AS NEEDED FOR RHINITIS., Disp: 16 g, Rfl: 5 .  glucose blood (ONETOUCH VERIO) test strip, Reported on 10/02/2015, Disp: , Rfl:  .  levocetirizine (XYZAL) 5 MG tablet, Take 1 tablet (5 mg total) by mouth every evening., Disp: 90 tablet, Rfl: 2 .  meloxicam (MOBIC) 15 MG tablet, Take 1 tablet (15 mg total) by mouth daily. Make sure to take this with food., Disp: 30 tablet, Rfl: 0 .  Olopatadine HCl 0.2 % SOLN, INSTILL 1 DROP INTO BOTH EYES EVERY MORNING, Disp: , Rfl: 3 .  triamcinolone cream (KENALOG) 0.1 %, Apply topically 2 (two) times daily., Disp: 80 g, Rfl: 1  Allergies  Allergen Reactions  . Bee Venom     ROS   No other specific complaints in a complete review of systems (except as listed in HPI above).  Objective  Vitals:   10/21/17 1147  BP: 128/72  Pulse: 78  Resp: 16  Temp: 98.3 F (36.8 C)  TempSrc: Oral  SpO2: 96%  Weight: 212 lb 12.8 oz (96.5 kg)  Height: 5\' 5"  (1.651 m)    Body mass index is 35.41 kg/m.  Nursing Note and Vital Signs reviewed.  Physical Exam   Constitutional: Patient appears well-developed and well-nourished. Obese No distress.  HEENT: head atraumatic, normocephalic, pupils equal and reactive to light, TM's without erythema or bulging, no maxillary or frontal sinus tenderness , neck supple without lymphadenopathy, oropharynx pink and moist without exudate, no nasal discharge Cardiovascular: Normal rate, regular rhythm, S1/S2 present.  No murmur or rub heard.  Pulmonary/Chest: Effort normal and breath sounds clear - left upper lobe expiratory wheezing resolved after breathing treathment. No respiratory distress or retractions. Skin: small red  raised bumps on bilateral arms and 3 bumps noted on left leg. Pt sts itchy to touch.  Psychiatric: Patient has a normal mood and affect. behavior is normal. Judgment and thought content normal.  No results found for this or any previous visit (from the past 72 hour(s)).  Assessment & Plan  1. H/O multiple allergies - montelukast (SINGULAIR) 10 MG tablet; Take 1 tablet (10 mg total) by mouth at bedtime.  Dispense: 90 tablet; Refill: 0 - Ambulatory referral to Allergy  2. Allergic rhinitis, unspecified seasonality, unspecified trigger - Flonase, Claritin  3. Rash due to allergy - use triamcinolone cream and keep journal, send to allergist 4. Wheezing  - albuterol (PROVENTIL) (2.5 MG/3ML) 0.083% nebulizer solution 2.5 mg    -Red flags and when to present for emergency care or RTC including fever >101.71F, chest pain, shortness of breath, new/worsening/un-resolving symptoms, reviewed with patient at time of visit. Follow up and care instructions discussed and provided in AVS.  --------------------------------------------------- I have reviewed this encounter including the documentation in this note and/or discussed this patient with the provider, Sharyon Cable DNP AGNP-C. I am certifying that I agree with the content of this note as supervising physician. Baruch Gouty, MD [facility] 10/27/2017, 4:47 PM

## 2017-10-21 NOTE — Patient Instructions (Signed)
-   Continue to taking xyzal daily can switch to benadryl at night if needed - Continue taking flonase - Take new medication- singulair every day - call us on Tuesday if not improving - Use cream on red areas - Try to think of triggers and write down things you have been in contact with 12 hours before when your symptoms worsen.

## 2017-10-29 ENCOUNTER — Encounter: Payer: BLUE CROSS/BLUE SHIELD | Admitting: Family Medicine

## 2017-12-14 ENCOUNTER — Other Ambulatory Visit (HOSPITAL_COMMUNITY)
Admission: RE | Admit: 2017-12-14 | Discharge: 2017-12-14 | Disposition: A | Discharge status: Home / Self Care | Payer: BLUE CROSS/BLUE SHIELD | Source: Ambulatory Visit | Attending: Family Medicine | Admitting: Family Medicine

## 2017-12-14 ENCOUNTER — Encounter: Payer: Self-pay | Admitting: Family Medicine

## 2017-12-14 ENCOUNTER — Ambulatory Visit (INDEPENDENT_AMBULATORY_CARE_PROVIDER_SITE_OTHER): Payer: BLUE CROSS/BLUE SHIELD | Admitting: Family Medicine

## 2017-12-14 VITALS — BP 118/72 | HR 67 | Temp 98.1°F | Resp 14 | Ht 65.0 in | Wt 208.7 lb

## 2017-12-14 DIAGNOSIS — Z124 Encounter for screening for malignant neoplasm of cervix: Secondary | ICD-10-CM

## 2017-12-14 DIAGNOSIS — E559 Vitamin D deficiency, unspecified: Secondary | ICD-10-CM

## 2017-12-14 DIAGNOSIS — Z01419 Encounter for gynecological examination (general) (routine) without abnormal findings: Secondary | ICD-10-CM | POA: Diagnosis not present

## 2017-12-14 DIAGNOSIS — E538 Deficiency of other specified B group vitamins: Secondary | ICD-10-CM

## 2017-12-14 DIAGNOSIS — E785 Hyperlipidemia, unspecified: Secondary | ICD-10-CM

## 2017-12-14 DIAGNOSIS — Z23 Encounter for immunization: Secondary | ICD-10-CM

## 2017-12-14 DIAGNOSIS — E8881 Metabolic syndrome: Secondary | ICD-10-CM | POA: Diagnosis not present

## 2017-12-14 NOTE — Progress Notes (Signed)
Name: Tamara Nelson   MRN: [id number]    DOB: October 15, 1965   Date:12/14/2017       Progress Note  Subjective  Chief Complaint  Chief Complaint  Patient presents with  . Annual Exam    HPI   Patient presents for annual CPE , she will return for follow up.  Diet: she has been trying to eat fruit daily, discussed tree nuts and fish,    USPSTF grade A and B recommendations    [facility] Visit from 12/14/2017 in [facility]  AUDIT-C Score  0     Depression:  Depression screen [facility] 2/9 12/14/2017 07/09/2017 09/14/2016 02/18/2016 01/01/2016  Decreased Interest 0 0 0 0 0  Down, Depressed, Hopeless 0 0 0 0 0  PHQ - 2 Score 0 0 0 0 0  Altered sleeping 0 - - - -  Tired, decreased energy 0 - - - -  Change in appetite 0 - - - -  Feeling bad or failure about yourself  0 - - - -  Trouble concentrating 0 - - - -  Moving slowly or fidgety/restless 0 - - - -  Suicidal thoughts 0 - - - -  PHQ-9 Score 0 - - - -  Difficult doing work/chores Not difficult at all - - - -   Hypertension: BP Readings from Last 3 Encounters:  10/21/17 128/72  08/25/17 134/78  07/09/17 126/70   Obesity: Wt Readings from Last 3 Encounters:  12/14/17 208 lb 11.2 oz (94.7 kg)  10/21/17 212 lb 12.8 oz (96.5 kg)  08/25/17 212 lb 3.2 oz (96.3 kg)   BMI Readings from Last 3 Encounters:  12/14/17 34.73 kg/m  10/21/17 35.41 kg/m  08/25/17 35.31 kg/m     STD testing and prevention (HIV/chl/gon/syphilis): N/A Intimate partner violence:negative screen : negative screen  Sexual History/Pain during Intercourse: never been sexually active  Menstrual History/LMP/Abnormal Bleeding: post-menopausal, discussed need to return for follow up if any vaginal bleeding Incontinence Symptoms: none   Advanced Care Planning: A voluntary discussion about advance care planning including the explanation and discussion of advance directives.  Discussed health care proxy and Living will, and the patient was able to  identify a health care proxy as sister .  Patient does not have a living will at present time.  Breast cancer:   HM Mammogram  Date Value Ref Range Status  11/29/2013 Normal  Final    BRCA gene screening: discussed USPTF , she would like to continue checking it yearly  Cervical cancer screening: discussed USPTF and she would like to have it done today    Lipids:  Lab Results  Component Value Date   CHOL 160 10/26/2016   CHOL 167 10/21/2015   CHOL 181 10/19/2014   Lab Results  Component Value Date   HDL 38 (L) 10/26/2016   HDL 41 (L) 10/21/2015   HDL 37 (L) 10/19/2014   Lab Results  Component Value Date   LDLCALC 107 (H) 10/26/2016   LDLCALC 107 10/21/2015   LDLCALC 123 (H) 10/19/2014   Lab Results  Component Value Date   TRIG 75 10/26/2016   TRIG 95 10/21/2015   TRIG 105 10/19/2014   Lab Results  Component Value Date   CHOLHDL 4.2 10/26/2016   CHOLHDL 4.1 10/21/2015   CHOLHDL 4.9 (H) 10/19/2014   No results found for: LDLDIRECT  Glucose:  Glucose  Date Value Ref Range Status  10/19/2014 106 (H) 65 - 99 mg/dL Final   Glucose, Bld  Date Value Ref Range Status  09/14/2016 92 65 - 99 mg/dL Final  10/21/2015 94 65 - 99 mg/dL Final    Skin cancer: discussed atypical lesions Colorectal cancer: repeat 06/05/2020 ECG: 2015  Patient Active Problem List   Diagnosis Date Noted  . B12 deficiency 10/23/2015  . Dyslipidemia 10/21/2015  . Bee sting allergy 10/17/2014  . Abnormal serum level of alkaline phosphatase 10/17/2014  . Menopause 10/17/2014  . Irritant contact dermatitis 10/17/2014  . Dysmetabolic syndrome 92/03/9416  . Obesity (BMI 30-39.9) 10/17/2014  . Vitamin D deficiency 10/17/2014  . Allergic rhinitis, seasonal 10/17/2014    History reviewed. No pertinent surgical history.  Family History  Problem Relation Age of Onset  . Cancer Mother        colon  . Cancer Father        Liver and Lung  . Stroke Father   . Hypertension Brother   .  Diabetes Brother   . Breast cancer Neg Hx     Social History   Socioeconomic History  . Marital status: Single    Spouse name: Not on file  . Number of children: 0  . Years of education: Not on file  . Highest education level: High school graduate  Occupational History  . Occupation: Training and development officer: [employer]  . Financial resource strain: Not hard at all  . Food insecurity:    Worry: Never true    Inability: Never true  . Transportation needs:    Medical: No    Non-medical: No  Tobacco Use  . Smoking status: Never Smoker  . Smokeless tobacco: Never Used  Substance and Sexual Activity  . Alcohol use: No    Alcohol/week: 0.0 standard drinks  . Drug use: No  . Sexual activity: Never  Lifestyle  . Physical activity:    Days per week: 0 days    Minutes per session: 0 min  . Stress: To some extent  Relationships  . Social connections:    Talks on phone: More than three times a week    Gets together: Never    Attends religious service: Never    Active member of club or organization: No    Attends meetings of clubs or organizations: Never    Relationship status: Never married  . Intimate partner violence:    Fear of current or ex partner: No    Emotionally abused: No    Physically abused: No    Forced sexual activity: No  Other Topics Concern  . Not on file  Social History Narrative   Lives alone, works at [employer] during the day and for [employer] at night, 5 nights weekly      Current Outpatient Medications:  .  ammonium lactate (LAC-HYDRIN) 12 % cream, Apply topically as needed for dry skin., Disp: 385 g, Rfl: 0 .  azelastine (ASTELIN) 0.1 % nasal spray, PLACE 2 SPRAYS INTO BOTH NOSTRILS 2 (TWO) TIMES DAILY. USE IN EACH NOSTRIL AS DIRECTED, Disp: 30 mL, Rfl: 1 .  Cholecalciferol (VITAMIN D) 2000 UNITS CAPS, Take 1 capsule by mouth as needed., Disp: , Rfl:  .  Cyanocobalamin (B-12) 1000 MCG SUBL, Place 1  tablet under the tongue daily., Disp: 30 each, Rfl: 0 .  fluticasone (FLONASE) 50 MCG/ACT nasal spray, PLACE 2 SPRAYS INTO BOTH NOSTRILS AS NEEDED FOR RHINITIS., Disp: 16 g, Rfl: 5 .  glucose blood (ONETOUCH VERIO) test strip, Reported on 10/02/2015, Disp: , Rfl:  .  levocetirizine (XYZAL) 5 MG tablet, Take 1 tablet (5 mg total) by mouth every evening., Disp: 90 tablet, Rfl: 2 .  meloxicam (MOBIC) 15 MG tablet, Take 1 tablet (15 mg total) by mouth daily. Make sure to take this with food., Disp: 30 tablet, Rfl: 0 .  montelukast (SINGULAIR) 10 MG tablet, Take 1 tablet (10 mg total) by mouth at bedtime., Disp: 90 tablet, Rfl: 0 .  Olopatadine HCl 0.2 % SOLN, INSTILL 1 DROP INTO BOTH EYES EVERY MORNING, Disp: , Rfl: 3 .  triamcinolone cream (KENALOG) 0.1 %, Apply topically 2 (two) times daily., Disp: 80 g, Rfl: 1  Current Facility-Administered Medications:  .  albuterol (PROVENTIL) (2.5 MG/3ML) 0.083% nebulizer solution 2.5 mg, 2.5 mg, Nebulization, Once, Poulose, Bethel Born, NP  Allergies  Allergen Reactions  . Bee Venom      ROS   Constitutional: Negative for fever or weight change.  Respiratory: Negative for cough and shortness of breath.   Cardiovascular: Negative for chest pain or palpitations.  Gastrointestinal: Negative for abdominal pain, no bowel changes.  Musculoskeletal: Negative for gait problem or joint swelling.  Skin: positive for intermittent  Rash - she will contact allergist again, never made appointment .  Neurological: Negative for dizziness or headache.  No other specific complaints in a complete review of systems (except as listed in HPI above).   Objective  Vitals:   12/14/17 1518  Pulse: 67  Resp: 14  Temp: 98.1 F (36.7 C)  TempSrc: Oral  SpO2: 97%  Weight: 208 lb 11.2 oz (94.7 kg)  Height: '5\' 5"'$  (1.651 m)    Body mass index is 34.73 kg/m.  Physical Exam  Constitutional: Patient appears well-developed and obese  No distress.  HENT: Head:  Normocephalic and atraumatic. Ears: B TMs ok, no erythema or effusion; Nose: Nose normal. Mouth/Throat: Oropharynx is clear and moist. No oropharyngeal exudate.  Eyes: Conjunctivae and EOM are normal. Pupils are equal, round, and reactive to light. No scleral icterus.  Neck: Normal range of motion. Neck supple. No JVD present. No thyromegaly present.  Cardiovascular: Normal rate, regular rhythm and normal heart sounds.  No murmur heard. No BLE edema. Pulmonary/Chest: Effort normal and breath sounds normal. No respiratory distress. Abdominal: Soft. Bowel sounds are normal, no distension. There is no tenderness. no masses Mild umbilical hernia Breast: no lumps or masses, no nipple discharge or rashes FEMALE GENITALIA:  External genitalia normal External urethra normal Vaginal vault normal without discharge or lesions Cervix normal without discharge or lesions Bimanual exam normal without masses RECTAL: not done Musculoskeletal: Normal range of motion, no joint effusions. No gross deformities Neurological: he is alert and oriented to person, place, and time. No cranial nerve deficit. Coordination, balance, strength, speech and gait are normal.  Skin: Skin is warm and dry. No rash noted. No erythema.  Psychiatric: Patient has a normal mood and affect. behavior is normal. Judgment and thought content normal.  PHQ2/9: Depression screen [facility] 2/9 12/14/2017 07/09/2017 09/14/2016 02/18/2016 01/01/2016  Decreased Interest 0 0 0 0 0  Down, Depressed, Hopeless 0 0 0 0 0  PHQ - 2 Score 0 0 0 0 0  Altered sleeping 0 - - - -  Tired, decreased energy 0 - - - -  Change in appetite 0 - - - -  Feeling bad or failure about yourself  0 - - - -  Trouble concentrating 0 - - - -  Moving slowly or fidgety/restless 0 - - - -  Suicidal thoughts 0 - - - -  PHQ-9 Score 0 - - - -  Difficult doing work/chores Not difficult at all - - - -     Fall Risk: Fall Risk  12/14/2017 07/09/2017 09/14/2016 02/18/2016 01/01/2016   Falls in the past year? No No No No No     Functional Status Survey: Is the patient deaf or have difficulty hearing?: No Does the patient have difficulty seeing, even when wearing glasses/contacts?: No Does the patient have difficulty concentrating, remembering, or making decisions?: No Does the patient have difficulty walking or climbing stairs?: No Does the patient have difficulty dressing or bathing?: No Does the patient have difficulty doing errands alone such as visiting a doctor's office or shopping?: No   Assessment & Plan  1. Well woman exam  - VITAMIN D 25 Hydroxy (Vit-D Deficiency, Fractures) - Vitamin B12 - Lipid panel - Hemoglobin A1c - CBC with Differential/Platelet - COMPLETE METABOLIC PANEL WITH GFR  2. Dyslipidemia  - Lipid panel - COMPLETE METABOLIC PANEL WITH GFR  3. Insulin resistance  - Hemoglobin A1c  4. B12 deficiency  - Vitamin B12  5. Vitamin D deficiency  - VITAMIN D 25 Hydroxy (Vit-D Deficiency, Fractures)  6. Need for shingles vaccine  - Varicella-zoster vaccine IM  7. Cervical cancer screening  - Cytology - PAP  -USPSTF grade A and B recommendations reviewed with patient; age-appropriate recommendations, preventive care, screening tests, etc discussed and encouraged; healthy living encouraged; see AVS for patient education given to patient -Discussed importance of 150 minutes of physical activity weekly, eat two servings of fish weekly, eat one serving of tree nuts ( cashews, pistachios, pecans, almonds.Marland Kitchen) every other day, eat 6 servings of fruit/vegetables daily and drink plenty of water and avoid sweet beverages.

## 2017-12-14 NOTE — Patient Instructions (Signed)
Preventive Care 40-64 Years, Female Preventive care refers to lifestyle choices and visits with your health care provider that can promote health and wellness. What does preventive care include?  A yearly physical exam. This is also called an annual well check.  Dental exams once or twice a year.  Routine eye exams. Ask your health care provider how often you should have your eyes checked.  Personal lifestyle choices, including: ? Daily care of your teeth and gums. ? Regular physical activity. ? Eating a healthy diet. ? Avoiding tobacco and drug use. ? Limiting alcohol use. ? Practicing safe sex. ? Taking low-dose aspirin daily starting at age 58. ? Taking vitamin and mineral supplements as recommended by your health care provider. What happens during an annual well check? The services and screenings done by your health care provider during your annual well check will depend on your age, overall health, lifestyle risk factors, and family history of disease. Counseling Your health care provider may ask you questions about your:  Alcohol use.  Tobacco use.  Drug use.  Emotional well-being.  Home and relationship well-being.  Sexual activity.  Eating habits.  Work and work Statistician.  Method of birth control.  Menstrual cycle.  Pregnancy history.  Screening You may have the following tests or measurements:  Height, weight, and BMI.  Blood pressure.  Lipid and cholesterol levels. These may be checked every 5 years, or more frequently if you are over 81 years old.  Skin check.  Lung cancer screening. You may have this screening every year starting at age 78 if you have a 30-pack-year history of smoking and currently smoke or have quit within the past 15 years.  Fecal occult blood test (FOBT) of the stool. You may have this test every year starting at age 65.  Flexible sigmoidoscopy or colonoscopy. You may have a sigmoidoscopy every 5 years or a colonoscopy  every 10 years starting at age 30.  Hepatitis C blood test.  Hepatitis B blood test.  Sexually transmitted disease (STD) testing.  Diabetes screening. This is done by checking your blood sugar (glucose) after you have not eaten for a while (fasting). You may have this done every 1-3 years.  Mammogram. This may be done every 1-2 years. Talk to your health care provider about when you should start having regular mammograms. This may depend on whether you have a family history of breast cancer.  BRCA-related cancer screening. This may be done if you have a family history of breast, ovarian, tubal, or peritoneal cancers.  Pelvic exam and Pap test. This may be done every 3 years starting at age 80. Starting at age 36, this may be done every 5 years if you have a Pap test in combination with an HPV test.  Bone density scan. This is done to screen for osteoporosis. You may have this scan if you are at high risk for osteoporosis.  Discuss your test results, treatment options, and if necessary, the need for more tests with your health care provider. Vaccines Your health care provider may recommend certain vaccines, such as:  Influenza vaccine. This is recommended every year.  Tetanus, diphtheria, and acellular pertussis (Tdap, Td) vaccine. You may need a Td booster every 10 years.  Varicella vaccine. You may need this if you have not been vaccinated.  Zoster vaccine. You may need this after age 5.  Measles, mumps, and rubella (MMR) vaccine. You may need at least one dose of MMR if you were born in  1957 or later. You may also need a second dose.  Pneumococcal 13-valent conjugate (PCV13) vaccine. You may need this if you have certain conditions and were not previously vaccinated.  Pneumococcal polysaccharide (PPSV23) vaccine. You may need one or two doses if you smoke cigarettes or if you have certain conditions.  Meningococcal vaccine. You may need this if you have certain  conditions.  Hepatitis A vaccine. You may need this if you have certain conditions or if you travel or work in places where you may be exposed to hepatitis A.  Hepatitis B vaccine. You may need this if you have certain conditions or if you travel or work in places where you may be exposed to hepatitis B.  Haemophilus influenzae type b (Hib) vaccine. You may need this if you have certain conditions.  Talk to your health care provider about which screenings and vaccines you need and how often you need them. This information is not intended to replace advice given to you by your health care provider. Make sure you discuss any questions you have with your health care provider. Document Released: 05/17/2015 Document Revised: 01/08/2016 Document Reviewed: 02/19/2015 Elsevier Interactive Patient Education  2018 [employer]  

## 2017-12-15 LAB — CBC WITH DIFFERENTIAL/PLATELET
BASOS ABS: 18 {cells}/uL (ref 0–200)
Basophils Relative: 0.2 %
EOS PCT: 0.7 %
Eosinophils Absolute: 64 cells/uL (ref 15–500)
HCT: 37.9 % (ref 35.0–45.0)
Hemoglobin: 12.7 g/dL (ref 11.7–15.5)
LYMPHS ABS: 2880 {cells}/uL (ref 850–3900)
MCH: 28.6 pg (ref 27.0–33.0)
MCHC: 33.5 g/dL (ref 32.0–36.0)
MCV: 85.4 fL (ref 80.0–100.0)
MPV: 10.2 fL (ref 7.5–12.5)
Monocytes Relative: 5.3 %
NEUTROS PCT: 62.5 %
Neutro Abs: 5750 cells/uL (ref 1500–7800)
PLATELETS: 282 10*3/uL (ref 140–400)
RBC: 4.44 10*6/uL (ref 3.80–5.10)
RDW: 13.3 % (ref 11.0–15.0)
TOTAL LYMPHOCYTE: 31.3 %
WBC mixed population: 488 cells/uL (ref 200–950)
WBC: 9.2 10*3/uL (ref 3.8–10.8)

## 2017-12-15 LAB — COMPLETE METABOLIC PANEL WITH GFR
AG RATIO: 1.4 (calc) (ref 1.0–2.5)
ALKALINE PHOSPHATASE (APISO): 145 U/L — AB (ref 33–130)
ALT: 19 U/L (ref 6–29)
AST: 17 U/L (ref 10–35)
Albumin: 4 g/dL (ref 3.6–5.1)
BUN: 11 mg/dL (ref 7–25)
CO2: 28 mmol/L (ref 20–32)
CREATININE: 0.74 mg/dL (ref 0.50–1.05)
Calcium: 9.3 mg/dL (ref 8.6–10.4)
Chloride: 103 mmol/L (ref 98–110)
GFR, Est African American: 109 mL/min/{1.73_m2} (ref >=60)
GFR, Est Non African American: 94 mL/min/{1.73_m2} (ref >=60)
GLOBULIN: 2.8 g/dL (ref 1.9–3.7)
Glucose, Bld: 93 mg/dL (ref 65–99)
POTASSIUM: 3.7 mmol/L (ref 3.5–5.3)
SODIUM: 138 mmol/L (ref 135–146)
Total Bilirubin: 0.4 mg/dL (ref 0.2–1.2)
Total Protein: 6.8 g/dL (ref 6.1–8.1)

## 2017-12-15 LAB — VITAMIN D 25 HYDROXY (VIT D DEFICIENCY, FRACTURES): VIT D 25 HYDROXY: 29 ng/mL — AB (ref 30–100)

## 2017-12-15 LAB — HEMOGLOBIN A1C
EAG (MMOL/L): 6.8 (calc)
HEMOGLOBIN A1C: 5.9 %{Hb} — AB (ref <5.7)
MEAN PLASMA GLUCOSE: 123 (calc)

## 2017-12-15 LAB — LIPID PANEL
CHOL/HDL RATIO: 4 (calc) (ref <5.0)
Cholesterol: 174 mg/dL (ref <200)
HDL: 44 mg/dL — ABNORMAL LOW (ref >50)
LDL Cholesterol (Calc): 114 mg/dL (calc) — ABNORMAL HIGH
NON-HDL CHOLESTEROL (CALC): 130 mg/dL — AB (ref <130)
Triglycerides: 72 mg/dL (ref <150)

## 2017-12-15 LAB — VITAMIN B12: Vitamin B-12: 661 pg/mL (ref 200–1100)

## 2017-12-16 LAB — CYTOLOGY - PAP
Diagnosis: NEGATIVE
HPV (WINDOPATH): NOT DETECTED

## 2018-01-18 ENCOUNTER — Other Ambulatory Visit: Payer: Self-pay | Admitting: Nurse Practitioner

## 2018-01-18 DIAGNOSIS — Z9189 Other specified personal risk factors, not elsewhere classified: Secondary | ICD-10-CM

## 2018-01-18 DIAGNOSIS — Z889 Allergy status to unspecified drugs, medicaments and biological substances status: Secondary | ICD-10-CM

## 2018-02-14 ENCOUNTER — Ambulatory Visit: Payer: BLUE CROSS/BLUE SHIELD | Admitting: Family Medicine

## 2018-02-14 ENCOUNTER — Encounter: Payer: Self-pay | Admitting: Family Medicine

## 2018-02-14 VITALS — BP 112/70 | HR 79 | Temp 98.5°F | Resp 16 | Ht 65.0 in | Wt 207.2 lb

## 2018-02-14 DIAGNOSIS — R5383 Other fatigue: Secondary | ICD-10-CM

## 2018-02-14 DIAGNOSIS — E8881 Metabolic syndrome: Secondary | ICD-10-CM

## 2018-02-14 DIAGNOSIS — E669 Obesity, unspecified: Secondary | ICD-10-CM | POA: Diagnosis not present

## 2018-02-14 DIAGNOSIS — R748 Abnormal levels of other serum enzymes: Secondary | ICD-10-CM

## 2018-02-14 DIAGNOSIS — Z23 Encounter for immunization: Secondary | ICD-10-CM

## 2018-02-14 DIAGNOSIS — F341 Dysthymic disorder: Secondary | ICD-10-CM

## 2018-02-14 NOTE — Progress Notes (Signed)
Name: Tamara Nelson   MRN: [id number]    DOB: Feb 18, 1966   Date:02/14/2018       Progress Note  Subjective  Chief Complaint  Chief Complaint  Patient presents with  . Follow-up    2 mth f/u  . Immunizations    shingles #2 and flu shot    HPI  Other Fatigue: she is still working two jobs, she is going to bed around midnight and getting up at 6 am every morning 5 days a week, she has weekend offs and is able to catch on sleep. She feels well during the weekends when she can sleep in. She states eating healthy, protein shakes and salads, drinking water and avoiding caffeine. She is having leg cramps at night, she has been having crying spells and losing her temper when very tired, sometimes feels like she is having a panic attack ( gets shaky and anxious) . She feels stiff in the morning but for only a few minutes, but she states joints don't feel right. Last labs reviewed and within normal limits. She is worried about low cortisol level and asked for labs.   Patient Active Problem List   Diagnosis Date Noted  . B12 deficiency 10/23/2015  . Dyslipidemia 10/21/2015  . Bee sting allergy 10/17/2014  . Abnormal serum level of alkaline phosphatase 10/17/2014  . Menopause 10/17/2014  . Irritant contact dermatitis 10/17/2014  . Dysmetabolic syndrome 10/17/2014  . Obesity (BMI 30-39.9) 10/17/2014  . Vitamin D deficiency 10/17/2014  . Allergic rhinitis, seasonal 10/17/2014    History reviewed. No pertinent surgical history.  Family History  Problem Relation Age of Onset  . Cancer Mother        colon  . Cancer Father        Liver and Lung  . Stroke Father   . Hypertension Brother   . Diabetes Brother   . Breast cancer Neg Hx     Social History   Socioeconomic History  . Marital status: Single    Spouse name: Not on file  . Number of children: 0  . Years of education: Not on file  . Highest education level: High school graduate  Occupational History  . Occupation: Designer, fashion/clothing: [employer]  Social Needs  . Financial resource strain: Not hard at all  . Food insecurity:    Worry: Never true    Inability: Never true  . Transportation needs:    Medical: No    Non-medical: No  Tobacco Use  . Smoking status: Never Smoker  . Smokeless tobacco: Never Used  Substance and Sexual Activity  . Alcohol use: No    Alcohol/week: 0.0 standard drinks  . Drug use: No  . Sexual activity: Never  Lifestyle  . Physical activity:    Days per week: 0 days    Minutes per session: 0 min  . Stress: To some extent  Relationships  . Social connections:    Talks on phone: More than three times a week    Gets together: Never    Attends religious service: Never    Active member of club or organization: No    Attends meetings of clubs or organizations: Never    Relationship status: Never married  . Intimate partner violence:    Fear of current or ex partner: No    Emotionally abused: No    Physically abused: No    Forced sexual activity: No  Other Topics Concern  . Not  on file  Social History Narrative   Lives alone, works at [employer] during the day and for [employer] at night, 5 nights weekly      Current Outpatient Medications:  .  ammonium lactate (LAC-HYDRIN) 12 % cream, Apply topically as needed for dry skin., Disp: 385 g, Rfl: 0 .  azelastine (ASTELIN) 0.1 % nasal spray, PLACE 2 SPRAYS INTO BOTH NOSTRILS 2 (TWO) TIMES DAILY. USE IN EACH NOSTRIL AS DIRECTED, Disp: 30 mL, Rfl: 1 .  Cholecalciferol (VITAMIN D) 2000 UNITS CAPS, Take 1 capsule by mouth as needed., Disp: , Rfl:  .  Cyanocobalamin (B-12) 1000 MCG SUBL, Place 1 tablet under the tongue daily., Disp: 30 each, Rfl: 0 .  fluticasone (FLONASE) 50 MCG/ACT nasal spray, PLACE 2 SPRAYS INTO BOTH NOSTRILS AS NEEDED FOR RHINITIS., Disp: 16 g, Rfl: 5 .  glucose blood (ONETOUCH VERIO) test strip, Reported on 10/02/2015, Disp: , Rfl:  .  levocetirizine (XYZAL) 5 MG tablet,  Take 1 tablet (5 mg total) by mouth every evening., Disp: 90 tablet, Rfl: 2 .  meloxicam (MOBIC) 15 MG tablet, Take 1 tablet (15 mg total) by mouth daily. Make sure to take this with food., Disp: 30 tablet, Rfl: 0 .  montelukast (SINGULAIR) 10 MG tablet, TAKE 1 TABLET BY MOUTH EVERYDAY AT BEDTIME, Disp: 90 tablet, Rfl: 0 .  Olopatadine HCl 0.2 % SOLN, INSTILL 1 DROP INTO BOTH EYES EVERY MORNING, Disp: , Rfl: 3 .  triamcinolone cream (KENALOG) 0.1 %, Apply topically 2 (two) times daily., Disp: 80 g, Rfl: 1  Current Facility-Administered Medications:  .  albuterol (PROVENTIL) (2.5 MG/3ML) 0.083% nebulizer solution 2.5 mg, 2.5 mg, Nebulization, Once, Poulose, Percell Belt, NP  Allergies  Allergen Reactions  . Bee Venom     I personally reviewed active problem list, medication list, allergies, family history, social history with the patient/caregiver today.   ROS  Constitutional: Negative for fever or weight change.  Respiratory: Negative for cough and shortness of breath.   Cardiovascular: Negative for chest pain or palpitations.  Gastrointestinal: Negative for abdominal pain, no bowel changes.  Musculoskeletal: Negative for gait problem or joint swelling.  Skin: Negative for rash.  Neurological: Negative for dizziness or headache.  No other specific complaints in a complete review of systems (except as listed in HPI above).  Objective  Vitals:   02/14/18 1529  BP: 112/70  Pulse: 79  Resp: 16  Temp: 98.5 F (36.9 C)  TempSrc: Oral  SpO2: 97%  Weight: 207 lb 3.2 oz (94 kg)  Height: 5\' 5"  (1.651 m)    Body mass index is 34.48 kg/m.  Physical Exam  Constitutional: Patient appears well-developed and well-nourished. Obese  No distress.  HEENT: head atraumatic, normocephalic, pupils equal and reactive to light,neck supple, throat within normal limits Cardiovascular: Normal rate, regular rhythm and normal heart sounds.  No murmur heard. No BLE edema. Pulmonary/Chest: Effort  normal and breath sounds normal. No respiratory distress. Abdominal: Soft.  There is no tenderness. Muscular Skeletal: no synovitis, normal rom of major joints.  Psychiatric: Patient has a normal mood and affect. behavior is normal. Judgment and thought content normal.  Recent Results (from the past 2160 hour(s))  Cytology - PAP     Status: None   Collection Time: 12/14/17 12:00 AM  Result Value Ref Range   Adequacy      Satisfactory for evaluation  endocervical/transformation zone component PRESENT.   Diagnosis      NEGATIVE FOR INTRAEPITHELIAL LESIONS OR MALIGNANCY.  HPV NOT DETECTED     Comment: Normal Reference Range - NOT Detected   Material Submitted CervicoVaginal Pap [ThinPrep Imaged]    CYTOLOGY - PAP PAP RESULT   VITAMIN D 25 Hydroxy (Vit-D Deficiency, Fractures)     Status: Abnormal   Collection Time: 12/14/17  3:27 PM  Result Value Ref Range   Vit D, 25-Hydroxy 29 (L) 30 - 100 ng/mL    Comment: Vitamin D Status         25-OH Vitamin D: . Deficiency:                    <20 ng/mL Insufficiency:             20 - 29 ng/mL Optimal:                 > or = 30 ng/mL . For 25-OH Vitamin D testing on patients on  D2-supplementation and patients for whom quantitation  of D2 and D3 fractions is required, the QuestAssureD(TM) 25-OH VIT D, (D2,D3), LC/MS/MS is recommended: order  code 78295 (patients >21yrs). . For more information on this test, go to: http://education.quest[facility].com/faq/FAQ163 (This link is being provided for  informational/educational purposes only.)   Vitamin B12     Status: None   Collection Time: 12/14/17  3:27 PM  Result Value Ref Range   Vitamin B-12 661 200 - 1,100 pg/mL  Lipid panel     Status: Abnormal   Collection Time: 12/14/17  3:27 PM  Result Value Ref Range   Cholesterol 174 <200 mg/dL   HDL 44 (L) >62 mg/dL   Triglycerides 72 <130 mg/dL   LDL Cholesterol (Calc) 114 (H) mg/dL (calc)    Comment: Reference range: <100 . Desirable  range <100 mg/dL for primary prevention;   <70 mg/dL for patients with CHD or diabetic patients  with > or = 2 CHD risk factors. Marland Kitchen LDL-C is now calculated using the Martin-Hopkins  calculation, which is a validated novel method providing  better accuracy than the Friedewald equation in the  estimation of LDL-C.  Horald Pollen et al. Lenox Ahr. 8657;846(96): 2061-2068  (http://education.[facility].com/faq/FAQ164)    Total CHOL/HDL Ratio 4.0 <5.0 (calc)   Non-HDL Cholesterol (Calc) 130 (H) <130 mg/dL (calc)    Comment: For patients with diabetes plus 1 major ASCVD risk  factor, treating to a non-HDL-C goal of <100 mg/dL  (LDL-C of <29 mg/dL) is considered a therapeutic  option.   Hemoglobin A1c     Status: Abnormal   Collection Time: 12/14/17  3:27 PM  Result Value Ref Range   Hgb A1c MFr Bld 5.9 (H) <5.7 % of total Hgb    Comment: For someone without known diabetes, a hemoglobin  A1c value between 5.7% and 6.4% is consistent with prediabetes and should be confirmed with a  follow-up test. . For someone with known diabetes, a value <7% indicates that their diabetes is well controlled. A1c targets should be individualized based on duration of diabetes, age, comorbid conditions, and other considerations. . This assay result is consistent with an increased risk of diabetes. . Currently, no consensus exists regarding use of hemoglobin A1c for diagnosis of diabetes for children. .    Mean Plasma Glucose 123 (calc)   eAG (mmol/L) 6.8 (calc)  CBC with Differential/Platelet     Status: None   Collection Time: 12/14/17  3:27 PM  Result Value Ref Range   WBC 9.2 3.8 - 10.8 Thousand/uL   RBC 4.44 3.80 - 5.10 Million/uL  Hemoglobin 12.7 11.7 - 15.5 g/dL   HCT 16.1 09.6 - 04.5 %   MCV 85.4 80.0 - 100.0 fL   MCH 28.6 27.0 - 33.0 pg   MCHC 33.5 32.0 - 36.0 g/dL   RDW 40.9 81.1 - 91.4 %   Platelets 282 140 - 400 Thousand/uL   MPV 10.2 7.5 - 12.5 fL   Neutro Abs 5,750 1,500 - 7,800  cells/uL   Lymphs Abs 2,880 850 - 3,900 cells/uL   WBC mixed population 488 200 - 950 cells/uL   Eosinophils Absolute 64 15 - 500 cells/uL   Basophils Absolute 18 0 - 200 cells/uL   Neutrophils Relative % 62.5 %   Total Lymphocyte 31.3 %   Monocytes Relative 5.3 %   Eosinophils Relative 0.7 %   Basophils Relative 0.2 %  COMPLETE METABOLIC PANEL WITH GFR     Status: Abnormal   Collection Time: 12/14/17  3:27 PM  Result Value Ref Range   Glucose, Bld 93 65 - 99 mg/dL    Comment: .            Fasting reference interval .    BUN 11 7 - 25 mg/dL   Creat 7.82 9.56 - 2.13 mg/dL    Comment: For patients >36 years of age, the reference limit for Creatinine is approximately 13% higher for people identified as African-American. .    GFR, Est Non African American 94 > OR = 60 mL/min/1.61m2   GFR, Est African American 109 > OR = 60 mL/min/1.72m2   BUN/Creatinine Ratio NOT APPLICABLE 6 - 22 (calc)   Sodium 138 135 - 146 mmol/L   Potassium 3.7 3.5 - 5.3 mmol/L   Chloride 103 98 - 110 mmol/L   CO2 28 20 - 32 mmol/L   Calcium 9.3 8.6 - 10.4 mg/dL   Total Protein 6.8 6.1 - 8.1 g/dL   Albumin 4.0 3.6 - 5.1 g/dL   Globulin 2.8 1.9 - 3.7 g/dL (calc)   AG Ratio 1.4 1.0 - 2.5 (calc)   Total Bilirubin 0.4 0.2 - 1.2 mg/dL   Alkaline phosphatase (APISO) 145 (H) 33 - 130 U/L   AST 17 10 - 35 U/L   ALT 19 6 - 29 U/L     PHQ2/9: Depression screen [facility] 2/9 02/14/2018 12/14/2017 07/09/2017 09/14/2016 02/18/2016  Decreased Interest 2 0 0 0 0  Down, Depressed, Hopeless 1 0 0 0 0  PHQ - 2 Score 3 0 0 0 0  Altered sleeping 1 0 - - -  Tired, decreased energy 3 0 - - -  Change in appetite 0 0 - - -  Feeling bad or failure about yourself  1 0 - - -  Trouble concentrating 1 0 - - -  Moving slowly or fidgety/restless 0 0 - - -  Suicidal thoughts 0 0 - - -  PHQ-9 Score 9 0 - - -  Difficult doing work/chores Very difficult Not difficult at all - - -     Fall Risk: Fall Risk  02/14/2018 12/14/2017  07/09/2017 09/14/2016 02/18/2016  Falls in the past year? No No No No No     Functional Status Survey: Is the patient deaf or have difficulty hearing?: No Does the patient have difficulty seeing, even when wearing glasses/contacts?: No Does the patient have difficulty concentrating, remembering, or making decisions?: No Does the patient have difficulty walking or climbing stairs?: No Does the patient have difficulty dressing or bathing?: No Does the patient have difficulty doing errands  alone such as visiting a doctor's office or shopping?: No    Assessment & Plan   1. Other fatigue  Explained likely sleep deprivation  - Cortisol  2. Need for shingles vaccine  - Varicella-zoster vaccine IM  3. Insulin resistance   4. Obesity (BMI 30.0-34.9)  - Thyroid Panel With TSH  5. Dysmetabolic syndrome   6. Elevated alkaline phosphatase level  - Hepatic function panel   7. Dysthymia  Discussed trying to cut down on second job, it may help her sleep and increase her level of energy, discussed medication for depression but she wants to hold off at this time, discussed physical activity also that may improve energy.

## 2018-02-15 DIAGNOSIS — R5383 Other fatigue: Secondary | ICD-10-CM | POA: Diagnosis not present

## 2018-02-15 DIAGNOSIS — R748 Abnormal levels of other serum enzymes: Secondary | ICD-10-CM | POA: Diagnosis not present

## 2018-02-16 LAB — HEPATIC FUNCTION PANEL
AG Ratio: 1.7 (calc) (ref 1.0–2.5)
ALT: 19 U/L (ref 6–29)
AST: 13 U/L (ref 10–35)
Albumin: 4.2 g/dL (ref 3.6–5.1)
Alkaline phosphatase (APISO): 147 U/L — ABNORMAL HIGH (ref 33–130)
BILIRUBIN INDIRECT: 0.3 mg/dL (ref 0.2–1.2)
BILIRUBIN TOTAL: 0.4 mg/dL (ref 0.2–1.2)
Bilirubin, Direct: 0.1 mg/dL (ref 0.0–0.2)
Globulin: 2.5 g/dL (calc) (ref 1.9–3.7)
Total Protein: 6.7 g/dL (ref 6.1–8.1)

## 2018-02-16 LAB — THYROID PANEL WITH TSH
Free Thyroxine Index: 1.9 (ref 1.4–3.8)
T3 Uptake: 26 % (ref 22–35)
T4, Total: 7.4 ug/dL (ref 5.1–11.9)
TSH: 2.18 m[IU]/L

## 2018-02-16 LAB — CORTISOL: CORTISOL PLASMA: 13.8 ug/dL

## 2018-03-05 ENCOUNTER — Emergency Department: Payer: BLUE CROSS/BLUE SHIELD

## 2018-03-05 ENCOUNTER — Other Ambulatory Visit: Payer: Self-pay

## 2018-03-05 ENCOUNTER — Emergency Department
Admission: EM | Admit: 2018-03-05 | Discharge: 2018-03-06 | Disposition: A | Discharge status: Home / Self Care | Payer: BLUE CROSS/BLUE SHIELD | Attending: Emergency Medicine | Admitting: Emergency Medicine

## 2018-03-05 DIAGNOSIS — Z79899 Other long term (current) drug therapy: Secondary | ICD-10-CM | POA: Diagnosis not present

## 2018-03-05 DIAGNOSIS — N200 Calculus of kidney: Secondary | ICD-10-CM | POA: Insufficient documentation

## 2018-03-05 DIAGNOSIS — R1084 Generalized abdominal pain: Secondary | ICD-10-CM | POA: Diagnosis present

## 2018-03-05 DIAGNOSIS — N132 Hydronephrosis with renal and ureteral calculous obstruction: Secondary | ICD-10-CM | POA: Diagnosis not present

## 2018-03-05 LAB — CBC
HCT: 41.7 % (ref 36.0–46.0)
HEMOGLOBIN: 13.5 g/dL (ref 12.0–15.0)
MCH: 28.7 pg (ref 26.0–34.0)
MCHC: 32.4 g/dL (ref 30.0–36.0)
MCV: 88.7 fL (ref 80.0–100.0)
Platelets: 275 10*3/uL (ref 150–400)
RBC: 4.7 MIL/uL (ref 3.87–5.11)
RDW: 12.9 % (ref 11.5–15.5)
WBC: 11.4 10*3/uL — AB (ref 4.0–10.5)
nRBC: 0 % (ref 0.0–0.2)

## 2018-03-05 LAB — URINALYSIS, COMPLETE (UACMP) WITH MICROSCOPIC
BILIRUBIN URINE: NEGATIVE
Bacteria, UA: NONE SEEN
GLUCOSE, UA: NEGATIVE mg/dL
HGB URINE DIPSTICK: NEGATIVE
KETONES UR: 20 mg/dL — AB
NITRITE: NEGATIVE
PROTEIN: NEGATIVE mg/dL
Specific Gravity, Urine: 1.012 (ref 1.005–1.030)
pH: 7 (ref 5.0–8.0)

## 2018-03-05 LAB — BASIC METABOLIC PANEL
Anion gap: 13 (ref 5–15)
BUN: 14 mg/dL (ref 6–20)
CALCIUM: 9.2 mg/dL (ref 8.9–10.3)
CO2: 23 mmol/L (ref 22–32)
CREATININE: 0.64 mg/dL (ref 0.44–1.00)
Chloride: 104 mmol/L (ref 98–111)
GFR calc Af Amer: >60 mL/min (ref >60)
Glucose, Bld: 109 mg/dL — ABNORMAL HIGH (ref 70–99)
POTASSIUM: 3.4 mmol/L — AB (ref 3.5–5.1)
SODIUM: 140 mmol/L (ref 135–145)

## 2018-03-05 LAB — HEPATIC FUNCTION PANEL
ALBUMIN: 4.3 g/dL (ref 3.5–5.0)
ALK PHOS: 127 U/L — AB (ref 38–126)
ALT: 27 U/L (ref 0–44)
AST: 23 U/L (ref 15–41)
Bilirubin, Direct: <0.1 mg/dL (ref 0.0–0.2)
TOTAL PROTEIN: 7.4 g/dL (ref 6.5–8.1)
Total Bilirubin: 0.6 mg/dL (ref 0.3–1.2)

## 2018-03-05 MED ORDER — CEPHALEXIN 500 MG PO CAPS
500.0000 mg | ORAL_CAPSULE | Freq: Once | ORAL | Status: AC
  Administered 2018-03-05: 500 mg via ORAL
  Filled 2018-03-05: qty 1

## 2018-03-05 MED ORDER — IBUPROFEN 600 MG PO TABS
600.0000 mg | ORAL_TABLET | Freq: Once | ORAL | Status: AC
  Administered 2018-03-05: 600 mg via ORAL
  Filled 2018-03-05: qty 1

## 2018-03-05 NOTE — ED Provider Notes (Signed)
[facility] Emergency Department Provider Note  ____________________________________________  Time seen: Approximately 9:05 PM  I have reviewed the triage vital signs and the nursing notes.   HISTORY  Chief Complaint Flank Pain   HPI Tamara Nelson is a 52 y.o. female no significant past medical history presents for evaluation of right flank pain.  Patient reports 2 to 3 days of urinary frequency, and dysuria.  She is also complaining of dull right flank and right-sided abdominal pain which has been present for the last 2 days, currently 6 out of 10.  No nausea, vomiting, fever, chills.  No prior episodes of kidney stones, pyelonephritis, or UTI.  No prior abdominal surgeries.  No constipation or diarrhea.  Past Medical History:  Diagnosis Date  . Alkaline phosphatase raised   . Allergy   . Irritant contact dermatitis   . Metabolic syndrome   . Vitamin D deficiency     Patient Active Problem List   Diagnosis Date Noted  . B12 deficiency 10/23/2015  . Dyslipidemia 10/21/2015  . Bee sting allergy 10/17/2014  . Abnormal serum level of alkaline phosphatase 10/17/2014  . Menopause 10/17/2014  . Irritant contact dermatitis 10/17/2014  . Dysmetabolic syndrome 10/17/2014  . Obesity (BMI 30-39.9) 10/17/2014  . Vitamin D deficiency 10/17/2014  . Allergic rhinitis, seasonal 10/17/2014    History reviewed. No pertinent surgical history.  Prior to Admission medications   Medication Sig Start Date End Date Taking? Authorizing Provider  ammonium lactate (LAC-HYDRIN) 12 % cream Apply topically as needed for dry skin. 01/01/16   Alba Cory, MD  azelastine (ASTELIN) 0.1 % nasal spray PLACE 2 SPRAYS INTO BOTH NOSTRILS 2 (TWO) TIMES DAILY. USE IN EACH NOSTRIL AS DIRECTED 10/16/17   Carlynn Purl, Danna Hefty, MD  cephALEXin (KEFLEX) 500 MG capsule Take 1 capsule (500 mg total) by mouth 3 (three) times daily for 7 days. 03/06/18 03/13/18  Nita Sickle, MD    Cholecalciferol (VITAMIN D) 2000 UNITS CAPS Take 1 capsule by mouth as needed. 06/20/10   [provider]  Cyanocobalamin (B-12) 1000 MCG SUBL Place 1 tablet under the tongue daily. 10/23/15   Sowles, Danna Hefty, MD  fluticasone (FLONASE) 50 MCG/ACT nasal spray PLACE 2 SPRAYS INTO BOTH NOSTRILS AS NEEDED FOR RHINITIS. 04/20/17   Alba Cory, MD  glucose blood (ONETOUCH VERIO) test strip Reported on 10/02/2015 10/14/12   [provider]  levocetirizine (XYZAL) 5 MG tablet Take 1 tablet (5 mg total) by mouth every evening. 05/12/17   Doren Custard, FNP  meloxicam (MOBIC) 15 MG tablet Take 1 tablet (15 mg total) by mouth daily. Make sure to take this with food. 09/03/17   Sowles, Danna Hefty, MD  montelukast (SINGULAIR) 10 MG tablet TAKE 1 TABLET BY MOUTH EVERYDAY AT BEDTIME 01/18/18   Sowles, Danna Hefty, MD  Olopatadine HCl 0.2 % SOLN INSTILL 1 DROP INTO BOTH EYES EVERY MORNING 06/02/16   [provider]  triamcinolone cream (KENALOG) 0.1 % Apply topically 2 (two) times daily. 12/01/16   Alba Cory, MD    Allergies Bee venom  Family History  Problem Relation Age of Onset  . Cancer Mother        colon  . Cancer Father        Liver and Lung  . Stroke Father   . Hypertension Brother   . Diabetes Brother   . Breast cancer Neg Hx     Social History Social History   Tobacco Use  . Smoking status: Never Smoker  .  Smokeless tobacco: Never Used  Substance Use Topics  . Alcohol use: No    Alcohol/week: 0.0 standard drinks  . Drug use: No    Review of Systems  Constitutional: Negative for fever. Eyes: Negative for visual changes. ENT: Negative for sore throat. Neck: No neck pain  Cardiovascular: Negative for chest pain. Respiratory: Negative for shortness of breath. Gastrointestinal: + r sided abdominal pain. No vomiting or diarrhea. Genitourinary: + dysuria, frequency, R flank pain Musculoskeletal: Negative for back pain. Skin: Negative for rash. Neurological:  Negative for headaches, weakness or numbness. Psych: No SI or HI  ____________________________________________   PHYSICAL EXAM:  VITAL SIGNS: ED Triage Vitals  Enc Vitals Group     BP 03/05/18 1725 (!) 148/73     Pulse Rate 03/05/18 1725 69     Resp 03/05/18 2032 17     Temp 03/05/18 1725 98.1 F (36.7 C)     Temp Source 03/05/18 1725 Oral     SpO2 03/05/18 1725 99 %     Weight 03/05/18 1725 207 lb (93.9 kg)     Height 03/05/18 1725 5\' 5"  (1.651 m)     Head Circumference --      Peak Flow --      Pain Score 03/05/18 1730 8     Pain Loc --      Pain Edu? --      Excl. in GC? --     Constitutional: Alert and oriented. Well appearing and in no apparent distress. HEENT:      Head: Normocephalic and atraumatic.         Eyes: Conjunctivae are normal. Sclera is non-icteric.       Mouth/Throat: Mucous membranes are moist.       Neck: Supple with no signs of meningismus. Cardiovascular: Regular rate and rhythm. No murmurs, gallops, or rubs. 2+ symmetrical distal pulses are present in all extremities. No JVD. Respiratory: Normal respiratory effort. Lungs are clear to auscultation bilaterally. No wheezes, crackles, or rhonchi.  Gastrointestinal: Soft, non tender, and non distended with positive bowel sounds. No rebound or guarding. Genitourinary: No CVA tenderness. Musculoskeletal: Nontender with normal range of motion in all extremities. No edema, cyanosis, or erythema of extremities. Neurologic: Normal speech and language. Face is symmetric. Moving all extremities. No gross focal neurologic deficits are appreciated. Skin: Skin is warm, dry and intact. No rash noted. Psychiatric: Mood and affect are normal. Speech and behavior are normal.  ____________________________________________   LABS (all labs ordered are listed, but only abnormal results are displayed)  Labs Reviewed  URINALYSIS, COMPLETE (UACMP) WITH MICROSCOPIC - Abnormal; Notable for the following components:       Result Value   Color, Urine STRAW (*)    APPearance CLEAR (*)    Ketones, ur 20 (*)    Leukocytes, UA TRACE (*)    All other components within normal limits  BASIC METABOLIC PANEL - Abnormal; Notable for the following components:   Potassium 3.4 (*)    Glucose, Bld 109 (*)    All other components within normal limits  CBC - Abnormal; Notable for the following components:   WBC 11.4 (*)    All other components within normal limits  HEPATIC FUNCTION PANEL - Abnormal; Notable for the following components:   Alkaline Phosphatase 127 (*)    All other components within normal limits  URINE CULTURE   ____________________________________________  EKG  none  ____________________________________________  RADIOLOGY  I have personally reviewed the images performed during this visit  and I agree with the Radiologist's read.   Interpretation by Radiologist:  Ct Renal Stone Study  Result Date: 03/05/2018 CLINICAL DATA:  Right flank pain. EXAM: CT ABDOMEN AND PELVIS WITHOUT CONTRAST TECHNIQUE: Multidetector CT imaging of the abdomen and pelvis was performed following the standard protocol without IV contrast. COMPARISON:  None. FINDINGS: Lower chest: No acute abnormality. Hepatobiliary: No focal liver abnormality is seen. No gallstones, gallbladder wall thickening, or biliary dilatation. Pancreas: Unremarkable. No pancreatic ductal dilatation or surrounding inflammatory changes. Spleen: Normal in size without focal abnormality. Adrenals/Urinary Tract: Adrenal glands are normal. No left-sided stones. Two stones are seen in the right kidney. The largest in the lower pole measures 5 mm. There is very mild hydronephrosis on the right. The right ureter is also more prominent than the left due to a stone in the distal right ureter measuring 4 mm. The kidneys, ureters, and bladder are otherwise normal. Stomach/Bowel: The stomach and small bowel are normal. Colonic diverticulosis is seen without  diverticulitis. The visualized appendix is normal. Vascular/Lymphatic: No significant vascular findings are present. No enlarged abdominal or pelvic lymph nodes. Reproductive: Uterus and bilateral adnexa are unremarkable. Other: No abdominal wall hernia or abnormality. No abdominopelvic ascites. Musculoskeletal: No acute or significant osseous findings. IMPRESSION: 1. Mild right hydronephrosis and ureterectasis due to a 4 mm stone in the distal right ureter, explain the patient's symptoms. 2. Two other stones are seen in the right kidney. No left-sided stones. 3. Colonic diverticulosis without diverticulitis. Electronically Signed   By: Gerome Sam III M.D   On: 03/05/2018 22:21      ____________________________________________   PROCEDURES  Procedure(s) performed: None Procedures Critical Care performed:  None ____________________________________________   INITIAL IMPRESSION / ASSESSMENT AND PLAN / ED COURSE   52 y.o. female no significant past medical history presents for evaluation of dysuria, frequency, right flank pain and R sided abdominal pain.  Patient is well-appearing, no distress, she is has normal vital signs, no fever, abdomen is soft with no tenderness to palpation, no flank tenderness.  Labs showing mild leukocytosis with white count of 11.4.  UA with trace leuks, RBCs and WBCs but no bacteria or nitrites.  Patient's clinical picture is consistent with a UTI vs kidney stone.  No signs of sepsis.  Will start patient on Keflex.  Urine culture has been sent.  CT renal has been ordered to rule out kidney stone.    _________________________ 1:38 AM on 03/06/2018 ----------------------------------------- CT consistent with a 4 mm stone in the distal right ureter.  On reevaluation patient's pain is fully resolved most likely due to stone passage into the bladder.  Patient was sent home with a prescription of Keflex due to symptoms of dysuria although low suspicion for infection at  this time.  Culture is pending.  Discussed return precautions and close follow-up with urology.    As part of my medical decision making, I reviewed the following data within the electronic MEDICAL RECORD NUMBER Nursing notes reviewed and incorporated, Labs reviewed , Old chart reviewed, Radiograph reviewed , Notes from prior ED visits and [place] Controlled Substance Database    Pertinent labs & imaging results that were available during my care of the patient were reviewed by me and considered in my medical decision making (see chart for details).    ____________________________________________   FINAL CLINICAL IMPRESSION(S) / ED DIAGNOSES  Final diagnoses:  Kidney stone      NEW MEDICATIONS STARTED DURING THIS VISIT:  ED Discharge Orders  Ordered    cephALEXin (KEFLEX) 500 MG capsule  3 times daily,   Status:  Discontinued     03/06/18 0052    cephALEXin (KEFLEX) 500 MG capsule  3 times daily     03/06/18 0052           Note:  This document was prepared using Dragon voice recognition software and may include unintentional dictation errors.    Nita Sickle, MD 03/06/18 [phone]

## 2018-03-05 NOTE — ED Triage Notes (Signed)
Pt comes via POV from home with c/o right sided flank pain and burning and pain with urination. Pt denies any odor. Pt states 8/10 pain. Pt denies any N/V.

## 2018-03-05 NOTE — ED Notes (Signed)
Patient c/o right flank pain, radiating to abdomen, dysuria, and difficulty initiating urinary stream. Patient denies malodorous urine, N/V.

## 2018-03-06 MED ORDER — CEPHALEXIN 500 MG PO CAPS: 500.0000 mg | ORAL_CAPSULE | Freq: Three times a day (TID) | ORAL | 0 refills | Status: AC

## 2018-03-06 MED ORDER — CEPHALEXIN 500 MG PO CAPS: 500.0000 mg | ORAL_CAPSULE | Freq: Three times a day (TID) | ORAL | 0 refills | Status: DC

## 2018-03-06 NOTE — ED Notes (Signed)
Reviewed discharge instructions, follow-up care, and prescriptions with patient. Patient verbalized understanding of all information reviewed. Patient stable, with no distress noted at this time.    

## 2018-03-06 NOTE — Discharge Instructions (Addendum)

## 2018-03-06 NOTE — ED Notes (Signed)
ED Provider at bedside. 

## 2018-03-07 LAB — URINE CULTURE: Culture: 40000 — AB

## 2018-03-17 ENCOUNTER — Ambulatory Visit: Payer: BLUE CROSS/BLUE SHIELD | Admitting: Family Medicine

## 2018-03-17 ENCOUNTER — Encounter: Payer: Self-pay | Admitting: Family Medicine

## 2018-03-17 VITALS — BP 120/72 | HR 72 | Temp 97.9°F | Resp 16 | Ht 65.0 in | Wt 201.4 lb

## 2018-03-17 DIAGNOSIS — N200 Calculus of kidney: Secondary | ICD-10-CM

## 2018-03-17 DIAGNOSIS — K5790 Diverticulosis of intestine, part unspecified, without perforation or abscess without bleeding: Secondary | ICD-10-CM | POA: Diagnosis not present

## 2018-03-17 NOTE — Progress Notes (Signed)
Name: Tamara Nelson   MRN: [id number]    DOB: 06/26/1965   Date:03/17/2018       Progress Note  Subjective  Chief Complaint  Chief Complaint  Patient presents with  . Follow-up    ER for kidney stones    HPI  PT presents to follow up on ER visit for nephrolithiasis.  She was seen 03/05/2018 - had no prior history until this visit.  She did pass a stone and brings it in today.  She notes some dyuria ongoing; denies back/flank pain, frank hematuria, nausea, or vomiting. She wants to wait to see urology at this time - wants to see if her dysuria resolves after a few more days.  - She did change her diet recently - eating and drinking a lot more protein and stopped drinking mountain dew and tea. - CT scan found diverticulosis and 3 renal stones.  Urine culture foundn Viridans Strep; CBC showed mildly elevated WBC, Potassium was slightly low, but BMP was otherwise normal.  Patient Active Problem List   Diagnosis Date Noted  . B12 deficiency 10/23/2015  . Dyslipidemia 10/21/2015  . Bee sting allergy 10/17/2014  . Abnormal serum level of alkaline phosphatase 10/17/2014  . Menopause 10/17/2014  . Irritant contact dermatitis 10/17/2014  . Dysmetabolic syndrome 10/17/2014  . Obesity (BMI 30-39.9) 10/17/2014  . Vitamin D deficiency 10/17/2014  . Allergic rhinitis, seasonal 10/17/2014    Social History   Tobacco Use  . Smoking status: Never Smoker  . Smokeless tobacco: Never Used  Substance Use Topics  . Alcohol use: No    Alcohol/week: 0.0 standard drinks     Current Outpatient Medications:  .  ammonium lactate (LAC-HYDRIN) 12 % cream, Apply topically as needed for dry skin., Disp: 385 g, Rfl: 0 .  azelastine (ASTELIN) 0.1 % nasal spray, PLACE 2 SPRAYS INTO BOTH NOSTRILS 2 (TWO) TIMES DAILY. USE IN EACH NOSTRIL AS DIRECTED, Disp: 30 mL, Rfl: 1 .  Cholecalciferol (VITAMIN D) 2000 UNITS CAPS, Take 1 capsule by mouth as needed., Disp: , Rfl:  .  Cyanocobalamin (B-12) 1000  MCG SUBL, Place 1 tablet under the tongue daily., Disp: 30 each, Rfl: 0 .  fluticasone (FLONASE) 50 MCG/ACT nasal spray, PLACE 2 SPRAYS INTO BOTH NOSTRILS AS NEEDED FOR RHINITIS., Disp: 16 g, Rfl: 5 .  glucose blood (ONETOUCH VERIO) test strip, Reported on 10/02/2015, Disp: , Rfl:  .  levocetirizine (XYZAL) 5 MG tablet, Take 1 tablet (5 mg total) by mouth every evening., Disp: 90 tablet, Rfl: 2 .  meloxicam (MOBIC) 15 MG tablet, Take 1 tablet (15 mg total) by mouth daily. Make sure to take this with food., Disp: 30 tablet, Rfl: 0 .  montelukast (SINGULAIR) 10 MG tablet, TAKE 1 TABLET BY MOUTH EVERYDAY AT BEDTIME, Disp: 90 tablet, Rfl: 0 .  Olopatadine HCl 0.2 % SOLN, INSTILL 1 DROP INTO BOTH EYES EVERY MORNING, Disp: , Rfl: 3 .  triamcinolone cream (KENALOG) 0.1 %, Apply topically 2 (two) times daily., Disp: 80 g, Rfl: 1  Current Facility-Administered Medications:  .  albuterol (PROVENTIL) (2.5 MG/3ML) 0.083% nebulizer solution 2.5 mg, 2.5 mg, Nebulization, Once, Poulose, Percell Belt, NP  Allergies  Allergen Reactions  . Bee Venom     I personally reviewed active problem list, medication list, notes from last encounter, lab results with the patient/caregiver today.  ROS  Constitutional: Negative for fever or weight change.  Respiratory: Negative for cough and shortness of breath.   Cardiovascular: Negative for chest  pain or palpitations.  Gastrointestinal: Negative for abdominal pain, no bowel changes.  Musculoskeletal: Negative for gait problem or joint swelling.  Skin: Negative for rash.  Neurological: Negative for dizziness or headache.  No other specific complaints in a complete review of systems (except as listed in HPI above).  Objective  Vitals:   03/17/18 1354  BP: 120/72  Pulse: 72  Resp: 16  Temp: 97.9 F (36.6 C)  TempSrc: Oral  SpO2: 99%  Weight: 201 lb 6.4 oz (91.4 kg)  Height: 5\' 5"  (1.651 m)   Body mass index is 33.51 kg/m.  Nursing Note and Vital Signs  reviewed.  Physical Exam  Constitutional: Patient appears well-developed and well-nourished. No distress.  HENT: Head: Normocephalic and atraumatic. Eyes: Conjunctivae and EOM are normal. No scleral icterus.  Neck: Normal range of motion. Neck supple. No JVD present.  Cardiovascular: Normal rate, regular rhythm and normal heart sounds.  No murmur heard. No BLE edema. Pulmonary/Chest: Effort normal and breath sounds normal. No respiratory distress. Abdominal: Soft. Bowel sounds are normal, no distension. There is no tenderness. No masses. No CVA tenderness. Musculoskeletal: Normal range of motion, no joint effusions. No gross deformities Neurological: Pt is alert and oriented to person, place, and time. No cranial nerve deficit. Coordination, balance, strength, speech and gait are normal.  Skin: Skin is warm and dry. No rash noted. No erythema.  Psychiatric: Patient has a normal mood and affect. behavior is normal. Judgment and thought content normal.  No results found for this or any previous visit (from the past 72 hour(s)).  Assessment & Plan  1. Nephrolithiasis - Declines referral to Urology today; will call back by 11.25.2019 if not improving and we will provide referral.   2. Diverticulosis Noted in her history.  -Red flags and when to present for emergency care or RTC including fever >101.30F, chest pain, shortness of breath, new/worsening/un-resolving symptoms, flank/back pain, hematuria reviewed with patient at time of visit. Follow up and care instructions discussed and provided in AVS.

## 2018-03-17 NOTE — Patient Instructions (Signed)
Call for Urology Referral if not improving by 03/28/2018 - call sooner if needed.

## 2018-03-28 ENCOUNTER — Ambulatory Visit: Payer: BLUE CROSS/BLUE SHIELD | Admitting: Family Medicine

## 2018-04-26 ENCOUNTER — Other Ambulatory Visit: Payer: Self-pay | Admitting: Family Medicine

## 2018-04-26 NOTE — Telephone Encounter (Signed)
Refill request for general medication: Flonase nasal spray  Last office visit: 03/17/2018  Last physical exam: 12/14/2017  Follow-ups on file. None indicated

## 2018-06-25 ENCOUNTER — Other Ambulatory Visit: Payer: Self-pay | Admitting: Family Medicine

## 2018-06-25 DIAGNOSIS — J302 Other seasonal allergic rhinitis: Secondary | ICD-10-CM

## 2019-01-23 ENCOUNTER — Encounter: Payer: Self-pay | Admitting: Family Medicine

## 2019-01-23 ENCOUNTER — Other Ambulatory Visit: Payer: Self-pay

## 2019-01-23 ENCOUNTER — Ambulatory Visit (INDEPENDENT_AMBULATORY_CARE_PROVIDER_SITE_OTHER): Payer: BC Managed Care – PPO | Admitting: Family Medicine

## 2019-01-23 VITALS — BP 106/70 | HR 76 | Temp 97.3°F | Resp 16 | Ht 64.5 in | Wt 201.0 lb

## 2019-01-23 DIAGNOSIS — Z23 Encounter for immunization: Secondary | ICD-10-CM | POA: Diagnosis not present

## 2019-01-23 DIAGNOSIS — Z0000 Encounter for general adult medical examination without abnormal findings: Secondary | ICD-10-CM

## 2019-01-23 DIAGNOSIS — E785 Hyperlipidemia, unspecified: Secondary | ICD-10-CM

## 2019-01-23 DIAGNOSIS — Z1159 Encounter for screening for other viral diseases: Secondary | ICD-10-CM | POA: Diagnosis not present

## 2019-01-23 DIAGNOSIS — Z889 Allergy status to unspecified drugs, medicaments and biological substances status: Secondary | ICD-10-CM

## 2019-01-23 DIAGNOSIS — E538 Deficiency of other specified B group vitamins: Secondary | ICD-10-CM

## 2019-01-23 DIAGNOSIS — J302 Other seasonal allergic rhinitis: Secondary | ICD-10-CM

## 2019-01-23 DIAGNOSIS — E559 Vitamin D deficiency, unspecified: Secondary | ICD-10-CM

## 2019-01-23 DIAGNOSIS — E8881 Metabolic syndrome: Secondary | ICD-10-CM | POA: Diagnosis not present

## 2019-01-23 DIAGNOSIS — Z87442 Personal history of urinary calculi: Secondary | ICD-10-CM

## 2019-01-23 DIAGNOSIS — Z9189 Other specified personal risk factors, not elsewhere classified: Secondary | ICD-10-CM

## 2019-01-23 MED ORDER — FLUTICASONE PROPIONATE 50 MCG/ACT NA SUSP: 2.0000 | Freq: Every day | NASAL | 1 refills | Status: DC

## 2019-01-23 MED ORDER — OLOPATADINE HCL 0.2 % OP SOLN: OPHTHALMIC | 3 refills | Status: DC

## 2019-01-23 MED ORDER — LEVOCETIRIZINE DIHYDROCHLORIDE 5 MG PO TABS: 5.0000 mg | ORAL_TABLET | Freq: Every evening | ORAL | 1 refills | Status: DC

## 2019-01-23 MED ORDER — MONTELUKAST SODIUM 10 MG PO TABS: 10.0000 mg | ORAL_TABLET | Freq: Every day | ORAL | 0 refills | Status: DC

## 2019-01-23 NOTE — Progress Notes (Signed)
Name: Tamara Nelson   MRN: [id number]    DOB: 1966-04-15   Date:01/23/2019       Progress Note  Subjective  Chief Complaint  Chief Complaint  Patient presents with  . Well woman exam    HPI  Patient presents for annual CPE and follow up  Insulin resistance: she stopped checking her glucose at home, she denies polyphagia, polydipsia or polyuria  Nephrolithiasis: had an episodes 03/2018 but no episodes since, did not see nephrologist at the time:   Dyslipidemia: reviewed last labs and we will recheck it today The 10-year ASCVD risk score Mikey Bussing DC Brooke Bonito., et al., 2013) is: 1.1%   Values used to calculate the score:     Age: 53 years     Sex: Female     Is Non-Hispanic African American: No     Diabetic: No     Tobacco smoker: No     Systolic Blood Pressure: 614 mmHg     Is BP treated: No     HDL Cholesterol: 44 mg/dL     Total Cholesterol: 174 mg/dL   B12 and Vitamin D deficiency: taking otc supplementation   Diet: cutting down on mountain dew from 6 daily to one daily and also having less sweet tea, not even daily, about once a week, she is avoid eating fast food Exercise: just the yard   USPSTF grade A and B recommendations    Office Visit from 01/23/2019 in [facility]  AUDIT-C Score  0     Depression: Phq 9 is  negative Depression screen [facility] 2/9 01/23/2019 03/17/2018 02/14/2018 12/14/2017 07/09/2017  Decreased Interest 0 0 2 0 0  Down, Depressed, Hopeless 0 0 1 0 0  PHQ - 2 Score 0 0 3 0 0  Altered sleeping 0 0 1 0 -  Tired, decreased energy 0 0 3 0 -  Change in appetite 0 0 0 0 -  Feeling bad or failure about yourself  0 0 1 0 -  Trouble concentrating 0 0 1 0 -  Moving slowly or fidgety/restless 0 0 0 0 -  Suicidal thoughts 0 0 0 0 -  PHQ-9 Score 0 0 9 0 -  Difficult doing work/chores Not difficult at all Not difficult at all Very difficult Not difficult at all -   Hypertension: BP Readings from Last 3 Encounters:  01/23/19 106/70   03/17/18 120/72  03/06/18 (!) 162/69   Obesity: Wt Readings from Last 3 Encounters:  01/23/19 201 lb (91.2 kg)  03/17/18 201 lb 6.4 oz (91.4 kg)  03/05/18 207 lb (93.9 kg)   BMI Readings from Last 3 Encounters:  01/23/19 33.97 kg/m  03/17/18 33.51 kg/m  03/05/18 34.45 kg/m     Hep C Screening: today STD testing and prevention (HIV/chl/gon/syphilis): N/A Intimate partner violence:negative screen  Sexual History/Pain during Intercourse: not sexually active  Menstrual History/LMP/Abnormal Bleeding: post-menopausal , discussed need to return for follow up if any bleeding  Incontinence Symptoms: no symptoms   Breast cancer:  - Last Mammogram: 01/2017 - BRCA gene screening: N/A  Osteoporosis Screening: discussed high calcium and vitamin D diet   Cervical cancer screening: normal 2019 repeat in 5 years   Skin cancer: discussed atypical lesions  Colorectal cancer: done 2017  Lung cancer:   Low Dose CT Chest recommended if Age 61-80 years, 30 pack-year currently smoking OR have quit w/in 15years. Patient does not qualify.   ERX:[id number]   Advanced Care Planning: A voluntary discussion about  advance care planning including the explanation and discussion of advance directives.  Discussed health care proxy and Living will, and the patient was able to identify a health care proxy as a friend, but not sure who and will decide and get forms filled out .  Patient does not have a living will at present time.   Lipids: Lab Results  Component Value Date   CHOL 174 12/14/2017   CHOL 160 10/26/2016   CHOL 167 10/21/2015   Lab Results  Component Value Date   HDL 44 (L) 12/14/2017   HDL 38 (L) 10/26/2016   HDL 41 (L) 10/21/2015   Lab Results  Component Value Date   LDLCALC 114 (H) 12/14/2017   LDLCALC 107 (H) 10/26/2016   LDLCALC 107 10/21/2015   Lab Results  Component Value Date   TRIG 72 12/14/2017   TRIG 75 10/26/2016   TRIG 95 10/21/2015   Lab Results  Component Value  Date   CHOLHDL 4.0 12/14/2017   CHOLHDL 4.2 10/26/2016   CHOLHDL 4.1 10/21/2015   No results found for: LDLDIRECT  Glucose: Glucose, Bld  Date Value Ref Range Status  03/05/2018 109 (H) 70 - 99 mg/dL Final  12/14/2017 93 65 - 99 mg/dL Final    Comment:    .            Fasting reference interval .   09/14/2016 92 65 - 99 mg/dL Final    Patient Active Problem List   Diagnosis Date Noted  . Diverticulosis 03/17/2018  . Nephrolithiasis 03/17/2018  . B12 deficiency 10/23/2015  . Dyslipidemia 10/21/2015  . Bee sting allergy 10/17/2014  . Abnormal serum level of alkaline phosphatase 10/17/2014  . Menopause 10/17/2014  . Irritant contact dermatitis 10/17/2014  . Dysmetabolic syndrome 26/71/2458  . Obesity (BMI 30-39.9) 10/17/2014  . Vitamin D deficiency 10/17/2014  . Allergic rhinitis, seasonal 10/17/2014    History reviewed. No pertinent surgical history.  Family History  Problem Relation Age of Onset  . Cancer Mother        colon  . Cancer Father        Liver and Lung  . Stroke Father   . Hypertension Brother   . Diabetes Brother   . Diabetes Brother   . Breast cancer Neg Hx     Social History   Socioeconomic History  . Marital status: Single    Spouse name: Not on file  . Number of children: 0  . Years of education: Not on file  . Highest education level: High school graduate  Occupational History  . Occupation: Training and development officer: [employer]  . Financial resource strain: Not hard at all  . Food insecurity    Worry: Never true    Inability: Never true  . Transportation needs    Medical: No    Non-medical: No  Tobacco Use  . Smoking status: Never Smoker  . Smokeless tobacco: Never Used  Substance and Sexual Activity  . Alcohol use: No    Alcohol/week: 0.0 standard drinks  . Drug use: No  . Sexual activity: Never  Lifestyle  . Physical activity    Days per week: 0 days    Minutes per session: 0 min  . Stress:  Not at all  Relationships  . Social connections    Talks on phone: More than three times a week    Gets together: Never    Attends religious service: Never    Active  member of club or organization: No    Attends meetings of clubs or organizations: Never    Relationship status: Never married  . Intimate partner violence    Fear of current or ex partner: No    Emotionally abused: No    Physically abused: No    Forced sexual activity: No  Other Topics Concern  . Not on file  Social History Narrative   Lives alone, works at [employer] during the day and for [employer] at night, 5 nights weekly      Current Outpatient Medications:  .  ammonium lactate (LAC-HYDRIN) 12 % cream, Apply topically as needed for dry skin., Disp: 385 g, Rfl: 0 .  Cholecalciferol (VITAMIN D) 2000 UNITS CAPS, Take 1 capsule by mouth as needed., Disp: , Rfl:  .  Cyanocobalamin (B-12) 1000 MCG SUBL, Place 1 tablet under the tongue daily., Disp: 30 each, Rfl: 0 .  fluticasone (FLONASE) 50 MCG/ACT nasal spray, PLACE 2 SPRAYS INTO BOTH NOSTRILS AS NEEDED FOR RHINITIS., Disp: 48 g, Rfl: 1 .  levocetirizine (XYZAL) 5 MG tablet, TAKE 1 TABLET BY MOUTH EVERY DAY IN THE EVENING, Disp: 90 tablet, Rfl: 1 .  meloxicam (MOBIC) 15 MG tablet, Take 1 tablet (15 mg total) by mouth daily. Make sure to take this with food., Disp: 30 tablet, Rfl: 0 .  montelukast (SINGULAIR) 10 MG tablet, TAKE 1 TABLET BY MOUTH EVERYDAY AT BEDTIME, Disp: 90 tablet, Rfl: 0 .  Olopatadine HCl 0.2 % SOLN, INSTILL 1 DROP INTO BOTH EYES EVERY MORNING, Disp: , Rfl: 3 .  triamcinolone cream (KENALOG) 0.1 %, Apply topically 2 (two) times daily., Disp: 80 g, Rfl: 1 .  azelastine (ASTELIN) 0.1 % nasal spray, PLACE 2 SPRAYS INTO BOTH NOSTRILS 2 (TWO) TIMES DAILY. USE IN EACH NOSTRIL AS DIRECTED (Patient not taking: Reported on 01/23/2019), Disp: 30 mL, Rfl: 1 .  glucose blood (ONETOUCH VERIO) test strip, Reported on 10/02/2015, Disp: , Rfl:    Current Facility-Administered Medications:  .  albuterol (PROVENTIL) (2.5 MG/3ML) 0.083% nebulizer solution 2.5 mg, 2.5 mg, Nebulization, Once, Poulose, Bethel Born, NP  Allergies  Allergen Reactions  . Bee Venom      ROS  Constitutional: Negative for fever or weight change.  Respiratory: Negative for cough and shortness of breath.   Cardiovascular: Negative for chest pain or palpitations.  Gastrointestinal: Negative for abdominal pain, no bowel changes.  Musculoskeletal: Negative for gait problem or joint swelling.  Skin: Negative for rash.  Neurological: Negative for dizziness or headache.  No other specific complaints in a complete review of systems (except as listed in HPI above).   Objective  Vitals:   01/23/19 1509  BP: 106/70  Pulse: 76  Resp: 16  Temp: (!) 97.3 F (36.3 C)  TempSrc: Temporal  SpO2: 97%  Weight: 201 lb (91.2 kg)  Height: 5' 4.5" (1.638 m)    Body mass index is 33.97 kg/m.  Physical Exam  Constitutional: Patient appears well-developed and well-nourished, obese.  No distress.  HENT: Head: Normocephalic and atraumatic. Ears: B TMs ok, no erythema or effusion; Nose: Nose normal. Mouth/Throat: Oropharynx is clear and moist. No oropharyngeal exudate.  Eyes: Conjunctivae and EOM are normal. Pupils are equal, round, and reactive to light. No scleral icterus.  Neck: Normal range of motion. Neck supple. No JVD present. No thyromegaly present.  Cardiovascular: Normal rate, regular rhythm and normal heart sounds.  No murmur heard. No BLE edema. Pulmonary/Chest: Effort normal and breath sounds normal. No respiratory  distress. Abdominal: Soft. Bowel sounds are normal, no distension. There is no tenderness. no masses Breast: no lumps or masses, no nipple discharge or rashes FEMALE GENITALIA:  External genitalia normal External urethra normal Pelvic not done  RECTAL: not done Musculoskeletal: Normal range of motion, no joint effusions. No gross  deformities Neurological: he is alert and oriented to person, place, and time. No cranial nerve deficit. Coordination, balance, strength, speech and gait are normal.  Skin: Skin is warm and dry. No rash noted. No erythema.  Psychiatric: Patient has a normal mood and affect. behavior is normal. Judgment and thought content normal.  Fall Risk: Fall Risk  01/23/2019 03/17/2018 02/14/2018 12/14/2017 07/09/2017  Falls in the past year? 0 0 No No No  Number falls in past yr: 0 - - - -  Injury with Fall? 0 - - - -     Functional Status Survey: Is the patient deaf or have difficulty hearing?: No Does the patient have difficulty seeing, even when wearing glasses/contacts?: No Does the patient have difficulty concentrating, remembering, or making decisions?: No Does the patient have difficulty walking or climbing stairs?: No Does the patient have difficulty dressing or bathing?: No Does the patient have difficulty doing errands alone such as visiting a doctor's office or shopping?: No   Assessment & Plan  1. Well adult exam  - COMPLETE METABOLIC PANEL WITH GFR  2. Dysmetabolic syndrome  - COMPLETE METABOLIC PANEL WITH GFR  3. Insulin resistance  - COMPLETE METABOLIC PANEL WITH GFR - Hemoglobin A1c  4. Dyslipidemia  - COMPLETE METABOLIC PANEL WITH GFR - Lipid panel  5.B12 deficiency  - CBC with Differential/Platelet - Vitamin B12  6. Vitamin D deficiency  - VITAMIN D 25 Hydroxy (Vit-D Deficiency, Fractures)  7. History of nephrolithiasis  No episodes since 03/2018   8. Need for hepatitis C screening test  - Hepatitis C antibody  9. Need for immunization against influenza  - Flu Vaccine QUAD 36+ mos IM -USPSTF grade A and B recommendations reviewed with patient; age-appropriate recommendations, preventive care, screening tests, etc discussed and encouraged; healthy living encouraged; see AVS for patient education given to patient -Discussed importance of 150 minutes of  physical activity weekly, eat two servings of fish weekly, eat one serving of tree nuts ( cashews, pistachios, pecans, almonds.Marland Kitchen) every other day, eat 6 servings of fruit/vegetables daily and drink plenty of water and avoid sweet beverages.

## 2019-01-23 NOTE — Patient Instructions (Signed)

## 2019-01-24 LAB — COMPLETE METABOLIC PANEL WITH GFR
AG Ratio: 1.4 (calc) (ref 1.0–2.5)
ALT: 17 U/L (ref 6–29)
AST: 16 U/L (ref 10–35)
Albumin: 4.1 g/dL (ref 3.6–5.1)
Alkaline phosphatase (APISO): 151 U/L (ref 37–153)
BUN: 12 mg/dL (ref 7–25)
CO2: 23 mmol/L (ref 20–32)
Calcium: 9.4 mg/dL (ref 8.6–10.4)
Chloride: 106 mmol/L (ref 98–110)
Creat: 0.71 mg/dL (ref 0.50–1.05)
GFR, Est African American: 113 mL/min/{1.73_m2} (ref >=60)
GFR, Est Non African American: 98 mL/min/{1.73_m2} (ref >=60)
Globulin: 2.9 g/dL (calc) (ref 1.9–3.7)
Glucose, Bld: 91 mg/dL (ref 65–99)
Potassium: 3.7 mmol/L (ref 3.5–5.3)
Sodium: 140 mmol/L (ref 135–146)
Total Bilirubin: 0.5 mg/dL (ref 0.2–1.2)
Total Protein: 7 g/dL (ref 6.1–8.1)

## 2019-01-24 LAB — CBC WITH DIFFERENTIAL/PLATELET
Absolute Monocytes: 471 cells/uL (ref 200–950)
Basophils Absolute: 23 cells/uL (ref 0–200)
Basophils Relative: 0.3 %
Eosinophils Absolute: 84 cells/uL (ref 15–500)
Eosinophils Relative: 1.1 %
HCT: 38.3 % (ref 35.0–45.0)
Hemoglobin: 13.1 g/dL (ref 11.7–15.5)
Lymphs Abs: 2782 cells/uL (ref 850–3900)
MCH: 29 pg (ref 27.0–33.0)
MCHC: 34.2 g/dL (ref 32.0–36.0)
MCV: 84.7 fL (ref 80.0–100.0)
MPV: 10.2 fL (ref 7.5–12.5)
Monocytes Relative: 6.2 %
Neutro Abs: 4241 cells/uL (ref 1500–7800)
Neutrophils Relative %: 55.8 %
Platelets: 266 10*3/uL (ref 140–400)
RBC: 4.52 10*6/uL (ref 3.80–5.10)
RDW: 13.4 % (ref 11.0–15.0)
Total Lymphocyte: 36.6 %
WBC: 7.6 10*3/uL (ref 3.8–10.8)

## 2019-01-24 LAB — LIPID PANEL
Cholesterol: 177 mg/dL (ref <200)
HDL: 46 mg/dL — ABNORMAL LOW (ref >=50)
LDL Cholesterol (Calc): 116 mg/dL (calc) — ABNORMAL HIGH
Non-HDL Cholesterol (Calc): 131 mg/dL (calc) — ABNORMAL HIGH (ref <130)
Total CHOL/HDL Ratio: 3.8 (calc) (ref <5.0)
Triglycerides: 54 mg/dL (ref <150)

## 2019-01-24 LAB — HEMOGLOBIN A1C
Hgb A1c MFr Bld: 5.7 % of total Hgb — ABNORMAL HIGH (ref <5.7)
Mean Plasma Glucose: 117 (calc)
eAG (mmol/L): 6.5 (calc)

## 2019-01-24 LAB — HEPATITIS C ANTIBODY
Hepatitis C Ab: NONREACTIVE
SIGNAL TO CUT-OFF: 0.01 (ref <1.00)

## 2019-01-24 LAB — VITAMIN D 25 HYDROXY (VIT D DEFICIENCY, FRACTURES): Vit D, 25-Hydroxy: 31 ng/mL (ref 30–100)

## 2019-01-24 LAB — VITAMIN B12: Vitamin B-12: 545 pg/mL (ref 200–1100)

## 2019-01-25 ENCOUNTER — Telehealth: Payer: Self-pay | Admitting: Family Medicine

## 2019-01-25 MED ORDER — EPINEPHRINE 0.3 MG/0.3ML IJ SOAJ: 0.3000 mg | INTRAMUSCULAR | 1 refills | Status: DC | PRN

## 2019-01-25 NOTE — Telephone Encounter (Signed)
Pt was seen earlier this week and she was thinking that you where going to give her a prescription for a epi pen. please send to [email or web address]

## 2019-04-13 ENCOUNTER — Telehealth: Payer: Self-pay | Admitting: Family Medicine

## 2019-04-13 DIAGNOSIS — L853 Xerosis cutis: Secondary | ICD-10-CM

## 2019-04-13 NOTE — Telephone Encounter (Signed)
Patient requesting to ammonium lactate (LAC-HYDRIN) 12 % cream for her dry hands,   [id number] - [place], [place] - [address]

## 2019-04-14 MED ORDER — AMMONIUM LACTATE 12 % EX CREA: TOPICAL_CREAM | CUTANEOUS | 0 refills | Status: DC | PRN

## 2019-07-24 ENCOUNTER — Ambulatory Visit: Payer: BC Managed Care – PPO | Admitting: Family Medicine

## 2019-10-23 ENCOUNTER — Other Ambulatory Visit: Payer: Self-pay | Admitting: Family Medicine

## 2019-10-23 ENCOUNTER — Ambulatory Visit: Payer: Self-pay

## 2019-10-23 DIAGNOSIS — J302 Other seasonal allergic rhinitis: Secondary | ICD-10-CM

## 2019-10-23 MED ORDER — EPINEPHRINE 0.3 MG/0.3ML IJ SOAJ: 0.3000 mg | INTRAMUSCULAR | 1 refills | Status: DC | PRN

## 2019-10-23 MED ORDER — FLUTICASONE PROPIONATE 50 MCG/ACT NA SUSP: 2.0000 | Freq: Every day | NASAL | 1 refills | Status: DC

## 2019-10-23 MED ORDER — OLOPATADINE HCL 0.2 % OP SOLN: OPHTHALMIC | 3 refills | Status: DC

## 2019-10-23 NOTE — Telephone Encounter (Signed)
Medication Refill - Medication: Fluticasone, Amoxicillin (Not Active on List), Olopatadine, Epi Pen  Has the patient contacted their pharmacy? Yes.   (Agent: If no, request that the patient contact the pharmacy for the refill.) (Agent: If yes, when and what did the pharmacy advise?)  Preferred Pharmacy (with phone number or street name):  [id number] - [place], [place] - [address] Phone:  [phone]  Fax:  [phone]       Agent: Please be advised that RX refills may take up to 3 business days. We ask that you follow-up with your pharmacy.

## 2019-10-23 NOTE — Telephone Encounter (Signed)
Please see triage note

## 2019-10-23 NOTE — Telephone Encounter (Signed)
Pt requesting Amoxicillin: runny nose, watery eyes,dry throat. Drainage is clear and slight blood tinged. No fever. No facial pain. Slight dizziness with bending over. Taking Benadryl 50 mg every 4-6 hours. Advised to run humidifier, drink plenty of fluids, and report fever or color change in discharge. Pt still wanting abx despite home care disposition. Appt made for tomorrow am  Reason for Disposition . [1] Sinus congestion as part of a cold AND [2] present < 10 days    Pt requesting amoxicillin rx  Answer Assessment - Initial Assessment Questions 1. LOCATION: "Where does it hurt?"      No facial pain or Headache 2. ONSET: "When did the sinus pain start?"  (e.g., hours, days)      Friday of last week 3. SEVERITY: "How bad is the pain?"   (Scale 1-10; mild, moderate or severe)   - MILD (1-3): doesn't interfere with normal activities    - MODERATE (4-7): interferes with normal activities (e.g., work or school) or awakens from sleep   - SEVERE (8-10): excruciating pain and patient unable to do any normal activities        n/a 4. RECURRENT SYMPTOM: "Have you ever had sinus problems before?" If Yes, ask: "When was the last time?" and "What happened that time?"      Yes- was placed on abx 5. NASAL CONGESTION: "Is the nose blocked?" If Yes, ask: "Can you open it or must you breathe through the mouth?"     No-no 6. NASAL DISCHARGE: "Do you have discharge from your nose?" If so ask, "What color?"     Clear to blood tinged 7. FEVER: "Do you have a fever?" If Yes, ask: "What is it, how was it measured, and when did it start?"      no 8. OTHER SYMPTOMS: "Do you have any other symptoms?" (e.g., sore throat, cough, earache, difficulty breathing)     Dry throat, mild dizziness from bending over  9. PREGNANCY: "Is there any chance you are pregnant?" "When was your last menstrual period?" No-n/a  Protocols used: SINUS PAIN OR CONGESTION-A-AH

## 2019-10-24 ENCOUNTER — Other Ambulatory Visit: Payer: Self-pay

## 2019-10-24 ENCOUNTER — Ambulatory Visit: Payer: BC Managed Care – PPO | Admitting: Family Medicine

## 2019-10-24 ENCOUNTER — Encounter: Payer: Self-pay | Admitting: Family Medicine

## 2019-10-24 VITALS — BP 110/80 | HR 71 | Temp 97.1°F | Resp 16 | Ht 64.5 in | Wt 203.0 lb

## 2019-10-24 DIAGNOSIS — Z87442 Personal history of urinary calculi: Secondary | ICD-10-CM

## 2019-10-24 DIAGNOSIS — F439 Reaction to severe stress, unspecified: Secondary | ICD-10-CM

## 2019-10-24 DIAGNOSIS — E559 Vitamin D deficiency, unspecified: Secondary | ICD-10-CM

## 2019-10-24 DIAGNOSIS — E669 Obesity, unspecified: Secondary | ICD-10-CM

## 2019-10-24 DIAGNOSIS — J302 Other seasonal allergic rhinitis: Secondary | ICD-10-CM

## 2019-10-24 DIAGNOSIS — Z889 Allergy status to unspecified drugs, medicaments and biological substances status: Secondary | ICD-10-CM

## 2019-10-24 DIAGNOSIS — Z9189 Other specified personal risk factors, not elsewhere classified: Secondary | ICD-10-CM

## 2019-10-24 DIAGNOSIS — E8881 Metabolic syndrome: Secondary | ICD-10-CM | POA: Diagnosis not present

## 2019-10-24 DIAGNOSIS — E785 Hyperlipidemia, unspecified: Secondary | ICD-10-CM | POA: Diagnosis not present

## 2019-10-24 DIAGNOSIS — E538 Deficiency of other specified B group vitamins: Secondary | ICD-10-CM

## 2019-10-24 DIAGNOSIS — Z1231 Encounter for screening mammogram for malignant neoplasm of breast: Secondary | ICD-10-CM

## 2019-10-24 DIAGNOSIS — E88819 Insulin resistance, unspecified: Secondary | ICD-10-CM

## 2019-10-24 DIAGNOSIS — E66811 Obesity, class 1: Secondary | ICD-10-CM

## 2019-10-24 MED ORDER — MONTELUKAST SODIUM 10 MG PO TABS: 10.0000 mg | ORAL_TABLET | Freq: Every day | ORAL | 3 refills | Status: DC

## 2019-10-24 MED ORDER — LEVOCETIRIZINE DIHYDROCHLORIDE 5 MG PO TABS: 5.0000 mg | ORAL_TABLET | Freq: Every evening | ORAL | 3 refills | Status: DC

## 2019-10-24 NOTE — Progress Notes (Signed)
Name: Tamara Nelson   MRN: [id number]    DOB: [birth year]   Date:10/24/2019       Progress Note  Subjective  Chief Complaint  Chief Complaint  Patient presents with  . Sinusitis    She has had symptoms of sinusitis x 6 days. Symptoms include runny nose, itchy eyes, dry throat and cough and nasal drainage at night. She has been taking Benedryl with poor improvement.    HPI  AR: she has been out of Flonase  She noticed itchy eyes, clear rhinorrhea, sensation that she needs to clear her throat, and over the weekend had post-nasal drainage and a cough. No fever, chills. No nausea or vomiting. Denies hives. She states Summer is the time that her allergies is worse. She states took some Benadryl and it improved symptoms but makes her sleepy .   Insulin resistance: she stopped checking her glucose at home, she denies polyphagia, polydipsia or polyuria. Last A1C was 5.7 % and we will recheck during her wellness exam. She is drinking only one [facility] a day , she still drinks a large tea daily. She has two older brothers that have diabetes and wants to do better.   Nephrolithiasis: had an episodes 03/2018 but no episodes since, did not see nephrologist at the time: Denies any dysuria or hematuria   Dyslipidemia: reviewed last labs  The 10-year ASCVD risk score Denman George DC Montez Hageman., et al., 2013) is: 1.3%   Values used to calculate the score:     Age: 54 years     Sex: Female     Is Non-Hispanic African American: No     Diabetic: No     Tobacco smoker: No     Systolic Blood Pressure: 110 mmHg     Is BP treated: No     HDL Cholesterol: 46 mg/dL     Total Cholesterol: 177 mg/dL   N23 and Vitamin D deficiency: taking otc supplementation but not on a regular basis.   Bee allergies: she needs a refill of her epipen    Patient Active Problem List   Diagnosis Date Noted  . Diverticulosis 03/17/2018  . Nephrolithiasis 03/17/2018  . B12 deficiency 10/23/2015  . Dyslipidemia 10/21/2015   . Bee sting allergy 10/17/2014  . Abnormal serum level of alkaline phosphatase 10/17/2014  . Menopause 10/17/2014  . Irritant contact dermatitis 10/17/2014  . Dysmetabolic syndrome 10/17/2014  . Obesity (BMI 30-39.9) 10/17/2014  . Vitamin D deficiency 10/17/2014  . Allergic rhinitis, seasonal 10/17/2014    History reviewed. No pertinent surgical history.  Family History  Problem Relation Age of Onset  . Cancer Mother        colon  . Cancer Father        Liver and Lung  . Stroke Father   . Hypertension Brother   . Diabetes Brother   . Diabetes Brother   . Breast cancer Neg Hx     Social History   Tobacco Use  . Smoking status: Never Smoker  . Smokeless tobacco: Never Used  Substance Use Topics  . Alcohol use: No    Alcohol/week: 0.0 standard drinks     Current Outpatient Medications:  .  ammonium lactate (LAC-HYDRIN) 12 % cream, Apply topically as needed for dry skin., Disp: 385 g, Rfl: 0 .  Cholecalciferol (VITAMIN D) 2000 UNITS CAPS, Take 1 capsule by mouth as needed., Disp: , Rfl:  .  Cyanocobalamin (B-12) 1000 MCG SUBL, Place 1 tablet under the tongue daily., Disp: 30  each, Rfl: 0 .  EPINEPHrine (EPIPEN 2-PAK) 0.3 mg/0.3 mL IJ SOAJ injection, Inject 0.3 mLs (0.3 mg total) into the muscle as needed for anaphylaxis., Disp: 1 each, Rfl: 1 .  fluticasone (FLONASE) 50 MCG/ACT nasal spray, Place 2 sprays into both nostrils daily., Disp: 48 g, Rfl: 1 .  Olopatadine HCl 0.2 % SOLN, INSTILL 1 DROP INTO BOTH EYES EVERY MORNING, Disp: 2.5 mL, Rfl: 3 .  triamcinolone cream (KENALOG) 0.1 %, Apply topically 2 (two) times daily., Disp: 80 g, Rfl: 1 .  levocetirizine (XYZAL) 5 MG tablet, Take 1 tablet (5 mg total) by mouth every evening. (Patient not taking: Reported on 10/24/2019), Disp: 90 tablet, Rfl: 1 .  montelukast (SINGULAIR) 10 MG tablet, Take 1 tablet (10 mg total) by mouth at bedtime. (Patient not taking: Reported on 10/24/2019), Disp: 90 tablet, Rfl: 0  Allergies   Allergen Reactions  . Bee Venom     I personally reviewed active problem list, medication list, allergies, family history, social history, health maintenance with the patient/caregiver today.   ROS  Constitutional: Negative for fever or weight change.  Respiratory: positive  for cough but no  shortness of breath.   Cardiovascular: Negative for chest pain or palpitations.  Gastrointestinal: Negative for abdominal pain, no bowel changes.  Musculoskeletal: Negative for gait problem or joint swelling.  Skin: Negative for rash.  Neurological: Negative for dizziness or headache.  No other specific complaints in a complete review of systems (except as listed in HPI above).   Objective  Vitals:   10/24/19 1034  BP: 110/80  Pulse: 71  Resp: 16  Temp: (!) 97.1 F (36.2 C)  TempSrc: Temporal  SpO2: 97%  Weight: 203 lb (92.1 kg)  Height: 5' 4.5" (1.638 m)    Body mass index is 34.31 kg/m.  Physical Exam  Constitutional: Patient appears well-developed and well-nourished. Obese  No distress.  HEENT: head atraumatic, normocephalic, pupils equal and reactive to light, neck supple Cardiovascular: Normal rate, regular rhythm and normal heart sounds.  No murmur heard. No BLE edema. Pulmonary/Chest: Effort normal and breath sounds normal. No respiratory distress. Abdominal: Soft.  There is no tenderness. Psychiatric: Patient has a normal mood and affect. behavior is normal. Judgment and thought content normal.  PHQ2/9: Depression screen [facility] 2/9 10/24/2019 01/23/2019 03/17/2018 02/14/2018 12/14/2017  Decreased Interest 0 0 0 2 0  Down, Depressed, Hopeless 1 0 0 1 0  PHQ - 2 Score 1 0 0 3 0  Altered sleeping 0 0 0 1 0  Tired, decreased energy 0 0 0 3 0  Change in appetite 0 0 0 0 0  Feeling bad or failure about yourself  0 0 0 1 0  Trouble concentrating 0 0 0 1 0  Moving slowly or fidgety/restless 0 0 0 0 0  Suicidal thoughts 0 0 0 0 0  PHQ-9 Score 1 0 0 9 0  Difficult doing  work/chores Not difficult at all Not difficult at all Not difficult at all Very difficult Not difficult at all    phq 9 is positive - just found out her oldest brother has stage 4 kidney cancer and may only have 4-6 months to live    Fall Risk: Fall Risk  10/24/2019 01/23/2019 03/17/2018 02/14/2018 12/14/2017  Falls in the past year? 0 0 0 No No  Number falls in past yr: 0 0 - - -  Injury with Fall? 0 0 - - -    Functional Status Survey: Is the patient  deaf or have difficulty hearing?: No Does the patient have difficulty seeing, even when wearing glasses/contacts?: No Does the patient have difficulty concentrating, remembering, or making decisions?: No Does the patient have difficulty walking or climbing stairs?: No Does the patient have difficulty dressing or bathing?: No Does the patient have difficulty doing errands alone such as visiting a doctor's office or shopping?: No    Assessment & Plan  1. Insulin resistance  Recheck labs next visit   2. Dyslipidemia  Discussed life style modification   3. Vitamin D deficiency  Needs to take supplements more consistently   4. Dysmetabolic syndrome   5. B12 deficiency  Needs to take supplements more often   6. H/O multiple allergies  - montelukast (SINGULAIR) 10 MG tablet; Take 1 tablet (10 mg total) by mouth at bedtime.  Dispense: 90 tablet; Refill: 3  7. Seasonal allergic rhinitis, unspecified trigger  - levocetirizine (XYZAL) 5 MG tablet; Take 1 tablet (5 mg total) by mouth every evening.  Dispense: 90 tablet; Refill: 3  8. History of nephrolithiasis   9. Obesity (BMI 30.0-34.9)  Discussed with the patient the risk posed by an increased BMI. Discussed importance of portion control, calorie counting and at least 150 minutes of physical activity weekly. Avoid sweet beverages and drink more water. Eat at least 6 servings of fruit and vegetables daily   10. Stress  Worried about two brothers being sick, oldest  diagnosed with cancer,another brother has DM and below knee amputation . Discussed counseling, support

## 2019-11-13 ENCOUNTER — Ambulatory Visit
Admission: RE | Admit: 2019-11-13 | Discharge: 2019-11-13 | Disposition: A | Discharge status: Home / Self Care | Payer: BC Managed Care – PPO | Source: Ambulatory Visit | Attending: Family Medicine | Admitting: Family Medicine

## 2019-11-13 DIAGNOSIS — Z1231 Encounter for screening mammogram for malignant neoplasm of breast: Secondary | ICD-10-CM | POA: Diagnosis not present

## 2020-01-29 ENCOUNTER — Ambulatory Visit (INDEPENDENT_AMBULATORY_CARE_PROVIDER_SITE_OTHER): Payer: BC Managed Care – PPO | Admitting: Family Medicine

## 2020-01-29 ENCOUNTER — Other Ambulatory Visit: Payer: Self-pay

## 2020-01-29 ENCOUNTER — Encounter: Payer: Self-pay | Admitting: Family Medicine

## 2020-01-29 VITALS — BP 112/78 | HR 69 | Temp 98.2°F | Resp 16 | Ht 65.0 in | Wt 201.5 lb

## 2020-01-29 DIAGNOSIS — E538 Deficiency of other specified B group vitamins: Secondary | ICD-10-CM | POA: Diagnosis not present

## 2020-01-29 DIAGNOSIS — Z0000 Encounter for general adult medical examination without abnormal findings: Secondary | ICD-10-CM | POA: Diagnosis not present

## 2020-01-29 DIAGNOSIS — J302 Other seasonal allergic rhinitis: Secondary | ICD-10-CM

## 2020-01-29 DIAGNOSIS — N6459 Other signs and symptoms in breast: Secondary | ICD-10-CM

## 2020-01-29 DIAGNOSIS — E559 Vitamin D deficiency, unspecified: Secondary | ICD-10-CM

## 2020-01-29 DIAGNOSIS — E8881 Metabolic syndrome: Secondary | ICD-10-CM | POA: Diagnosis not present

## 2020-01-29 DIAGNOSIS — R21 Rash and other nonspecific skin eruption: Secondary | ICD-10-CM

## 2020-01-29 DIAGNOSIS — Z23 Encounter for immunization: Secondary | ICD-10-CM | POA: Diagnosis not present

## 2020-01-29 DIAGNOSIS — R252 Cramp and spasm: Secondary | ICD-10-CM

## 2020-01-29 DIAGNOSIS — E785 Hyperlipidemia, unspecified: Secondary | ICD-10-CM

## 2020-01-29 MED ORDER — DESONIDE 0.05 % EX CREA
TOPICAL_CREAM | Freq: Two times a day (BID) | CUTANEOUS | 0 refills | Status: AC
Start: 2020-01-29 — End: ?

## 2020-01-29 NOTE — Patient Instructions (Addendum)
Nuun tablets  Preventive Care 37-53 Years Old, Female Preventive care refers to visits with your health care provider and lifestyle choices that can promote health and wellness. This includes:  A yearly physical exam. This may also be called an annual well check.  Regular dental visits and eye exams.  Immunizations.  Screening for certain conditions.  Healthy lifestyle choices, such as eating a healthy diet, getting regular exercise, not using drugs or products that contain nicotine and tobacco, and limiting alcohol use. What can I expect for my preventive care visit? Physical exam Your health care provider will check your:  Height and weight. This may be used to calculate body mass index (BMI), which tells if you are at a healthy weight.  Heart rate and blood pressure.  Skin for abnormal spots. Counseling Your health care provider may ask you questions about your:  Alcohol, tobacco, and drug use.  Emotional well-being.  Home and relationship well-being.  Sexual activity.  Eating habits.  Work and work Statistician.  Method of birth control.  Menstrual cycle.  Pregnancy history. What immunizations do I need?  Influenza (flu) vaccine  This is recommended every year. Tetanus, diphtheria, and pertussis (Tdap) vaccine  You may need a Td booster every 10 years. Varicella (chickenpox) vaccine  You may need this if you have not been vaccinated. Zoster (shingles) vaccine  You may need this after age 106. Measles, mumps, and rubella (MMR) vaccine  You may need at least one dose of MMR if you were born in 1957 or later. You may also need a second dose. Pneumococcal conjugate (PCV13) vaccine  You may need this if you have certain conditions and were not previously vaccinated. Pneumococcal polysaccharide (PPSV23) vaccine  You may need one or two doses if you smoke cigarettes or if you have certain conditions. Meningococcal conjugate (MenACWY) vaccine  You may  need this if you have certain conditions. Hepatitis A vaccine  You may need this if you have certain conditions or if you travel or work in places where you may be exposed to hepatitis A. Hepatitis B vaccine  You may need this if you have certain conditions or if you travel or work in places where you may be exposed to hepatitis B. Haemophilus influenzae type b (Hib) vaccine  You may need this if you have certain conditions. Human papillomavirus (HPV) vaccine  If recommended by your health care provider, you may need three doses over 6 months. You may receive vaccines as individual doses or as more than one vaccine together in one shot (combination vaccines). Talk with your health care provider about the risks and benefits of combination vaccines. What tests do I need? Blood tests  Lipid and cholesterol levels. These may be checked every 5 years, or more frequently if you are over 29 years old.  Hepatitis C test.  Hepatitis B test. Screening  Lung cancer screening. You may have this screening every year starting at age 3 if you have a 30-pack-year history of smoking and currently smoke or have quit within the past 15 years.  Colorectal cancer screening. All adults should have this screening starting at age 32 and continuing until age 44. Your health care provider may recommend screening at age 2 if you are at increased risk. You will have tests every 1-10 years, depending on your results and the type of screening test.  Diabetes screening. This is done by checking your blood sugar (glucose) after you have not eaten for a while (fasting). You  may have this done every 1-3 years.  Mammogram. This may be done every 1-2 years. Talk with your health care provider about when you should start having regular mammograms. This may depend on whether you have a family history of breast cancer.  BRCA-related cancer screening. This may be done if you have a family history of breast, ovarian,  tubal, or peritoneal cancers.  Pelvic exam and Pap test. This may be done every 3 years starting at age 49. Starting at age 32, this may be done every 5 years if you have a Pap test in combination with an HPV test. Other tests  Sexually transmitted disease (STD) testing.  Bone density scan. This is done to screen for osteoporosis. You may have this scan if you are at high risk for osteoporosis. Follow these instructions at home: Eating and drinking  Eat a diet that includes fresh fruits and vegetables, whole grains, lean protein, and low-fat dairy.  Take vitamin and mineral supplements as recommended by your health care provider.  Do not drink alcohol if: ? Your health care provider tells you not to drink. ? You are pregnant, may be pregnant, or are planning to become pregnant.  If you drink alcohol: ? Limit how much you have to 0-1 drink a day. ? Be aware of how much alcohol is in your drink. In the [place] one drink equals one 12 oz bottle of beer (355 mL), one 5 oz glass of wine (148 mL), or one 1 oz glass of hard liquor (44 mL). Lifestyle  Take daily care of your teeth and gums.  Stay active. Exercise for at least 30 minutes on 5 or more days each week.  Do not use any products that contain nicotine or tobacco, such as cigarettes, e-cigarettes, and chewing tobacco. If you need help quitting, ask your health care provider.  If you are sexually active, practice safe sex. Use a condom or other form of birth control (contraception) in order to prevent pregnancy and STIs (sexually transmitted infections).  If told by your health care provider, take low-dose aspirin daily starting at age 44. What's next?  Visit your health care provider once a year for a well check visit.  Ask your health care provider how often you should have your eyes and teeth checked.  Stay up to date on all vaccines. This information is not intended to replace advice given to you by your health care  provider. Make sure you discuss any questions you have with your health care provider. Document Revised: 12/30/2017 Document Reviewed: 12/30/2017 Elsevier Patient Education  2020 [employer].

## 2020-01-29 NOTE — Progress Notes (Signed)
Name: Tamara Nelson   MRN: [id number]    DOB: 03/20/1966   Date:01/29/2020       Progress Note  Subjective  Chief Complaint  CPE  HPI  Patient presents for annual CPE.  Rash: intermittent, usually little red bump, however over the past two weeks has noticed a rash on right outer breast, it gets itchy when sweaty, no drainage, no nipple discharge or lumps. Mammogram normal July 2021   Cramps right leg: usually on the days she is more active, like mowing her yard, discussed adding electrolytes. Discussed stretching and more water.   Diet: eating a balanced diet, protein bowls for breakfast, salads or left overs for lunch and sometimes sandwiches, dinner at home - she cooks at home   Exercise: needs to increase to 150 minutes per week     Office Visit from 01/29/2020 in [facility]  AUDIT-C Score 0     Depression: Phq 9 is  negative Depression screen [facility] 2/9 01/29/2020 10/24/2019 01/23/2019 03/17/2018 02/14/2018  Decreased Interest 0 0 0 0 2  Down, Depressed, Hopeless 0 1 0 0 1  PHQ - 2 Score 0 1 0 0 3  Altered sleeping 0 0 0 0 1  Tired, decreased energy 0 0 0 0 3  Change in appetite 0 0 0 0 0  Feeling bad or failure about yourself  0 0 0 0 1  Trouble concentrating 0 0 0 0 1  Moving slowly or fidgety/restless 0 0 0 0 0  Suicidal thoughts 0 0 0 0 0  PHQ-9 Score 0 1 0 0 9  Difficult doing work/chores - Not difficult at all Not difficult at all Not difficult at all Very difficult   Hypertension: BP Readings from Last 3 Encounters:  01/29/20 112/78  10/24/19 110/80  01/23/19 106/70   Obesity: Wt Readings from Last 3 Encounters:  01/29/20 201 lb 8 oz (91.4 kg)  10/24/19 203 lb (92.1 kg)  01/23/19 201 lb (91.2 kg)   BMI Readings from Last 3 Encounters:  01/29/20 33.53 kg/m  10/24/19 34.31 kg/m  01/23/19 33.97 kg/m     Vaccines:  HPV: up to at age 11 , ask insurance if age between 31-45  Shingrix: 29-64 yo and ask insurance if covered when  patient above 68 yo Pneumonia:  educated and discussed with patient. Flu:  educated and discussed with patient.  Hep C Screening: 01/23/2019  STD testing and prevention (HIV/chl/gon/syphilis): not interested  Intimate partner violence: negative screen  Sexual History : not sexually  Menstrual History/LMP/Abnormal Bleeding:  Discussed post-menopausal bleeding and need to follow up Incontinence Symptoms:  No problems  Breast cancer:  - Last Mammogram: 11/13/2019 - BRCA gene screening: N/A  Osteoporosis: Discussed high calcium and vitamin D supplementation, weight bearing exercises  Cervical cancer screening: 12/14/2017   Skin cancer: Discussed monitoring for atypical lesions  Colorectal cancer: 06/06/2015  Lung cancer:  N/A Low Dose CT Chest recommended if Age 58-80 years, 20 pack-year currently smoking OR have quit w/in 15years. Patient does not qualify.   ECG: 09/03/20215  Advanced Care Planning: A voluntary discussion about advance care planning including the explanation and discussion of advance directives.  Discussed health care proxy and Living will, and the patient was able to identify a health care proxy as Jonna Coup - friend .  Patient does not have a living will at present time.   Lipids: Lab Results  Component Value Date   CHOL 177 01/23/2019   CHOL 174  12/14/2017   CHOL 160 10/26/2016   Lab Results  Component Value Date   HDL 46 (L) 01/23/2019   HDL 44 (L) 12/14/2017   HDL 38 (L) 10/26/2016   Lab Results  Component Value Date   LDLCALC 116 (H) 01/23/2019   LDLCALC 114 (H) 12/14/2017   LDLCALC 107 (H) 10/26/2016   Lab Results  Component Value Date   TRIG 54 01/23/2019   TRIG 72 12/14/2017   TRIG 75 10/26/2016   Lab Results  Component Value Date   CHOLHDL 3.8 01/23/2019   CHOLHDL 4.0 12/14/2017   CHOLHDL 4.2 10/26/2016   No results found for: LDLDIRECT  Glucose: Glucose, Bld  Date Value Ref Range Status  01/23/2019 91 65 - 99 mg/dL Final     Comment:    .            Fasting reference interval .   03/05/2018 109 (H) 70 - 99 mg/dL Final  12/14/2017 93 65 - 99 mg/dL Final    Comment:    .            Fasting reference interval .     Patient Active Problem List   Diagnosis Date Noted  . Diverticulosis 03/17/2018  . Nephrolithiasis 03/17/2018  . B12 deficiency 10/23/2015  . Dyslipidemia 10/21/2015  . Bee sting allergy 10/17/2014  . Abnormal serum level of alkaline phosphatase 10/17/2014  . Menopause 10/17/2014  . Irritant contact dermatitis 10/17/2014  . Dysmetabolic syndrome 91/50/5697  . Obesity (BMI 30-39.9) 10/17/2014  . Vitamin D deficiency 10/17/2014  . Allergic rhinitis, seasonal 10/17/2014    History reviewed. No pertinent surgical history.  Family History  Problem Relation Age of Onset  . Cancer Mother        colon  . Cancer Father        Liver and Lung  . Stroke Father   . Osteoarthritis Sister   . Hypertension Brother   . Diabetes Brother   . Kidney cancer Brother   . Diabetes Brother   . Vascular Disease Brother   . Breast cancer Neg Hx     Social History   Socioeconomic History  . Marital status: Single    Spouse name: Not on file  . Number of children: 0  . Years of education: Not on file  . Highest education level: High school graduate  Occupational History  . Occupation: Training and development officer: [employer]  Tobacco Use  . Smoking status: Never Smoker  . Smokeless tobacco: Never Used  Vaping Use  . Vaping Use: Never used  Substance and Sexual Activity  . Alcohol use: No    Alcohol/week: 0.0 standard drinks  . Drug use: No  . Sexual activity: Never  Other Topics Concern  . Not on file  Social History Narrative   Lives alone, works at [employer] during the day and for [employer] at night, 5 nights weekly    Social Determinants of Health   Financial Resource Strain: [place]   . Difficulty of Paying Living Expenses: Not hard at all  Food  Insecurity: No Food Insecurity  . Worried About Charity fundraiser in the Last Year: Never true  . Ran Out of Food in the Last Year: Never true  Transportation Needs: No Transportation Needs  . Lack of Transportation (Medical): No  . Lack of Transportation (Non-Medical): No  Physical Activity: Insufficiently Active  . Days of Exercise per Week: 1 day  . Minutes of  Exercise per Session: 40 min  Stress: No Stress Concern Present  . Feeling of Stress : Only a little  Social Connections: Socially Isolated  . Frequency of Communication with Friends and Family: More than three times a week  . Frequency of Social Gatherings with Friends and Family: Not on file  . Attends Religious Services: Never  . Active Member of Clubs or Organizations: No  . Attends Archivist Meetings: Never  . Marital Status: Never married  Intimate Partner Violence: Not At Risk  . Fear of Current or Ex-Partner: No  . Emotionally Abused: No  . Physically Abused: No  . Sexually Abused: No     Current Outpatient Medications:  .  ammonium lactate (LAC-HYDRIN) 12 % cream, Apply topically as needed for dry skin., Disp: 385 g, Rfl: 0 .  Cholecalciferol (VITAMIN D) 2000 UNITS CAPS, Take 1 capsule by mouth as needed., Disp: , Rfl:  .  Cyanocobalamin (B-12) 1000 MCG SUBL, Place 1 tablet under the tongue daily., Disp: 30 each, Rfl: 0 .  EPINEPHrine (EPIPEN 2-PAK) 0.3 mg/0.3 mL IJ SOAJ injection, Inject 0.3 mLs (0.3 mg total) into the muscle as needed for anaphylaxis., Disp: 1 each, Rfl: 1 .  fluticasone (FLONASE) 50 MCG/ACT nasal spray, Place 2 sprays into both nostrils daily., Disp: 48 g, Rfl: 1 .  montelukast (SINGULAIR) 10 MG tablet, Take 1 tablet (10 mg total) by mouth at bedtime., Disp: 90 tablet, Rfl: 3 .  triamcinolone cream (KENALOG) 0.1 %, Apply topically 2 (two) times daily., Disp: 80 g, Rfl: 1 .  levocetirizine (XYZAL) 5 MG tablet, Take 1 tablet (5 mg total) by mouth every evening. (Patient not taking:  Reported on 01/29/2020), Disp: 90 tablet, Rfl: 3 .  Olopatadine HCl 0.2 % SOLN, INSTILL 1 DROP INTO BOTH EYES EVERY MORNING (Patient not taking: Reported on 01/29/2020), Disp: 2.5 mL, Rfl: 3  Allergies  Allergen Reactions  . Bee Venom      ROS  Constitutional: Negative for fever or weight change.  Respiratory: Negative for cough and shortness of breath.   Cardiovascular: Negative for chest pain or palpitations.  Gastrointestinal: Negative for abdominal pain, no bowel changes.  Musculoskeletal: Negative for gait problem or joint swelling.  Skin: positive  for rash.  Neurological: Negative for dizziness or headache.  No other specific complaints in a complete review of systems (except as listed in HPI above).  Objective  Vitals:   01/29/20 1309  BP: 112/78  Pulse: 69  Resp: 16  Temp: 98.2 F (36.8 C)  TempSrc: Oral  SpO2: 99%  Weight: 201 lb 8 oz (91.4 kg)  Height: _0  (1.651 m)    Body mass index is 33.53 kg/m.  Physical Exam  Constitutional: Patient appears well-developed and well-nourished, obese . No distress.  HENT: Head: Normocephalic and atraumatic. Ears: B TMs ok, no erythema or effusion; Nose:not done Mouth/Throat: not done  Eyes: Conjunctivae and EOM are normal. Pupils are equal, round, and reactive to light. No scleral icterus.  Neck: Normal range of motion. Neck supple. No JVD present. No thyromegaly present.  Cardiovascular: Normal rate, regular rhythm and normal heart sounds.  No murmur heard. No BLE edema. Pulmonary/Chest: Effort normal and breath sounds normal. No respiratory distress. Abdominal: Soft. Bowel sounds are normal, no distension. There is no tenderness. no masses Breast: no lumps or masses, no nipple discharge , erythematous rash, blanches with pressure on right outer breast. FEMALE GENITALIA:  External genitalia normal External urethra normal Pelvic not done  RECTAL: not done Musculoskeletal: Normal range of motion, no joint effusions.  No gross deformities Neurological: he is alert and oriented to person, place, and time. No cranial nerve deficit. Coordination, balance, strength, speech and gait are normal.  Skin: Skin is warm and dry. No rash noted. No erythema.  Psychiatric: Patient has a normal mood and affect. behavior is normal. Judgment and thought content normal.  Fall Risk: Fall Risk  01/29/2020 10/24/2019 01/23/2019 03/17/2018 02/14/2018  Falls in the past year? 0 0 0 0 No  Number falls in past yr: 0 0 0 - -  Injury with Fall? 0 0 0 - -    Functional Status Survey: Is the patient deaf or have difficulty hearing?: No Does the patient have difficulty seeing, even when wearing glasses/contacts?: No Does the patient have difficulty concentrating, remembering, or making decisions?: No Does the patient have difficulty walking or climbing stairs?: No Does the patient have difficulty dressing or bathing?: No Does the patient have difficulty doing errands alone such as visiting a doctor's office or shopping?: No  Assessment & Plan   1. Need for immunization against influenza  - Flu Vaccine QUAD 6+ mos PF IM (Fluarix Quad PF)  2. B12 deficiency  - CBC with Differential/Platelet - Vitamin B12  3. Well adult exam  - Lipid panel - COMPLETE METABOLIC PANEL WITH GFR - CBC with Differential/Platelet - Hemoglobin A1c - VITAMIN D 25 Hydroxy (Vit-D Deficiency, Fractures) - Vitamin B12  4. Vitamin D deficiency  - VITAMIN D 25 Hydroxy (Vit-D Deficiency, Fractures)  5. Dyslipidemia  - Lipid panel  6. Dysmetabolic syndrome  - Hemoglobin A1c  7. Insulin resistance  - Hemoglobin A1c  8. Seasonal allergic rhinitis, unspecified trigger   9. Leg cramps  Take Nuum tabs day she takes care of her yard  10. Rash  We will desowen on breast, also advised lotion and if any  Lumps diagnostic mammogram  May use topical medication on rash on abdomen but avoid applying it on breast rash since different and needs  further evaluation   11. Abnormal breast finding  - Ambulatory referral to General Surgery   -USPSTF grade A and B recommendations reviewed with patient; age-appropriate recommendations, preventive care, screening tests, etc discussed and encouraged; healthy living encouraged; see AVS for patient education given to patient -Discussed importance of 150 minutes of physical activity weekly, eat two servings of fish weekly, eat one serving of tree nuts ( cashews, pistachios, pecans, almonds.Marland Kitchen) every other day, eat 6 servings of fruit/vegetables daily and drink plenty of water and avoid sweet beverages.

## 2020-01-30 LAB — CBC WITH DIFFERENTIAL/PLATELET
Absolute Monocytes: 445 cells/uL (ref 200–950)
Basophils Absolute: 34 cells/uL (ref 0–200)
Basophils Relative: 0.4 %
Eosinophils Absolute: 76 cells/uL (ref 15–500)
Eosinophils Relative: 0.9 %
HCT: 40.9 % (ref 35.0–45.0)
Hemoglobin: 13.9 g/dL (ref 11.7–15.5)
Lymphs Abs: 2839 cells/uL (ref 850–3900)
MCH: 29.8 pg (ref 27.0–33.0)
MCHC: 34 g/dL (ref 32.0–36.0)
MCV: 87.6 fL (ref 80.0–100.0)
MPV: 10 fL (ref 7.5–12.5)
Monocytes Relative: 5.3 %
Neutro Abs: 5006 cells/uL (ref 1500–7800)
Neutrophils Relative %: 59.6 %
Platelets: 293 10*3/uL (ref 140–400)
RBC: 4.67 10*6/uL (ref 3.80–5.10)
RDW: 13.5 % (ref 11.0–15.0)
Total Lymphocyte: 33.8 %
WBC: 8.4 10*3/uL (ref 3.8–10.8)

## 2020-01-30 LAB — COMPLETE METABOLIC PANEL WITH GFR
AG Ratio: 1.7 (calc) (ref 1.0–2.5)
ALT: 19 U/L (ref 6–29)
AST: 17 U/L (ref 10–35)
Albumin: 4.3 g/dL (ref 3.6–5.1)
Alkaline phosphatase (APISO): 137 U/L (ref 37–153)
BUN: 10 mg/dL (ref 7–25)
CO2: 26 mmol/L (ref 20–32)
Calcium: 9.5 mg/dL (ref 8.6–10.4)
Chloride: 104 mmol/L (ref 98–110)
Creat: 0.75 mg/dL (ref 0.50–1.05)
GFR, Est African American: 105 mL/min/{1.73_m2} (ref >=60)
GFR, Est Non African American: 91 mL/min/{1.73_m2} (ref >=60)
Globulin: 2.6 g/dL (calc) (ref 1.9–3.7)
Glucose, Bld: 88 mg/dL (ref 65–99)
Potassium: 3.7 mmol/L (ref 3.5–5.3)
Sodium: 139 mmol/L (ref 135–146)
Total Bilirubin: 0.4 mg/dL (ref 0.2–1.2)
Total Protein: 6.9 g/dL (ref 6.1–8.1)

## 2020-01-30 LAB — LIPID PANEL
Cholesterol: 184 mg/dL (ref <200)
HDL: 46 mg/dL — ABNORMAL LOW (ref >=50)
LDL Cholesterol (Calc): 120 mg/dL (calc) — ABNORMAL HIGH
Non-HDL Cholesterol (Calc): 138 mg/dL (calc) — ABNORMAL HIGH (ref <130)
Total CHOL/HDL Ratio: 4 (calc) (ref <5.0)
Triglycerides: 83 mg/dL (ref <150)

## 2020-01-30 LAB — HEMOGLOBIN A1C
Hgb A1c MFr Bld: 5.8 % of total Hgb — ABNORMAL HIGH (ref <5.7)
Mean Plasma Glucose: 120 (calc)
eAG (mmol/L): 6.6 (calc)

## 2020-01-30 LAB — VITAMIN B12: Vitamin B-12: 363 pg/mL (ref 200–1100)

## 2020-01-30 LAB — VITAMIN D 25 HYDROXY (VIT D DEFICIENCY, FRACTURES): Vit D, 25-Hydroxy: 37 ng/mL (ref 30–100)

## 2020-01-31 ENCOUNTER — Telehealth: Payer: Self-pay | Admitting: *Deleted

## 2020-01-31 NOTE — Telephone Encounter (Signed)
lvm to let the patient know that message has been sent to my chart about her labs and if there is anything else to give [place] a call.

## 2020-01-31 NOTE — Telephone Encounter (Signed)
Patient is calling for lab results. Note in MyChart- relayed to patient:  Hi Tamara Nelson,   Lipid panel is stable, good cholesterol can improve by exercising more, adding more fish and vegetables to your diet Sugar, kidney and liver function tests are within normal limits Normal CBC - no anemia A1C in the pre-diabetes range Normal Vitamin D and B12 levels, continue supplementation  Take care,   Dr. Carlynn Purl  Patient advised on how to access her MyChart-.

## 2020-02-22 DIAGNOSIS — R21 Rash and other nonspecific skin eruption: Secondary | ICD-10-CM | POA: Diagnosis not present

## 2020-06-06 ENCOUNTER — Ambulatory Visit: Payer: Self-pay | Admitting: *Deleted

## 2020-06-06 NOTE — Telephone Encounter (Signed)
Pt called stating her BP was elevated in the dentist's office 06/05/20 at 1500her BP was 189/128; at 1600 it was 158/109 at 1the dentist office; the pt says it was 189/111 on her right wrist at 1235 on 06/06/20; her vision has been blurry the past couple of weeks and she lightheaded; she is experiencing  lightheaded now also; the pt  is also says she is "thirsty and dry"; recommendations made per nurse triage protocol; she verbalized understanding but does not want to go the the ED; the pt says she would like to be seen in the office; the pt sees Dr Carlynn Purl, [facility]; attempted to reach the [facility] in office but no answer; the pt can be contacted at [phone]; will route to office for notification and final disposition.  Reason for Disposition . [1] Systolic BP  >= 160 OR Diastolic >= 100 AND [2] cardiac or neurologic symptoms (e.g., chest pain, difficulty breathing, unsteady gait, blurred vision)  Answer Assessment - Initial Assessment Questions 1. BLOOD PRESSURE: "What is the blood pressure?" "Did you take at least two measurements 5 minutes apart?"     189/111 at 1235 on 06/06/20;  In dentist office on 06/05/20 189/128 at 1500 158/109 at 1600 2. ONSET: "When did you take your blood pressure?"       189/111 at 1235 06/06/20;  06/05/20 189/128 at 1500 158/109 at 1600 3. HOW: "How did you obtain the blood pressure?" (e.g., visiting nurse, automatic home BP monitor)     06/06/20 home cuff on left wrist; 06/05/20 at dentist office 4. HISTORY: "Do you have a history of high blood pressure?"     no 5. MEDICATIONS: "Are you taking any medications for blood pressure?" "Have you missed any doses recently?"     no 6. OTHER SYMPTOMS: "Do you have any symptoms?" (e.g., headache, chest pain, blurred vision, difficulty breathing, weakness)     Blurred vision for a couple of weeks and lightheaded 7. PREGNANCY: "Is there any chance you are pregnant?" "When was your last menstrual period?"     no  Protocols used:  BLOOD PRESSURE - HIGH-A-AH

## 2020-06-06 NOTE — Progress Notes (Signed)
Name: Tamara Nelson   MRN: [id number]    DOB: 1966-04-04   Date:06/07/2020       Progress Note  Subjective  Chief Complaint  Elevated Blood Pressure  HPI  HTN: she went to the dentist two days ago and bp was 158/109. She got a bp monitor and it has been in the 150's range. She states she has intermittent orthostatic changes when she stands up over the past week, some headache this morning ( but she states she came in fasting) . She also has noticed some foot cramping intermittently. She denies increase in stress, chest pain, palpitation. Denies symptoms of COVID-19.   She is still working two jobs, cares for her brother and now also helping her sister that had knee surgery. Explained stress plus drinking caffeine can raise bp.   We will check labs and start therapy, discussed possible side effects  Patient Active Problem List   Diagnosis Date Noted  . Diverticulosis 03/17/2018  . Nephrolithiasis 03/17/2018  . B12 deficiency 10/23/2015  . Dyslipidemia 10/21/2015  . Bee sting allergy 10/17/2014  . Abnormal serum level of alkaline phosphatase 10/17/2014  . Menopause 10/17/2014  . Irritant contact dermatitis 10/17/2014  . Dysmetabolic syndrome 10/17/2014  . Obesity (BMI 30-39.9) 10/17/2014  . Vitamin D deficiency 10/17/2014  . Allergic rhinitis, seasonal 10/17/2014    No past surgical history on file.  Family History  Problem Relation Age of Onset  . Cancer Mother        colon  . Cancer Father        Liver and Lung  . Stroke Father   . Osteoarthritis Sister   . Hypertension Brother   . Diabetes Brother   . Kidney cancer Brother   . Diabetes Brother   . Vascular Disease Brother   . Breast cancer Neg Hx     Social History   Tobacco Use  . Smoking status: Never Smoker  . Smokeless tobacco: Never Used  Substance Use Topics  . Alcohol use: No    Alcohol/week: 0.0 standard drinks     Current Outpatient Medications:  .  ammonium lactate (LAC-HYDRIN) 12 % cream,  Apply topically as needed for dry skin., Disp: 385 g, Rfl: 0 .  Cholecalciferol (VITAMIN D) 2000 UNITS CAPS, Take 1 capsule by mouth as needed., Disp: , Rfl:  .  Cyanocobalamin (B-12) 1000 MCG SUBL, Place 1 tablet under the tongue daily., Disp: 30 each, Rfl: 0 .  desonide (DESOWEN) 0.05 % cream, Apply topically 2 (two) times daily., Disp: 30 g, Rfl: 0 .  EPINEPHrine (EPIPEN 2-PAK) 0.3 mg/0.3 mL IJ SOAJ injection, Inject 0.3 mLs (0.3 mg total) into the muscle as needed for anaphylaxis., Disp: 1 each, Rfl: 1 .  fluticasone (FLONASE) 50 MCG/ACT nasal spray, Place 2 sprays into both nostrils daily., Disp: 48 g, Rfl: 1 .  montelukast (SINGULAIR) 10 MG tablet, Take 1 tablet (10 mg total) by mouth at bedtime., Disp: 90 tablet, Rfl: 3 .  triamcinolone cream (KENALOG) 0.1 %, Apply topically 2 (two) times daily., Disp: 80 g, Rfl: 1 .  levocetirizine (XYZAL) 5 MG tablet, Take 1 tablet (5 mg total) by mouth every evening. (Patient not taking: No sig reported), Disp: 90 tablet, Rfl: 3 .  Olopatadine HCl 0.2 % SOLN, INSTILL 1 DROP INTO BOTH EYES EVERY MORNING (Patient not taking: No sig reported), Disp: 2.5 mL, Rfl: 3  Allergies  Allergen Reactions  . Bee Venom   . Mixed Vespid Venom  Other reaction(s): Unknown    I personally reviewed active problem list, medication list, allergies, family history, social history, health maintenance with the patient/caregiver today.   ROS   Constitutional: Negative for fever or weight change.  Respiratory: Negative for cough and shortness of breath.   Cardiovascular: Negative for chest pain or palpitations.  Gastrointestinal: Negative for abdominal pain, no bowel changes.  Musculoskeletal: Negative for gait problem or joint swelling.  Skin: Negative for rash.  Neurological: positive for dizziness or headache.  No other specific complaints in a complete review of systems (except as listed in HPI above).   Objective  Vitals:   06/07/20 1147 06/07/20 1159   BP: (!) 150/92 (!) 156/94  Pulse: 89   Resp: 16   Temp: 98.3 F (36.8 C)   TempSrc: Oral   SpO2: 95%   Weight: 208 lb 12.8 oz (94.7 kg)   Height: 5\' 5"  (1.651 m)     Body mass index is 34.75 kg/m.  Physical Exam  Constitutional: Patient appears well-developed and well-nourished. Obese  No distress.  HEENT: head atraumatic, normocephalic, pupils equal and reactive to light, e neck supple,  Cardiovascular: Normal rate, regular rhythm and normal heart sounds.  No murmur heard. No BLE edema. Pulmonary/Chest: Effort normal and breath sounds normal. No respiratory distress. Abdominal: Soft.  There is no tenderness. Psychiatric: Patient has a normal mood and affect. behavior is normal. Judgment and thought content normal.  PHQ2/9: Depression screen [facility] 2/9 06/07/2020 01/29/2020 10/24/2019 01/23/2019 03/17/2018  Decreased Interest 0 0 0 0 0  Down, Depressed, Hopeless 0 0 1 0 0  PHQ - 2 Score 0 0 1 0 0  Altered sleeping - 0 0 0 0  Tired, decreased energy - 0 0 0 0  Change in appetite - 0 0 0 0  Feeling bad or failure about yourself  - 0 0 0 0  Trouble concentrating - 0 0 0 0  Moving slowly or fidgety/restless - 0 0 0 0  Suicidal thoughts - 0 0 0 0  PHQ-9 Score - 0 1 0 0  Difficult doing work/chores - - Not difficult at all Not difficult at all Not difficult at all    phq 9 is negative   Fall Risk: Fall Risk  06/07/2020 01/29/2020 10/24/2019 01/23/2019 03/17/2018  Falls in the past year? 0 0 0 0 0  Number falls in past yr: 0 0 0 0 -  Injury with Fall? 0 0 0 0 -     Functional Status Survey: Is the patient deaf or have difficulty hearing?: No Does the patient have difficulty seeing, even when wearing glasses/contacts?: No Does the patient have difficulty concentrating, remembering, or making decisions?: No Does the patient have difficulty walking or climbing stairs?: No Does the patient have difficulty dressing or bathing?: No Does the patient have difficulty doing errands alone  such as visiting a doctor's office or shopping?: No    Assessment & Plan  1. Hypertension, benign  - Microalbumin / creatinine urine ratio - CBC with Differential/Platelet - COMPLETE METABOLIC PANEL WITH GFR - Thyroid Panel With TSH - EKG 12-Lead : SR - 1 st degree AV block - losartan (COZAAR) 50 MG tablet; Take 1 tablet (50 mg total) by mouth daily.  Dispense: 30 tablet; Refill: 0  2. Muscle cramp  - Magnesium  3. Orthostatic dizziness  Advised to try taking bp medication at night and to get up slowly

## 2020-06-06 NOTE — Telephone Encounter (Signed)
Pt has an appt with Dr Carlynn Purl on 06/07/20

## 2020-06-07 ENCOUNTER — Other Ambulatory Visit: Payer: Self-pay

## 2020-06-07 ENCOUNTER — Encounter: Payer: Self-pay | Admitting: Family Medicine

## 2020-06-07 ENCOUNTER — Ambulatory Visit: Payer: BC Managed Care – PPO | Admitting: Family Medicine

## 2020-06-07 ENCOUNTER — Ambulatory Visit: Payer: Self-pay | Admitting: *Deleted

## 2020-06-07 VITALS — BP 156/94 | HR 89 | Temp 98.3°F | Resp 16 | Ht 65.0 in | Wt 208.8 lb

## 2020-06-07 DIAGNOSIS — R252 Cramp and spasm: Secondary | ICD-10-CM

## 2020-06-07 DIAGNOSIS — I10 Essential (primary) hypertension: Secondary | ICD-10-CM | POA: Diagnosis not present

## 2020-06-07 DIAGNOSIS — R42 Dizziness and giddiness: Secondary | ICD-10-CM | POA: Diagnosis not present

## 2020-06-07 DIAGNOSIS — Z1211 Encounter for screening for malignant neoplasm of colon: Secondary | ICD-10-CM

## 2020-06-07 LAB — CBC WITH DIFFERENTIAL/PLATELET
Hemoglobin: 14 g/dL (ref 11.7–15.5)
MPV: 9.9 fL (ref 7.5–12.5)

## 2020-06-07 MED ORDER — LOSARTAN POTASSIUM 50 MG PO TABS: 50.0000 mg | ORAL_TABLET | Freq: Every day | ORAL | 0 refills | Status: DC

## 2020-06-07 NOTE — Telephone Encounter (Signed)
Patient calling to ask if she could take Aspirin for a headache before picking up prescription for Losartan that was written today. Patient states that she doe not know when the prescription will be ready for pick up. Patient advised that aspirin can be taken for headache if she did not have any contraindications. Patient voiced concerns about Aspirin lowering BP as well as the the prescribed BP medication. Explained to patient that Advil was not used to lower BP and that it can be used as a pain reliever. Patient advised understanding. Patient also advised that she could ask additional questions regarding new prescription when she picked up the medication at the pharmacy. Understanding verbalized.   Reason for Disposition . Caller has medicine question only, adult not sick, AND triager answers question  Answer Assessment - Initial Assessment Questions 1. NAME of MEDICATION: "What medicine are you calling about?"     Aspirin 2. QUESTION: "What is your question?" (e.g., medication refill, side effect)     Can aspirin be taken prior to taking Losartan 3. PRESCRIBING HCP: "Who prescribed it?" Reason: if prescribed by specialist, call should be referred to that group.     OTC medication 4. SYMPTOMS: "Do you have any symptoms?"     headache 5. SEVERITY: If symptoms are present, ask "Are they mild, moderate or severe?"     Not assessed 6. PREGNANCY:  "Is there any chance that you are pregnant?" "When was your last menstrual period?"     Not assessed  Protocols used: MEDICATION QUESTION CALL-A-AH

## 2020-06-08 LAB — COMPLETE METABOLIC PANEL WITH GFR
AG Ratio: 1.4 (calc) (ref 1.0–2.5)
ALT: 19 U/L (ref 6–29)
AST: 15 U/L (ref 10–35)
Albumin: 4.2 g/dL (ref 3.6–5.1)
Alkaline phosphatase (APISO): 155 U/L — ABNORMAL HIGH (ref 37–153)
BUN: 13 mg/dL (ref 7–25)
CO2: 26 mmol/L (ref 20–32)
Calcium: 9.6 mg/dL (ref 8.6–10.4)
Chloride: 101 mmol/L (ref 98–110)
Creat: 0.63 mg/dL (ref 0.50–1.05)
GFR, Est African American: 119 mL/min/{1.73_m2} (ref >=60)
GFR, Est Non African American: 102 mL/min/{1.73_m2} (ref >=60)
Globulin: 2.9 g/dL (calc) (ref 1.9–3.7)
Glucose, Bld: 93 mg/dL (ref 65–99)
Potassium: 4 mmol/L (ref 3.5–5.3)
Sodium: 137 mmol/L (ref 135–146)
Total Bilirubin: 0.5 mg/dL (ref 0.2–1.2)
Total Protein: 7.1 g/dL (ref 6.1–8.1)

## 2020-06-08 LAB — THYROID PANEL WITH TSH
Free Thyroxine Index: 2 (ref 1.4–3.8)
T3 Uptake: 26 % (ref 22–35)
T4, Total: 7.7 ug/dL (ref 5.1–11.9)
TSH: 2.37 mIU/L

## 2020-06-08 LAB — CBC WITH DIFFERENTIAL/PLATELET
Absolute Monocytes: 510 cells/uL (ref 200–950)
Basophils Absolute: 36 cells/uL (ref 0–200)
Basophils Relative: 0.4 %
Eosinophils Absolute: 64 cells/uL (ref 15–500)
Eosinophils Relative: 0.7 %
HCT: 41.4 % (ref 35.0–45.0)
Lymphs Abs: 2794 cells/uL (ref 850–3900)
MCH: 29.2 pg (ref 27.0–33.0)
MCHC: 33.8 g/dL (ref 32.0–36.0)
MCV: 86.3 fL (ref 80.0–100.0)
Monocytes Relative: 5.6 %
Neutro Abs: 5697 cells/uL (ref 1500–7800)
Neutrophils Relative %: 62.6 %
Platelets: 297 10*3/uL (ref 140–400)
RBC: 4.8 10*6/uL (ref 3.80–5.10)
RDW: 13.2 % (ref 11.0–15.0)
Total Lymphocyte: 30.7 %
WBC: 9.1 10*3/uL (ref 3.8–10.8)

## 2020-06-08 LAB — MAGNESIUM: Magnesium: 2 mg/dL (ref 1.5–2.5)

## 2020-06-10 ENCOUNTER — Telehealth: Payer: Self-pay

## 2020-06-10 NOTE — Telephone Encounter (Signed)
Copied from CRM [id number]. Topic: General - Other >> Jun 10, 2020  8:33 AM Jaquita Rector A wrote: Reason for CRM: Patient called in to inquire if it is ok to take Tylenol along with the BP medication that was prescribed she does not want any adverse affects. Please advise  Ph# [phone]

## 2020-06-11 DIAGNOSIS — R252 Cramp and spasm: Secondary | ICD-10-CM | POA: Diagnosis not present

## 2020-06-11 DIAGNOSIS — I10 Essential (primary) hypertension: Secondary | ICD-10-CM | POA: Diagnosis not present

## 2020-06-13 ENCOUNTER — Telehealth: Payer: Self-pay | Admitting: Family Medicine

## 2020-06-13 LAB — MICROALBUMIN / CREATININE URINE RATIO
Creatinine, Urine: 55 mg/dL (ref 20–275)
Microalb Creat Ratio: 24 mcg/mg creat (ref <30)
Microalb, Ur: 1.3 mg/dL

## 2020-06-13 NOTE — Telephone Encounter (Signed)
Patient is calling to receive her CMP results. Please advise CB- [phone]

## 2020-06-14 ENCOUNTER — Telehealth: Payer: Self-pay

## 2020-06-14 ENCOUNTER — Other Ambulatory Visit: Payer: Self-pay

## 2020-06-14 ENCOUNTER — Ambulatory Visit: Payer: BC Managed Care – PPO

## 2020-06-14 VITALS — BP 128/78

## 2020-06-14 DIAGNOSIS — Z0130 Encounter for examination of blood pressure without abnormal findings: Secondary | ICD-10-CM

## 2020-06-14 NOTE — Telephone Encounter (Signed)
Patient is calling would like  to receive her lab results. Please advise Ph#  [phone]

## 2020-06-14 NOTE — Telephone Encounter (Signed)
Lab results given

## 2020-06-29 ENCOUNTER — Other Ambulatory Visit: Payer: Self-pay | Admitting: Family Medicine

## 2020-06-29 DIAGNOSIS — I10 Essential (primary) hypertension: Secondary | ICD-10-CM

## 2020-06-29 NOTE — Telephone Encounter (Signed)
Requested Prescriptions  Pending Prescriptions Disp Refills  . losartan (COZAAR) 50 MG tablet [Pharmacy Med Name: LOSARTAN POTASSIUM 50 MG TAB] 30 tablet 0    Sig: TAKE 1 TABLET BY MOUTH EVERY DAY     Cardiovascular:  Angiotensin Receptor Blockers Passed - 06/29/2020  1:30 PM      Passed - Cr in normal range and within 180 days    Creat  Date Value Ref Range Status  06/07/2020 0.63 0.50 - 1.05 mg/dL Final    Comment:    For patients >54 years of age, the reference limit for Creatinine is approximately 13% higher for people identified as African-American. .    Creatinine, Urine  Date Value Ref Range Status  06/11/2020 55 20 - 275 mg/dL Final         Passed - K in normal range and within 180 days    Potassium  Date Value Ref Range Status  06/07/2020 4.0 3.5 - 5.3 mmol/L Final         Passed - Patient is not pregnant      Passed - Last BP in normal range    BP Readings from Last 1 Encounters:  06/14/20 128/78         Passed - Valid encounter within last 6 months    Recent Outpatient Visits          3 weeks ago Hypertension, benign   [facility] Alba Cory, MD   5 months ago Abnormal breast finding   [facility] Alba Cory, MD   8 months ago Insulin resistance   [facility] Alba Cory, MD   1 year ago Well adult exam   [facility] Alba Cory, MD   2 years ago Nephrolithiasis   [facility] Doren Custard, [place]      Future Appointments            In 3 weeks Alba Cory, MD [facility]   In 7 months Alba Cory, MD [facility], [facility]

## 2020-07-03 NOTE — Telephone Encounter (Signed)
BP check  

## 2020-07-09 ENCOUNTER — Telehealth: Payer: Self-pay | Admitting: Family Medicine

## 2020-07-09 DIAGNOSIS — J302 Other seasonal allergic rhinitis: Secondary | ICD-10-CM

## 2020-07-09 MED ORDER — LEVOCETIRIZINE DIHYDROCHLORIDE 5 MG PO TABS: 5.0000 mg | ORAL_TABLET | Freq: Every evening | ORAL | 3 refills | Status: DC

## 2020-07-09 NOTE — Telephone Encounter (Signed)
Attempted to contact pt to discuss concerns; left message on voicemail for pt to call office.

## 2020-07-09 NOTE — Addendum Note (Signed)
Addended by: Wilford Corner on: 07/09/2020 04:46 PM   Modules accepted: Orders

## 2020-07-09 NOTE — Telephone Encounter (Signed)
Medication Refill - Medication: Pt has a question about levocetirizine (XYZAL) 5 MG tablet  Wants to know if PCP wants her to continue using medication alongside her BP medication (?) she is completely out  Has the patient contacted their pharmacy? No. (Agent: If no, request that the patient contact the pharmacy for the refill.) (Agent: If yes, when and what did the pharmacy advise?)  Preferred Pharmacy (with phone number or street name):  [id number] - [place], [place] - [address]  [address] [place] [address]  Phone: [phone] Fax: [phone]     Agent: Please be advised that RX refills may take up to 3 business days. We ask that you follow-up with your pharmacy.

## 2020-07-09 NOTE — Telephone Encounter (Signed)
Patient called, left VM to return the call to the office to discuss Xyzal.

## 2020-07-10 NOTE — Telephone Encounter (Signed)
Patient notified

## 2020-07-10 NOTE — Telephone Encounter (Signed)
Left message for patient that xyzal can be taken with BP meds

## 2020-07-22 NOTE — Progress Notes (Signed)
Name: Tamara Nelson   MRN: [id number]    DOB: [birth year]   Date:07/23/2020       Progress Note  Subjective  Chief Complaint  Follow Up  HPI  HTN: she came in Feb 2020 and bp was 156/94, the prior week at the dentis her bp  158/109. She got a bp monitor and it was around  150's range.We gave her losartan 50 mg and after two weeks on the medication she dropped to 25 mg , her bp at home has been controlled in the 115/130's/70-80's. No cheat pain, palpitation, dizziness. Headaches resolved   AR: worse this time of the year, she has itchy eyes, some nasal congestion, had some right nose bleed. She missed work last week because of symptoms.  Insulin resistance: she stopped checking her glucose at home, she denies polyphagia, polydipsia or polyuria. Last A1C was 5.8% % and we will recheck during her wellness exam. She is still drinking [facility] , but without caffeine because her bp was elevated   Nephrolithiasis: had an episodes 03/2018  Denies any dysuria or hematuria , no problems since first episode   Dyslipidemia: reviewed last labs, last LDL was 120, but low risk of heart attacks and strokes we will not start medications at this time   The 10-year ASCVD risk score Denman George DC Montez Hageman., et al., 2013) is: 2%   Values used to calculate the score:     Age: 55 years     Sex: Female     Is Non-Hispanic African American: No     Diabetic: No     Tobacco smoker: No     Systolic Blood Pressure: 110 mmHg     Is BP treated: Yes     HDL Cholesterol: 46 mg/dL     Total Cholesterol: 184 mg/dL   T61 and Vitamin D deficiency: taking otc supplementation but not on a regular basis.   Bee allergies: she needs a refill of Epi Pen at home    Patient Active Problem List   Diagnosis Date Noted  . Diverticulosis 03/17/2018  . Nephrolithiasis 03/17/2018  . B12 deficiency 10/23/2015  . Dyslipidemia 10/21/2015  . Bee sting allergy 10/17/2014  . Abnormal serum level of alkaline phosphatase  10/17/2014  . Menopause 10/17/2014  . Irritant contact dermatitis 10/17/2014  . Dysmetabolic syndrome 10/17/2014  . Obesity (BMI 30-39.9) 10/17/2014  . Vitamin D deficiency 10/17/2014  . Allergic rhinitis, seasonal 10/17/2014    No past surgical history on file.  Family History  Problem Relation Age of Onset  . Cancer Mother        colon  . Cancer Father        Liver and Lung  . Stroke Father   . Osteoarthritis Sister   . Hypertension Brother   . Diabetes Brother   . Kidney cancer Brother   . Diabetes Brother   . Vascular Disease Brother   . Breast cancer Neg Hx     Social History   Tobacco Use  . Smoking status: Never Smoker  . Smokeless tobacco: Never Used  Substance Use Topics  . Alcohol use: No    Alcohol/week: 0.0 standard drinks     Current Outpatient Medications:  .  ammonium lactate (LAC-HYDRIN) 12 % cream, Apply topically as needed for dry skin., Disp: 385 g, Rfl: 0 .  Cholecalciferol (VITAMIN D) 2000 UNITS CAPS, Take 1 capsule by mouth as needed., Disp: , Rfl:  .  Cyanocobalamin (B-12) 1000 MCG SUBL, Place 1 tablet  under the tongue daily., Disp: 30 each, Rfl: 0 .  desonide (DESOWEN) 0.05 % cream, Apply topically 2 (two) times daily., Disp: 30 g, Rfl: 0 .  EPINEPHrine (EPIPEN 2-PAK) 0.3 mg/0.3 mL IJ SOAJ injection, Inject 0.3 mLs (0.3 mg total) into the muscle as needed for anaphylaxis., Disp: 1 each, Rfl: 1 .  fluticasone (FLONASE) 50 MCG/ACT nasal spray, Place 2 sprays into both nostrils daily., Disp: 48 g, Rfl: 1 .  levocetirizine (XYZAL) 5 MG tablet, Take 1 tablet (5 mg total) by mouth every evening., Disp: 90 tablet, Rfl: 3 .  losartan (COZAAR) 50 MG tablet, TAKE 1 TABLET BY MOUTH EVERY DAY, Disp: 30 tablet, Rfl: 0 .  montelukast (SINGULAIR) 10 MG tablet, Take 1 tablet (10 mg total) by mouth at bedtime., Disp: 90 tablet, Rfl: 3 .  triamcinolone cream (KENALOG) 0.1 %, Apply topically 2 (two) times daily., Disp: 80 g, Rfl: 1 .  Olopatadine HCl 0.2 % SOLN,  INSTILL 1 DROP INTO BOTH EYES EVERY MORNING (Patient not taking: No sig reported), Disp: 2.5 mL, Rfl: 3  Allergies  Allergen Reactions  . Bee Venom   . Mixed Vespid Venom     Other reaction(s): Unknown    I personally reviewed active problem list, medication list, allergies, family history, social history, health maintenance with the patient/caregiver today.   ROS  Constitutional: Negative for fever or weight change.  Respiratory: Negative for cough and shortness of breath.   Cardiovascular: Negative for chest pain or palpitations.  Gastrointestinal: Negative for abdominal pain, no bowel changes.  Musculoskeletal: Negative for gait problem or joint swelling.  Skin: Negative for rash.  Neurological: Negative for dizziness or headache.  No other specific complaints in a complete review of systems (except as listed in HPI above).  Objective  Vitals:   07/23/20 1506  BP: 110/68  Pulse: 74  Resp: 16  Temp: 98.2 F (36.8 C)  TempSrc: Oral  SpO2: 97%  Weight: 213 lb (96.6 kg)  Height: 5\' 5"  (1.651 m)    Body mass index is 35.45 kg/m.  Physical Exam  Constitutional: Patient appears well-developed and well-nourished. Obese  No distress.  HEENT: head atraumatic, normocephalic, pupils equal and reactive to light,  neck supple Cardiovascular: Normal rate, regular rhythm and normal heart sounds.  No murmur heard. No BLE edema. Pulmonary/Chest: Effort normal and breath sounds normal. No respiratory distress. Abdominal: Soft.  There is no tenderness. Psychiatric: Patient has a normal mood and affect. behavior is normal. Judgment and thought content normal.   Recent Results (from the past 2160 hour(s))  CBC with Differential/Platelet     Status: None   Collection Time: 06/07/20  2:01 PM  Result Value Ref Range   WBC 9.1 3.8 - 10.8 Thousand/uL   RBC 4.80 3.80 - 5.10 Million/uL   Hemoglobin 14.0 11.7 - 15.5 g/dL   HCT 08/05/20 29.5 - 18.8 %   MCV 86.3 80.0 - 100.0 fL   MCH 29.2  27.0 - 33.0 pg   MCHC 33.8 32.0 - 36.0 g/dL   RDW 41.6 60.6 - 30.1 %   Platelets 297 140 - 400 Thousand/uL   MPV 9.9 7.5 - 12.5 fL   Neutro Abs 5,697 1,500 - 7,800 cells/uL   Lymphs Abs 2,794 850 - 3,900 cells/uL   Absolute Monocytes 510 200 - 950 cells/uL   Eosinophils Absolute 64 15 - 500 cells/uL   Basophils Absolute 36 0 - 200 cells/uL   Neutrophils Relative % 62.6 %   Total Lymphocyte  30.7 %   Monocytes Relative 5.6 %   Eosinophils Relative 0.7 %   Basophils Relative 0.4 %  COMPLETE METABOLIC PANEL WITH GFR     Status: Abnormal   Collection Time: 06/07/20  2:01 PM  Result Value Ref Range   Glucose, Bld 93 65 - 99 mg/dL    Comment: .            Fasting reference interval .    BUN 13 7 - 25 mg/dL   Creat 7.820.63 9.560.50 - 2.131.05 mg/dL    Comment: For patients >54 years of age, the reference limit for Creatinine is approximately 13% higher for people identified as African-American. .    GFR, Est Non African American 102 > OR = 60 mL/min/1.8273m2   GFR, Est African American 119 > OR = 60 mL/min/1.7173m2   BUN/Creatinine Ratio NOT APPLICABLE 6 - 22 (calc)   Sodium 137 135 - 146 mmol/L   Potassium 4.0 3.5 - 5.3 mmol/L   Chloride 101 98 - 110 mmol/L   CO2 26 20 - 32 mmol/L   Calcium 9.6 8.6 - 10.4 mg/dL   Total Protein 7.1 6.1 - 8.1 g/dL   Albumin 4.2 3.6 - 5.1 g/dL   Globulin 2.9 1.9 - 3.7 g/dL (calc)   AG Ratio 1.4 1.0 - 2.5 (calc)   Total Bilirubin 0.5 0.2 - 1.2 mg/dL   Alkaline phosphatase (APISO) 155 (H) 37 - 153 U/L   AST 15 10 - 35 U/L   ALT 19 6 - 29 U/L  Magnesium     Status: None   Collection Time: 06/07/20  2:01 PM  Result Value Ref Range   Magnesium 2.0 1.5 - 2.5 mg/dL  Thyroid Panel With TSH     Status: None   Collection Time: 06/07/20  2:01 PM  Result Value Ref Range   T3 Uptake 26 22 - 35 %   T4, Total 7.7 5.1 - 11.9 mcg/dL   Free Thyroxine Index 2.0 1.4 - 3.8   TSH 2.37 mIU/L    Comment:           Reference Range .           > or = 20 Years  0.40-4.50 .                 Pregnancy Ranges           First trimester    0.26-2.66           Second trimester   0.55-2.73           Third trimester    0.43-2.91   Microalbumin / creatinine urine ratio     Status: None   Collection Time: 06/11/20  5:00 PM  Result Value Ref Range   Creatinine, Urine 55 20 - 275 mg/dL   Microalb, Ur 1.3 mg/dL    Comment: Reference Range Not established    Microalb Creat Ratio 24 <30 mcg/mg creat    Comment: . The ADA defines abnormalities in albumin excretion as follows: Marland Kitchen. Albuminuria Category        Result (mcg/mg creatinine) . Normal to Mildly increased   <30 Moderately increased         30-299  Severely increased           > OR = 300 . The ADA recommends that at least two of three specimens collected within a 3-6 month period be abnormal before considering a patient to be within a diagnostic category.  PHQ2/9: Depression screen [facility] 2/9 07/23/2020 06/07/2020 01/29/2020 10/24/2019 01/23/2019  Decreased Interest 0 0 0 0 0  Down, Depressed, Hopeless 0 0 0 1 0  PHQ - 2 Score 0 0 0 1 0  Altered sleeping - - 0 0 0  Tired, decreased energy - - 0 0 0  Change in appetite - - 0 0 0  Feeling bad or failure about yourself  - - 0 0 0  Trouble concentrating - - 0 0 0  Moving slowly or fidgety/restless - - 0 0 0  Suicidal thoughts - - 0 0 0  PHQ-9 Score - - 0 1 0  Difficult doing work/chores - - - Not difficult at all Not difficult at all    phq 9 is negative   Fall Risk: Fall Risk  07/23/2020 06/07/2020 01/29/2020 10/24/2019 01/23/2019  Falls in the past year? 0 0 0 0 0  Number falls in past yr: 0 0 0 0 0  Injury with Fall? 0 0 0 0 0     Functional Status Survey: Is the patient deaf or have difficulty hearing?: No Does the patient have difficulty seeing, even when wearing glasses/contacts?: No Does the patient have difficulty concentrating, remembering, or making decisions?: No Does the patient have difficulty walking or climbing stairs?: No Does the  patient have difficulty dressing or bathing?: No Does the patient have difficulty doing errands alone such as visiting a doctor's office or shopping?: No    Assessment & Plan  1. Hypertension, benign  - losartan (COZAAR) 25 MG tablet; Take 1 tablet (25 mg total) by mouth daily.  Dispense: 90 tablet; Refill: 1  2. B12 deficiency  Continue supplementation   3. Dyslipidemia   4. Dysmetabolic syndrome   5. Vitamin D deficiency  Continue supplementation   6. Insulin resistance   7. Perennial allergic rhinitis with seasonal variation  - Olopatadine HCl 0.2 % SOLN; INSTILL 1 DROP INTO BOTH EYES EVERY MORNING  Dispense: 2.5 mL; Refill: 3 - azelastine (ASTELIN) 0.1 % nasal spray; Place 1 spray into both nostrils 2 (two) times daily. Use in each nostril as directed  Dispense: 30 mL; Refill: 2  8. History of nephrolithiasis   9. Bee allergy status  - EPINEPHrine (EPIPEN 2-PAK) 0.3 mg/0.3 mL IJ SOAJ injection; Inject 0.3 mg into the muscle as needed for anaphylaxis.  Dispense: 1 each; Refill: 1

## 2020-07-23 ENCOUNTER — Encounter: Payer: Self-pay | Admitting: Family Medicine

## 2020-07-23 ENCOUNTER — Other Ambulatory Visit: Payer: Self-pay

## 2020-07-23 ENCOUNTER — Ambulatory Visit: Payer: BC Managed Care – PPO | Admitting: Family Medicine

## 2020-07-23 VITALS — BP 110/68 | HR 74 | Temp 98.2°F | Resp 16 | Ht 65.0 in | Wt 213.0 lb

## 2020-07-23 DIAGNOSIS — E538 Deficiency of other specified B group vitamins: Secondary | ICD-10-CM | POA: Diagnosis not present

## 2020-07-23 DIAGNOSIS — J302 Other seasonal allergic rhinitis: Secondary | ICD-10-CM

## 2020-07-23 DIAGNOSIS — E8881 Metabolic syndrome: Secondary | ICD-10-CM | POA: Diagnosis not present

## 2020-07-23 DIAGNOSIS — E559 Vitamin D deficiency, unspecified: Secondary | ICD-10-CM

## 2020-07-23 DIAGNOSIS — E785 Hyperlipidemia, unspecified: Secondary | ICD-10-CM

## 2020-07-23 DIAGNOSIS — J3089 Other allergic rhinitis: Secondary | ICD-10-CM

## 2020-07-23 DIAGNOSIS — Z91030 Bee allergy status: Secondary | ICD-10-CM

## 2020-07-23 DIAGNOSIS — I10 Essential (primary) hypertension: Secondary | ICD-10-CM | POA: Diagnosis not present

## 2020-07-23 DIAGNOSIS — Z87442 Personal history of urinary calculi: Secondary | ICD-10-CM

## 2020-07-23 MED ORDER — OLOPATADINE HCL 0.2 % OP SOLN: OPHTHALMIC | 3 refills | Status: DC

## 2020-07-23 MED ORDER — AZELASTINE HCL 0.1 % NA SOLN
1.0000 | Freq: Two times a day (BID) | NASAL | 2 refills | Status: DC
Start: 2020-07-23 — End: 2022-02-06

## 2020-07-23 MED ORDER — LOSARTAN POTASSIUM 25 MG PO TABS: 25.0000 mg | ORAL_TABLET | Freq: Every day | ORAL | 1 refills | Status: DC

## 2020-07-23 MED ORDER — EPINEPHRINE 0.3 MG/0.3ML IJ SOAJ: 0.3000 mg | INTRAMUSCULAR | 1 refills | Status: DC | PRN

## 2020-07-28 ENCOUNTER — Other Ambulatory Visit: Payer: Self-pay | Admitting: Family Medicine

## 2020-07-28 DIAGNOSIS — I10 Essential (primary) hypertension: Secondary | ICD-10-CM

## 2020-07-28 NOTE — Telephone Encounter (Signed)
Last ordered on 07/23/20 90 tabs with one refill.This refill request is no longer needed. Refusing at this time.

## 2020-10-14 ENCOUNTER — Ambulatory Visit: Payer: Self-pay | Admitting: *Deleted

## 2020-10-14 NOTE — Telephone Encounter (Signed)
Pt called stating that she believes she is having a reaction to her BP medications, losartan. She states that the dosage has been decreased recently. She states that she is feeling fatigued and some anxiety. Pt is concerned and is requesting to have a call back.  Reason for Disposition  [1] Fatigue (i.e., tires easily, decreased energy) AND [2] persists > 1 week  Answer Assessment - Initial Assessment Questions 1. DESCRIPTION: "Describe how you are feeling."     Increased fatigue, anxiety "A little"  Mood swings 2. SEVERITY: "How bad is it?"  "Can you stand and walk?"   - MILD - Feels weak or tired, but does not interfere with work, school or normal activities   - MODERATE - Able to stand and walk; weakness interferes with work, school, or normal activities   - SEVERE - Unable to stand or walk     MIld- moderate 3. ONSET:  "When did the weakness begin?"     3 weeks ago 4. CAUSE: "What do you think is causing the weakness?"     Losartan. 5. MEDICINES: "Have you recently started a new medicine or had a change in the amount of a medicine?"     Dose decreased in Feb. 6. OTHER SYMPTOMS: "Do you have any other symptoms?" (e.g., chest pain, fever, cough, SOB, vomiting, diarrhea, bleeding, other areas of pain)     NO  Protocols used: Weakness (Generalized) and Fatigue-A-AH

## 2020-10-14 NOTE — Telephone Encounter (Signed)
Pt reports increased fatigue x 3 weeks. Also reports "Moodiness" x 3 weeks. States was told to call if any problems with Losartan "Dose decreased in Feb. Ithink this may be from the Losartan." Reports BP ranging 128/72-74 to 118/70's. "Maybe I don't need it at all." Denies dizziness. Does state she has had to take off work at times "Just too tired to work."  No availability within protocol timeframe of 3 days.  Please advise : [phone]

## 2020-10-15 NOTE — Telephone Encounter (Signed)
Lvm for pt to call and schedule an appt  

## 2020-10-16 NOTE — Progress Notes (Signed)
Name: Tamara Nelson   MRN: [id number]    DOB: Jan 20, 1966   Date:10/17/2020       Progress Note  Subjective  Chief Complaint  Anxiety/fatigue  HPI  HTN: bp is towards low end of normal, taking half pill of losartan 25 mg daily . She denies side effects of medications. No chest pain, palpitation or osb  Fatigue: she has lack of motivation to go to work, last week she called in sick on Friday but was able to work in her garden and mow the yard. She feels aggravated with co-workers, bored with her job . She does not have much time off, works all day, has two jobs and gets home last, goes to bed a midnight and gets up at 6 am. Discussed burn out but also lack of sleep. Consider quitting part time job  B12 and Vitamin D deficiency: she stopped taking supplementation, we will recheck levels.     RUQ pain: she works in her yard, states picking up some heavy bags of dirt. No nausea, vomiting or change in appetite.   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   Abnormal serum level of alkaline phosphatase 10/17/2014   Menopause 10/17/2014   Irritant contact dermatitis 10/17/2014   Dysmetabolic syndrome 10/17/2014   Obesity (BMI 30-39.9) 10/17/2014   Vitamin D deficiency 10/17/2014   Allergic rhinitis, seasonal 10/17/2014    History reviewed. No pertinent surgical history.  Family History  Problem Relation Age of Onset   Cancer Mother        colon   Cancer Father        Liver and Lung   Stroke Father    Osteoarthritis Sister    Hypertension Brother    Diabetes Brother    Kidney cancer Brother    Diabetes Brother    Vascular Disease Brother    Breast cancer Neg Hx     Social History   Tobacco Use   Smoking status: Never   Smokeless tobacco: Never  Substance Use Topics   Alcohol use: No    Alcohol/week: 0.0 standard  drinks     Current Outpatient Medications:    ammonium lactate (LAC-HYDRIN) 12 % cream, Apply topically as needed for dry skin., Disp: 385 g, Rfl: 0   azelastine (ASTELIN) 0.1 % nasal spray, Place 1 spray into both nostrils 2 (two) times daily. Use in each nostril as directed, Disp: 30 mL, Rfl: 2   Cholecalciferol (VITAMIN D) 2000 UNITS CAPS, Take 1 capsule by mouth as needed., Disp: , Rfl:    desonide (DESOWEN) 0.05 % cream, Apply topically 2 (two) times daily., Disp: 30 g, Rfl: 0   EPINEPHrine (EPIPEN 2-PAK) 0.3 mg/0.3 mL IJ SOAJ injection, Inject 0.3 mg into the muscle as needed for anaphylaxis., Disp: 1 each, Rfl: 1   losartan (COZAAR) 25 MG tablet, Take 1 tablet (25 mg total) by mouth daily., Disp: 90 tablet, Rfl: 1   montelukast (SINGULAIR) 10 MG tablet, Take 1 tablet (10 mg total) by mouth at bedtime., Disp: 90 tablet, Rfl: 3   Olopatadine HCl 0.2 % SOLN, INSTILL 1 DROP INTO BOTH EYES EVERY MORNING, Disp: 2.5 mL, Rfl: 3   triamcinolone cream (KENALOG) 0.1 %, Apply topically 2 (two) times daily., Disp: 80 g, Rfl: 1   fluticasone (FLONASE) 50 MCG/ACT nasal spray, Place 2 sprays into both nostrils  daily. (Patient not taking: Reported on 10/17/2020), Disp: 48 g, Rfl: 1   levocetirizine (XYZAL) 5 MG tablet, Take 1 tablet (5 mg total) by mouth every evening. (Patient not taking: Reported on 10/17/2020), Disp: 90 tablet, Rfl: 3  Allergies  Allergen Reactions   Bee Venom    Mixed Vespid Venom     Other reaction(s): Unknown    I personally reviewed active problem list, medication list, allergies, family history, social history, health maintenance with the patient/caregiver today.   ROS  Constitutional: Negative for fever or weight change.  Respiratory: Negative for cough and shortness of breath.   Cardiovascular: Negative for chest pain or palpitations.  Gastrointestinal: Negative for abdominal pain, no bowel changes.  Musculoskeletal: Negative for gait problem or joint swelling.  Skin:  Negative for rash.  Neurological: Negative for dizziness or headache.  No other specific complaints in a complete review of systems (except as listed in HPI above).   Objective  Vitals:   10/17/20 0905  BP: 116/72  Pulse: 80  Resp: 16  Temp: 98.1 F (36.7 C)  TempSrc: Oral  SpO2: 98%  Weight: 209 lb 14.4 oz (95.2 kg)  Height: 5\' 5"  (1.651 m)    Body mass index is 34.93 kg/m.  Physical Exam  Constitutional: Patient appears well-developed and well-nourished. Obese  No distress.  HEENT: head atraumatic, normocephalic, pupils equal and reactive to light, neck supple Cardiovascular: Normal rate, regular rhythm and normal heart sounds.  No murmur heard. No BLE edema. Pulmonary/Chest: Effort normal and breath sounds normal. No respiratory distress. Abdominal: Soft.  There is no tenderness. Psychiatric: Patient has a normal mood and affect. behavior is normal. Judgment and thought content normal.   PHQ2/9: Depression screen [facility] 2/9 10/17/2020 07/23/2020 06/07/2020 01/29/2020 10/24/2019  Decreased Interest 2 0 0 0 0  Down, Depressed, Hopeless 0 0 0 0 1  PHQ - 2 Score 2 0 0 0 1  Altered sleeping 2 - - 0 0  Tired, decreased energy 2 - - 0 0  Change in appetite 0 - - 0 0  Feeling bad or failure about yourself  0 - - 0 0  Trouble concentrating 0 - - 0 0  Moving slowly or fidgety/restless 0 - - 0 0  Suicidal thoughts 0 - - 0 0  PHQ-9 Score 6 - - 0 1  Difficult doing work/chores Somewhat difficult - - - Not difficult at all  Some recent data might be hidden    phq 9 is positive   Fall Risk: Fall Risk  07/23/2020 06/07/2020 01/29/2020 10/24/2019 01/23/2019  Falls in the past year? 0 0 0 0 0  Number falls in past yr: 0 0 0 0 0  Injury with Fall? 0 0 0 0 0   Assessment & Plan   1. Colon cancer screening - Ambulatory referral to Gastroenterology  2. Hypertension, benign   3. B12 deficiency  - B12 and Folate Panel  4. Vitamin D deficiency  - VITAMIN D 25 Hydroxy (Vit-D  Deficiency, Fractures)  5. Elevated alkaline phosphatase level  - Hepatic function panel  6. Other fatigue  - B12 and Folate Panel - VITAMIN D 25 Hydroxy (Vit-D Deficiency, Fractures)  7. Burn-out  - TSH   Discussed symptoms    8. RUQ pain  Normal exam, aggravated by shifting position

## 2020-10-17 ENCOUNTER — Encounter: Payer: Self-pay | Admitting: Family Medicine

## 2020-10-17 ENCOUNTER — Ambulatory Visit: Payer: BC Managed Care – PPO | Admitting: Family Medicine

## 2020-10-17 ENCOUNTER — Other Ambulatory Visit: Payer: Self-pay

## 2020-10-17 ENCOUNTER — Telehealth: Payer: Self-pay

## 2020-10-17 VITALS — BP 116/72 | HR 80 | Temp 98.1°F | Resp 16 | Ht 65.0 in | Wt 209.9 lb

## 2020-10-17 DIAGNOSIS — E559 Vitamin D deficiency, unspecified: Secondary | ICD-10-CM

## 2020-10-17 DIAGNOSIS — R5383 Other fatigue: Secondary | ICD-10-CM | POA: Diagnosis not present

## 2020-10-17 DIAGNOSIS — R748 Abnormal levels of other serum enzymes: Secondary | ICD-10-CM

## 2020-10-17 DIAGNOSIS — E538 Deficiency of other specified B group vitamins: Secondary | ICD-10-CM

## 2020-10-17 DIAGNOSIS — Z1211 Encounter for screening for malignant neoplasm of colon: Secondary | ICD-10-CM | POA: Diagnosis not present

## 2020-10-17 DIAGNOSIS — I10 Essential (primary) hypertension: Secondary | ICD-10-CM | POA: Diagnosis not present

## 2020-10-17 DIAGNOSIS — R1011 Right upper quadrant pain: Secondary | ICD-10-CM

## 2020-10-17 DIAGNOSIS — Z730 Burn-out: Secondary | ICD-10-CM

## 2020-10-17 NOTE — Telephone Encounter (Signed)
Pt has a referral for colonoscopy but would like for someone to check to see if it is time. She want to make sure her insurance will cover

## 2020-10-17 NOTE — Telephone Encounter (Signed)
Spoke with patient and let her know it had been 5 years since her last colonoscopy so she should be covered under her insurance with her next one being due.

## 2020-10-18 ENCOUNTER — Other Ambulatory Visit: Payer: Self-pay

## 2020-10-18 DIAGNOSIS — Z1211 Encounter for screening for malignant neoplasm of colon: Secondary | ICD-10-CM

## 2020-10-18 LAB — B12 AND FOLATE PANEL
Folate: 8.2 ng/mL
Vitamin B-12: 266 pg/mL (ref 200–1100)

## 2020-10-18 LAB — HEPATIC FUNCTION PANEL
AG Ratio: 1.8 (calc) (ref 1.0–2.5)
ALT: 16 U/L (ref 6–29)
AST: 15 U/L (ref 10–35)
Albumin: 4 g/dL (ref 3.6–5.1)
Alkaline phosphatase (APISO): 122 U/L (ref 37–153)
Bilirubin, Direct: 0.1 mg/dL (ref 0.0–0.2)
Globulin: 2.2 g/dL (calc) (ref 1.9–3.7)
Indirect Bilirubin: 0.2 mg/dL (calc) (ref 0.2–1.2)
Total Bilirubin: 0.3 mg/dL (ref 0.2–1.2)
Total Protein: 6.2 g/dL (ref 6.1–8.1)

## 2020-10-18 LAB — VITAMIN D 25 HYDROXY (VIT D DEFICIENCY, FRACTURES): Vit D, 25-Hydroxy: 29 ng/mL — ABNORMAL LOW (ref 30–100)

## 2020-10-18 LAB — TSH: TSH: 2.22 mIU/L

## 2020-10-18 MED ORDER — PEG 3350-KCL-NA BICARB-NACL 420 G PO SOLR: ORAL | 0 refills | Status: DC

## 2020-10-24 ENCOUNTER — Telehealth: Payer: Self-pay | Admitting: *Deleted

## 2020-10-24 ENCOUNTER — Encounter: Payer: Self-pay | Admitting: Gastroenterology

## 2020-10-24 NOTE — Telephone Encounter (Signed)
Patient is calling for her lab results- she has not been able to access her MyChart-. Patient advised per PCP note:  B12 is low, you can take otc B12 1000 mcg sub lingually, or come in for B12injections monthly  Vitamin D is slightly low, please take otc vitamin D 2000 units daily  Normal TSH and liver enzymes  Patient is going to try OTC sublingual first and see how she does.

## 2020-11-08 ENCOUNTER — Other Ambulatory Visit: Payer: Self-pay

## 2020-11-08 ENCOUNTER — Ambulatory Visit: Payer: BC Managed Care – PPO | Admitting: Anesthesiology

## 2020-11-08 ENCOUNTER — Encounter
Admission: RE | Disposition: A | Discharge status: Home / Self Care | Payer: Self-pay | Source: Home / Self Care | Attending: Gastroenterology

## 2020-11-08 ENCOUNTER — Ambulatory Visit
Admission: RE | Admit: 2020-11-08 | Discharge: 2020-11-08 | Disposition: A | Discharge status: Home / Self Care | Payer: BC Managed Care – PPO | Attending: Gastroenterology | Admitting: Gastroenterology

## 2020-11-08 ENCOUNTER — Encounter: Payer: Self-pay | Admitting: Gastroenterology

## 2020-11-08 DIAGNOSIS — K5730 Diverticulosis of large intestine without perforation or abscess without bleeding: Secondary | ICD-10-CM | POA: Insufficient documentation

## 2020-11-08 DIAGNOSIS — K640 First degree hemorrhoids: Secondary | ICD-10-CM | POA: Insufficient documentation

## 2020-11-08 DIAGNOSIS — Z1211 Encounter for screening for malignant neoplasm of colon: Secondary | ICD-10-CM | POA: Diagnosis not present

## 2020-11-08 DIAGNOSIS — Z79899 Other long term (current) drug therapy: Secondary | ICD-10-CM | POA: Diagnosis not present

## 2020-11-08 DIAGNOSIS — Z91030 Bee allergy status: Secondary | ICD-10-CM | POA: Diagnosis not present

## 2020-11-08 DIAGNOSIS — Z800 Family history of malignant neoplasm of digestive organs: Secondary | ICD-10-CM | POA: Diagnosis not present

## 2020-11-08 HISTORY — PX: COLONOSCOPY WITH PROPOFOL: SHX5780

## 2020-11-08 SURGERY — COLONOSCOPY WITH PROPOFOL
Anesthesia: General | Site: Rectum

## 2020-11-08 MED ORDER — SODIUM CHLORIDE 0.9 % IV SOLN
INTRAVENOUS | Status: DC
Start: 2020-11-08 — End: 2020-11-08

## 2020-11-08 MED ORDER — LACTATED RINGERS IV SOLN: INTRAVENOUS | Status: DC

## 2020-11-08 MED ORDER — STERILE WATER FOR IRRIGATION IR SOLN
Status: DC | PRN
  Administered 2020-11-08: .05 mL

## 2020-11-08 MED ORDER — PROPOFOL 10 MG/ML IV BOLUS
INTRAVENOUS | Status: DC | PRN
  Administered 2020-11-08: 30 mg via INTRAVENOUS
  Administered 2020-11-08: 40 mg via INTRAVENOUS
  Administered 2020-11-08: 150 mg via INTRAVENOUS
  Administered 2020-11-08: 30 mg via INTRAVENOUS
  Administered 2020-11-08: 50 mg via INTRAVENOUS
  Administered 2020-11-08: 30 mg via INTRAVENOUS

## 2020-11-08 MED ORDER — GLYCOPYRROLATE 0.2 MG/ML IJ SOLN
INTRAMUSCULAR | Status: DC | PRN
  Administered 2020-11-08: .4 mg via INTRAVENOUS

## 2020-11-08 SURGICAL SUPPLY — 6 items
GOWN CVR UNV OPN BCK APRN NK (MISCELLANEOUS) ×2 IMPLANT
GOWN ISOL THUMB LOOP REG UNIV (MISCELLANEOUS) ×4
KIT PRC NS LF DISP ENDO (KITS) ×1 IMPLANT
KIT PROCEDURE OLYMPUS (KITS) ×2
MANIFOLD NEPTUNE II (INSTRUMENTS) ×2 IMPLANT
WATER STERILE IRR 250ML POUR (IV SOLUTION) ×2 IMPLANT

## 2020-11-08 NOTE — H&P (Signed)
Midge Minium, MD [facility] [address], [address] [place], [place] [address] Phone:[phone] Fax : [phone]  Primary Care Physician:  Alba Cory, MD Primary Gastroenterologist:  Dr. Servando Snare  Pre-Procedure History & Physical: HPI:  Tamara Nelson is a 55 y.o. female is here for an colonoscopy.   Past Medical History:  Diagnosis Date   Alkaline phosphatase raised    Allergy    Irritant contact dermatitis    Metabolic syndrome    Vitamin D deficiency     History reviewed. No pertinent surgical history.  Prior to Admission medications   Medication Sig Start Date End Date Taking? Authorizing Provider  ammonium lactate (LAC-HYDRIN) 12 % cream Apply topically as needed for dry skin. 04/14/19  Yes Sowles, Danna Hefty, MD  azelastine (ASTELIN) 0.1 % nasal spray Place 1 spray into both nostrils 2 (two) times daily. Use in each nostril as directed 07/23/20  Yes Sowles, Danna Hefty, MD  Cholecalciferol (VITAMIN D) 2000 UNITS CAPS Take 1 capsule by mouth as needed. 06/20/10  Yes [provider]  desonide (DESOWEN) 0.05 % cream Apply topically 2 (two) times daily. 01/29/20  Yes Sowles, Danna Hefty, MD  levocetirizine (XYZAL) 5 MG tablet Take 1 tablet (5 mg total) by mouth every evening. 07/09/20  Yes Sowles, Danna Hefty, MD  losartan (COZAAR) 25 MG tablet Take 1 tablet (25 mg total) by mouth daily. 07/23/20  Yes Sowles, Danna Hefty, MD  montelukast (SINGULAIR) 10 MG tablet Take 1 tablet (10 mg total) by mouth at bedtime. 10/24/19  Yes Sowles, Danna Hefty, MD  Olopatadine HCl 0.2 % SOLN INSTILL 1 DROP INTO BOTH EYES EVERY MORNING 07/23/20  Yes Sowles, Danna Hefty, MD  polyethylene glycol-electrolytes (GAVILYTE-N WITH FLAVOR PACK) 420 g solution Drink one 8 oz glass every 20 mins until entire container is finished starting at 5:00pm on 11/07/20 10/18/20  Yes Arryn Terrones, MD  triamcinolone cream (KENALOG) 0.1 % Apply topically 2 (two) times daily. 12/01/16  Yes Sowles, Danna Hefty, MD  EPINEPHrine (EPIPEN 2-PAK) 0.3  mg/0.3 mL IJ SOAJ injection Inject 0.3 mg into the muscle as needed for anaphylaxis. 07/23/20   Alba Cory, MD  fluticasone (FLONASE) 50 MCG/ACT nasal spray Place 2 sprays into both nostrils daily. Patient not taking: Reported on 10/17/2020 10/23/19   Alba Cory, MD    Allergies as of 10/18/2020 - Review Complete 10/17/2020  Allergen Reaction Noted   Bee venom  10/17/2014   Mixed vespid venom  02/13/2020    Family History  Problem Relation Age of Onset   Cancer Mother        colon   Cancer Father        Liver and Lung   Stroke Father    Osteoarthritis Sister    Hypertension Brother    Diabetes Brother    Kidney cancer Brother    Diabetes Brother    Vascular Disease Brother    Breast cancer Neg Hx     Social History   Socioeconomic History   Marital status: Single    Spouse name: Not on file   Number of children: 0   Years of education: Not on file   Highest education level: High school graduate  Occupational History   Occupation: Research scientist (medical)     Employer: [employer]  Tobacco Use   Smoking status: Never   Smokeless tobacco: Never  Vaping Use   Vaping Use: Never used  Substance and Sexual Activity   Alcohol use: No    Alcohol/week: 0.0 standard drinks   Drug use: No  Sexual activity: Never  Other Topics Concern   Not on file  Social History Narrative   Lives alone, works at [employer] during the day and for [employer] at night, 5 nights weekly    Social Determinants of Corporate investment banker Strain: Low Risk    Difficulty of Paying Living Expenses: Not hard at all  Food Insecurity: No Food Insecurity   Worried About Programme researcher, broadcasting/film/video in the Last Year: Never true   Barista in the Last Year: Never true  Transportation Needs: No Transportation Needs   Lack of Transportation (Medical): No   Lack of Transportation (Non-Medical): No  Physical Activity: Insufficiently Active   Days of Exercise per Week: 1 day    Minutes of Exercise per Session: 120 min  Stress: No Stress Concern Present   Feeling of Stress : Only a little  Social Connections: Socially Isolated   Frequency of Communication with Friends and Family: More than three times a week   Frequency of Social Gatherings with Friends and Family: Not on file   Attends Religious Services: Never   Database administrator or Organizations: No   Attends Engineer, structural: Never   Marital Status: Never married  Catering manager Violence: Not At Risk   Fear of Current or Ex-Partner: No   Emotionally Abused: No   Physically Abused: No   Sexually Abused: No    Review of Systems: See HPI, otherwise negative ROS  Physical Exam: BP 139/72   Pulse 61   Temp (!) 97.3 F (36.3 C) (Temporal)   Resp 18   Ht 5\' 5"  (1.651 m)   Wt 94.3 kg   SpO2 99%   BMI 34.61 kg/m  General:   Alert,  pleasant and cooperative in NAD Head:  Normocephalic and atraumatic. Neck:  Supple; no masses or thyromegaly. Lungs:  Clear throughout to auscultation.    Heart:  Regular rate and rhythm. Abdomen:  Soft, nontender and nondistended. Normal bowel sounds, without guarding, and without rebound.   Neurologic:  Alert and  oriented x4;  grossly normal neurologically.  Impression/Plan: Tamara Nelson is here for an colonoscopy to be performed for mother with colon cancer in 50's  Risks, benefits, limitations, and alternatives regarding  colonoscopy have been reviewed with the patient.  Questions have been answered.  All parties agreeable.   09-[birth year]-1986, MD  11/08/2020, 10:39 AM

## 2020-11-08 NOTE — Op Note (Signed)
[facility] Gastroenterology Patient Name: Tamara Nelson Procedure Date: 11/08/2020 11:24 AM MRN: [id number] Account #: [id number] Date of Birth: [birth year] Admit Type: Outpatient Age: 55 Room: [facility] OR ROOM 01 Gender: Female Note Status: Finalized Procedure:             Colonoscopy Indications:           Screening for colorectal malignant neoplasm, Screening                         in patient at increased risk: Family history of                         1st-degree relative with colorectal cancer before age                         37 years Providers:             Midge Minium MD, MD Referring MD:          Onnie Boer. Sowles, MD (Referring MD) Medicines:             Propofol per Anesthesia Complications:         No immediate complications. Procedure:             Pre-Anesthesia Assessment:                        - Prior to the procedure, a History and Physical was                         performed, and patient medications and allergies were                         reviewed. The patient's tolerance of previous                         anesthesia was also reviewed. The risks and benefits                         of the procedure and the sedation options and risks                         were discussed with the patient. All questions were                         answered, and informed consent was obtained. Prior                         Anticoagulants: The patient has taken no previous                         anticoagulant or antiplatelet agents. ASA Grade                         Assessment: II - A patient with mild systemic disease.                         After reviewing the risks and benefits, the patient  was deemed in satisfactory condition to undergo the                         procedure.                        After obtaining informed consent, the colonoscope was                         passed under direct vision. Throughout the procedure,                          the patient's blood pressure, pulse, and oxygen                         saturations were monitored continuously. The                         Colonoscope was introduced through the anus and                         advanced to the the cecum, identified by appendiceal                         orifice and ileocecal valve. The colonoscopy was                         performed without difficulty. The patient tolerated                         the procedure well. The quality of the bowel                         preparation was excellent. Findings:      The perianal and digital rectal examinations were normal.      Non-bleeding internal hemorrhoids were found during retroflexion. The       hemorrhoids were Grade I (internal hemorrhoids that do not prolapse).      A few small-mouthed diverticula were found in the sigmoid colon. Impression:            - Non-bleeding internal hemorrhoids.                        - Diverticulosis in the sigmoid colon.                        - No specimens collected. Recommendation:        - Discharge patient to home.                        - Resume previous diet.                        - Continue present medications.                        - Repeat colonoscopy in 5 years for surveillance. Procedure Code(s):     --- Professional ---                        [phone], Colonoscopy,  flexible; diagnostic, including                         collection of specimen(s) by brushing or washing, when                         performed (separate procedure) Diagnosis Code(s):     --- Professional ---                        Z12.11, Encounter for screening for malignant neoplasm                         of colon CPT copyright 2019 American Medical Association. All rights reserved. The codes documented in this report are preliminary and upon coder review may  be revised to meet current compliance requirements. Midge Minium MD, MD 11/08/2020 11:51:45 AM This report has been  signed electronically. Number of Addenda: 0 Note Initiated On: 11/08/2020 11:24 AM Scope Withdrawal Time: 0 hours 6 minutes 36 seconds  Total Procedure Duration: 0 hours 11 minutes 10 seconds  Estimated Blood Loss:  Estimated blood loss: none.      [facility]

## 2020-11-08 NOTE — Transfer of Care (Signed)
Immediate Anesthesia Transfer of Care Note  Patient: Tamara Nelson  Procedure(s) Performed: COLONOSCOPY WITH PROPOFOL (Rectum)  Patient Location: PACU  Anesthesia Type: General  Level of Consciousness: awake, alert  and patient cooperative  Airway and Oxygen Therapy: Patient Spontanous Breathing and Patient connected to supplemental oxygen  Post-op Assessment: Post-op Vital signs reviewed, Patient's Cardiovascular Status Stable, Respiratory Function Stable, Patent Airway and No signs of Nausea or vomiting  Post-op Vital Signs: Reviewed and stable  Complications: No notable events documented.

## 2020-11-08 NOTE — Anesthesia Preprocedure Evaluation (Signed)
Anesthesia Evaluation  Patient identified by MRN, date of birth, ID band Patient awake    Reviewed: Allergy & Precautions, NPO status   Airway Mallampati: II  TM Distance: >3 FB     Dental   Pulmonary    Pulmonary exam normal        Cardiovascular hypertension,  Rhythm:Regular Rate:Normal  Dyslipidemia   Neuro/Psych    GI/Hepatic   Endo/Other  Obesity - BMI > 30  Renal/GU      Musculoskeletal   Abdominal   Peds  Hematology   Anesthesia Other Findings   Reproductive/Obstetrics                             Anesthesia Physical Anesthesia Plan  ASA: 2  Anesthesia Plan: General   Post-op Pain Management:    Induction: Intravenous  PONV Risk Score and Plan: Propofol infusion, TIVA and Treatment may vary due to age or medical condition  Airway Management Planned: Natural Airway and Nasal Cannula  Additional Equipment:   Intra-op Plan:   Post-operative Plan:   Informed Consent: I have reviewed the patients History and Physical, chart, labs and discussed the procedure including the risks, benefits and alternatives for the proposed anesthesia with the patient or authorized representative who has indicated his/her understanding and acceptance.       Plan Discussed with: CRNA  Anesthesia Plan Comments:         Anesthesia Quick Evaluation

## 2020-11-08 NOTE — Anesthesia Postprocedure Evaluation (Signed)
Anesthesia Post Note  Patient: Tamara Nelson  Procedure(s) Performed: COLONOSCOPY WITH PROPOFOL (Rectum)     Patient location during evaluation: PACU Anesthesia Type: General Level of consciousness: awake Pain management: pain level controlled Vital Signs Assessment: post-procedure vital signs reviewed and stable Respiratory status: respiratory function stable Cardiovascular status: stable Postop Assessment: no signs of nausea or vomiting Anesthetic complications: no   No notable events documented.  Jola Babinski

## 2020-11-11 ENCOUNTER — Encounter: Payer: Self-pay | Admitting: Gastroenterology

## 2020-11-15 ENCOUNTER — Ambulatory Visit (INDEPENDENT_AMBULATORY_CARE_PROVIDER_SITE_OTHER): Payer: BC Managed Care – PPO

## 2020-11-15 ENCOUNTER — Other Ambulatory Visit: Payer: Self-pay

## 2020-11-15 DIAGNOSIS — E538 Deficiency of other specified B group vitamins: Secondary | ICD-10-CM

## 2020-11-15 MED ORDER — CYANOCOBALAMIN 1000 MCG/ML IJ SOLN
1000.0000 ug | Freq: Once | INTRAMUSCULAR | Status: AC
  Administered 2020-11-15: 1000 ug via INTRAMUSCULAR

## 2020-12-02 ENCOUNTER — Encounter: Payer: Self-pay | Admitting: Family Medicine

## 2020-12-02 ENCOUNTER — Telehealth (INDEPENDENT_AMBULATORY_CARE_PROVIDER_SITE_OTHER): Payer: BC Managed Care – PPO | Admitting: Family Medicine

## 2020-12-02 ENCOUNTER — Other Ambulatory Visit: Payer: Self-pay

## 2020-12-02 VITALS — HR 84

## 2020-12-02 DIAGNOSIS — J029 Acute pharyngitis, unspecified: Secondary | ICD-10-CM

## 2020-12-02 DIAGNOSIS — R059 Cough, unspecified: Secondary | ICD-10-CM

## 2020-12-02 MED ORDER — PREDNISONE 10 MG PO TABS: 10.0000 mg | ORAL_TABLET | Freq: Every day | ORAL | 0 refills | Status: DC

## 2020-12-02 MED ORDER — BENZONATATE 100 MG PO CAPS: 100.0000 mg | ORAL_CAPSULE | Freq: Two times a day (BID) | ORAL | 0 refills | Status: DC | PRN

## 2020-12-02 NOTE — Progress Notes (Signed)
Name: Tamara Nelson   MRN: [id number]    DOB: 1966-04-24   Date:12/02/2020       Progress Note  Subjective  Chief Complaint  Possible COVID  I connected with  Robyn Haber on 12/02/20 at  9:20 AM EDT by telephone and verified that I am speaking with the correct person using two identifiers.  I discussed the limitations, risks, security and privacy concerns of performing an evaluation and management service by telephone and the availability of in person appointments. Staff also discussed with the patient that there may be a patient responsible charge related to this service. Patient agreed on having a virtual visit  Patient Location: car in our parking lot  Provider Location: [facility] Additional Individuals present: alone   HPI  Viral Illness: she developed sore throat, hoarseness, nasal congestion, headache, dry cough , eyes are itchy . Denies fever or chills. She states it feels like allergies. Explained COVID numbers are high again and advised to social isolate, we will give her an excuse for work.   She had two COVID-19 vaccines but not a booster   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   Abnormal serum level of alkaline phosphatase 10/17/2014   Menopause 10/17/2014   Irritant contact dermatitis 10/17/2014   Dysmetabolic syndrome 10/17/2014   Obesity (BMI 30-39.9) 10/17/2014   Vitamin D deficiency 10/17/2014   Allergic rhinitis, seasonal 10/17/2014    Past Surgical History:  Procedure Laterality Date   COLONOSCOPY WITH PROPOFOL N/A 11/08/2020   Procedure: COLONOSCOPY WITH PROPOFOL;  Surgeon: Midge Minium, MD;  Location: [facility];  Service: Endoscopy;  Laterality: N/A;    Family History  Problem Relation Age of Onset   Cancer Mother        colon   Cancer Father        Liver and Lung   Stroke  Father    Osteoarthritis Sister    Hypertension Brother    Diabetes Brother    Kidney cancer Brother    Diabetes Brother    Vascular Disease Brother    Breast cancer Neg Hx     Social History   Socioeconomic History   Marital status: Single    Spouse name: Not on file   Number of children: 0   Years of education: Not on file   Highest education level: High school graduate  Occupational History   Occupation: Research scientist (medical)     Employer: [employer]  Tobacco Use   Smoking status: Never   Smokeless tobacco: Never  Vaping Use   Vaping Use: Never used  Substance and Sexual Activity   Alcohol use: No    Alcohol/week: 0.0 standard drinks   Drug use: No   Sexual activity: Never  Other Topics Concern   Not on file  Social History Narrative   Lives alone, 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, 5 nights weekly    Social Determinants of Corporate investment banker Strain: Low Risk    Difficulty of Paying Living Expenses: Not hard at all  Food Insecurity: No Food Insecurity   Worried About Programme researcher, broadcasting/film/video in the Last Year: Never true   Barista in the Last Year: Never true  Transportation Needs: No Regulatory affairs officer (Medical): No  Lack of Transportation (Non-Medical): No  Physical Activity: Insufficiently Active   Days of Exercise per Week: 1 day   Minutes of Exercise per Session: 120 min  Stress: No Stress Concern Present   Feeling of Stress : Only a little  Social Connections: Socially Isolated   Frequency of Communication with Friends and Family: More than three times a week   Frequency of Social Gatherings with Friends and Family: Not on file   Attends Religious Services: Never   Database administrator or Organizations: No   Attends Engineer, structural: Never   Marital Status: Never married  Catering manager Violence: Not At Risk   Fear of Current or Ex-Partner: No    Emotionally Abused: No   Physically Abused: No   Sexually Abused: No     Current Outpatient Medications:    ammonium lactate (LAC-HYDRIN) 12 % cream, Apply topically as needed for dry skin., Disp: 385 g, Rfl: 0   azelastine (ASTELIN) 0.1 % nasal spray, Place 1 spray into both nostrils 2 (two) times daily. Use in each nostril as directed, Disp: 30 mL, Rfl: 2   Cholecalciferol (VITAMIN D) 2000 UNITS CAPS, Take 1 capsule by mouth as needed., Disp: , Rfl:    desonide (DESOWEN) 0.05 % cream, Apply topically 2 (two) times daily., Disp: 30 g, Rfl: 0   EPINEPHrine (EPIPEN 2-PAK) 0.3 mg/0.3 mL IJ SOAJ injection, Inject 0.3 mg into the muscle as needed for anaphylaxis., Disp: 1 each, Rfl: 1   fluticasone (FLONASE) 50 MCG/ACT nasal spray, Place 2 sprays into both nostrils daily., Disp: 48 g, Rfl: 1   levocetirizine (XYZAL) 5 MG tablet, Take 1 tablet (5 mg total) by mouth every evening., Disp: 90 tablet, Rfl: 3   losartan (COZAAR) 25 MG tablet, Take 1 tablet (25 mg total) by mouth daily., Disp: 90 tablet, Rfl: 1   montelukast (SINGULAIR) 10 MG tablet, Take 1 tablet (10 mg total) by mouth at bedtime., Disp: 90 tablet, Rfl: 3   Olopatadine HCl 0.2 % SOLN, INSTILL 1 DROP INTO BOTH EYES EVERY MORNING, Disp: 2.5 mL, Rfl: 3   triamcinolone cream (KENALOG) 0.1 %, Apply topically 2 (two) times daily., Disp: 80 g, Rfl: 1  Allergies  Allergen Reactions   Bee Venom    Mixed Vespid Venom     Other reaction(s): Unknown    I personally reviewed active problem list, medication list, allergies, family history with the patient/caregiver today.   ROS  Ten systems reviewed and is negative except as mentioned in HPI   Objective  Heart rage 84, respiration 12, pulse ox 92 % - checked in the parking lot   There is no height or weight on file to calculate BMI.  Physical Exam  Constitutional: Patient appears well-developed and well-nourished. Obese  No distress.  HEENT: head atraumatic,  normocephalic Cardiovascular: Normal rate, regular rhythm and normal heart sounds.  No murmur heard. No BLE edema. Pulmonary/Chest: Effort normal and breath sounds normal. No respiratory distress. Psychiatric: Patient has a normal mood and affect. behavior is normal. Judgment and thought content normal.    Assessment & Plan  1. Sore throat  - Novel Coronavirus, NAA ([facility]) - predniSONE (DELTASONE) 10 MG tablet; Take 1 tablet (10 mg total) by mouth daily with breakfast.  Dispense: 10 tablet; Refill: 0 - benzonatate (TESSALON) 100 MG capsule; Take 1-2 capsules (100-200 mg total) by mouth 2 (two) times daily as needed.  Dispense: 40 capsule; Refill: 0  2. Cough  - predniSONE (DELTASONE)  10 MG tablet; Take 1 tablet (10 mg total) by mouth daily with breakfast.  Dispense: 10 tablet; Refill: 0 - benzonatate (TESSALON) 100 MG capsule; Take 1-2 capsules (100-200 mg total) by mouth 2 (two) times daily as needed.  Dispense: 40 capsule; Refill: 0  I discussed the assessment and treatment plan with the patient. The patient was provided an opportunity to ask questions and all were answered. The patient agreed with the plan and demonstrated an understanding of the instructions.   The patient was advised to call back or seek an in-person evaluation if the symptoms worsen or if the condition fails to improve as anticipated.  I provided 15  minutes of non-face-to-face time during this encounter.  Ruel Favors, MD

## 2020-12-03 LAB — SARS-COV-2, NAA 2 DAY TAT

## 2020-12-03 LAB — NOVEL CORONAVIRUS, NAA: SARS-CoV-2, NAA: DETECTED — AB

## 2020-12-04 ENCOUNTER — Telehealth: Payer: Self-pay

## 2020-12-04 NOTE — Telephone Encounter (Signed)
Patient notified of positive COVID-19 test results as noted by Dr. Carlynn Purl on 12/03/20.  Pt verbalized understanding. Pt reports symptoms of fatigue, coughing mainly at night onset 11/30/20. Criteria for self-isolation if you test positive for COVID-19, regardless of vaccination status:  -If you have mild symptoms that are resolving or have resolved, isolate at home for 5 days since symptoms started AND continue to wear a well-fitted mask when around others in the home and in public for 5 additional days after isolation is completed -If you have a fever and/or moderate to severe symptoms, isolate for at least 10 days since the symptoms started AND until you are fever free for at least 24 hours without the use of fever-reducing medications -If you tested positive and did not have symptoms, isolate for at least 5 days after your positive test  Use over-the-counter medications for symptoms.If you develop respiratory issues/distress, seek medical care in the Emergency Department.  If you must leave home or if you have to be around others please wear a mask. Please limit contact with immediate family members in the home, practice social distancing, frequent handwashing and clean hard surfaces touched frequently with household cleaning products. Members of your household will also need to quarantine and test.You may also be contacted by the health department for follow up. [facility] Department notified.    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 I advised I will send this request to Dr. Carlynn Purl and someone will call when it's ready for pickup.

## 2020-12-23 ENCOUNTER — Other Ambulatory Visit: Payer: Self-pay

## 2020-12-23 ENCOUNTER — Ambulatory Visit (INDEPENDENT_AMBULATORY_CARE_PROVIDER_SITE_OTHER): Payer: BC Managed Care – PPO

## 2020-12-23 DIAGNOSIS — E538 Deficiency of other specified B group vitamins: Secondary | ICD-10-CM | POA: Diagnosis not present

## 2020-12-23 MED ORDER — CYANOCOBALAMIN 1000 MCG/ML IJ SOLN
1000.0000 ug | Freq: Once | INTRAMUSCULAR | Status: AC
  Administered 2020-12-23: 1000 ug via INTRAMUSCULAR

## 2021-01-20 ENCOUNTER — Other Ambulatory Visit: Payer: Self-pay | Admitting: Family Medicine

## 2021-01-20 ENCOUNTER — Ambulatory Visit: Payer: Self-pay | Admitting: *Deleted

## 2021-01-20 ENCOUNTER — Other Ambulatory Visit: Payer: Self-pay

## 2021-01-20 ENCOUNTER — Encounter: Payer: Self-pay | Admitting: Nurse Practitioner

## 2021-01-20 ENCOUNTER — Ambulatory Visit: Payer: BC Managed Care – PPO | Admitting: Nurse Practitioner

## 2021-01-20 VITALS — BP 130/78 | HR 75 | Temp 98.3°F | Resp 16 | Ht 65.0 in | Wt 213.0 lb

## 2021-01-20 DIAGNOSIS — I10 Essential (primary) hypertension: Secondary | ICD-10-CM

## 2021-01-20 DIAGNOSIS — T63441A Toxic effect of venom of bees, accidental (unintentional), initial encounter: Secondary | ICD-10-CM

## 2021-01-20 MED ORDER — TRIAMCINOLONE ACETONIDE 40 MG/ML IJ SUSP
40.0000 mg | Freq: Once | INTRAMUSCULAR | Status: AC
  Administered 2021-01-20: 40 mg via INTRAMUSCULAR

## 2021-01-20 MED ORDER — PREDNISONE 10 MG PO TABS
ORAL_TABLET | ORAL | 0 refills | Status: DC
Start: 2021-01-20 — End: 2021-09-01

## 2021-01-20 NOTE — Progress Notes (Signed)
   BP 130/78   Pulse 75   Temp 98.3 F (36.8 C)   Resp 16   Ht 5\' 5"  (1.651 m)   Wt 213 lb (96.6 kg)   SpO2 97%   BMI 35.45 kg/m    Subjective:    Patient ID: , female    DOB: 1965-09-11, 55 y.o.   MRN: [id number]  HPI: Tamara Nelson is a 55 y.o. female, here alone  Chief Complaint  Patient presents with   Insect Bite    X3 days, itching, L knee/R ankle   Bee sting/ allergic reaction: She says she is allergic to bees.  She was stung on Saturday on the Left knee and the right lower leg.  She says she has been taking benadryl but it continues to get bigger.  She says it is very itchy.  She says she has been using steroid cream on it with really no improvement.  Encouraged her to use ice pack to help with swelling and itching.  Discussed adding Pepcid and to continue taking benadryl.  She will be given a steroid shot here and a steroid taper.  Discussed if she has any trouble breathing or swallowing to seek emergency care.   Relevant past medical, surgical, family and social history reviewed and updated as indicated. Interim medical history since our last visit reviewed. Allergies and medications reviewed and updated.  Review of Systems  Constitutional: Negative for fever or weight change.  Respiratory: Negative for cough and shortness of breath.   Cardiovascular: Negative for chest pain or palpitations.  Gastrointestinal: Negative for abdominal pain, no bowel changes.  Musculoskeletal: Negative for gait problem or joint swelling.  Skin: Positive for hives and redness Neurological: Negative for dizziness or headache.  No other specific complaints in a complete review of systems (except as listed in HPI above).      Objective:    BP 130/78   Pulse 75   Temp 98.3 F (36.8 C)   Resp 16   Ht 5\' 5"  (1.651 m)   Wt 213 lb (96.6 kg)   SpO2 97%   BMI 35.45 kg/m   Wt Readings from Last 3 Encounters:  01/20/21 213 lb (96.6 kg)  11/08/20 208 lb (94.3 kg)   10/17/20 209 lb 14.4 oz (95.2 kg)    Physical Exam  Constitutional: Patient appears well-developed and well-nourished. No distress.  HEENT: head atraumatic, normocephalic, pupils equal and reactive to light, neck supple Cardiovascular: Normal rate, regular rhythm and normal heart sounds.  No murmur heard. No BLE edema. Pulmonary/Chest: Effort normal and breath sounds normal. No respiratory distress. Abdominal: Soft.  There is no tenderness. Skin: left knee 5 cm x 3 cm area of redness and swelling, right lower leg 12 cm x 4 cm area of redness and swelling Psychiatric: Patient has a normal mood and affect. behavior is normal. Judgment and thought content normal.     Assessment & Plan:   1. Allergic reaction to bee sting  - continue taking benadryl add Pepcid - triamcinolone acetonide (KENALOG-40) injection 40 mg - Start tomorrow predniSONE (DELTASONE) 10 MG tablet; Taper dose, Day 1 take 6 tablet, day 2 take 5 tablet, day 3 take 4 tablet, day 4 take 2 tablet, day 5 take 1 tablet.  Dispense: 21 tablet; Refill: 0   Follow up plan: Return if symptoms worsen or fail to improve.

## 2021-01-20 NOTE — Telephone Encounter (Signed)
Reason for Disposition . [1] Red or very tender (to touch) area AND [2] started over 24 hours after the sting  Answer Assessment - Initial Assessment Questions 1. TYPE: "What type of sting was it?" (bee, yellow jacket, etc.)      Not sure 2. ONSET: "When did it occur?"      Saturday afternoon 3. LOCATION: "Where is the sting located?"  "How many stings?"     Both leg- L- above knees, R leg- shin 4. SWELLING SIZE: "How big is the swelling?" (e.g., inches or cm)     Swelling from bee sting 5. REDNESS: "Is the area red or pink?" If Yes, ask: "What size is area of redness?" (e.g., inches or cm). "When did the redness start?"     Yes- red, warm 6. PAIN: "Is there any pain?" If Yes, ask: "How bad is it?"  (Scale 1-10; or mild, moderate, severe)     no 7. ITCHING: "Is there any itching?" If Yes, ask: "How bad is it?"      Yes- moderate 8. RESPIRATORY DISTRESS: "Describe your breathing."     no 9. PRIOR REACTIONS: "Have you had any severe allergic reactions to stings in the past?" if yes, ask: "What happened?"     no 10. OTHER SYMPTOMS: "Do you have any other symptoms?" (e.g., abdominal pain, face or tongue swelling, new rash elsewhere, vomiting)       no 11. PREGNANCY: "Is there any chance you are pregnant?" "When was your last menstrual period?"       N/a  Protocols used: Bee or Yellow Jacket Sting-A-AH

## 2021-01-20 NOTE — Patient Instructions (Addendum)
Continue taking benadryl add Pepcid.

## 2021-01-31 ENCOUNTER — Ambulatory Visit (INDEPENDENT_AMBULATORY_CARE_PROVIDER_SITE_OTHER): Payer: BC Managed Care – PPO | Admitting: Family Medicine

## 2021-01-31 ENCOUNTER — Encounter: Payer: Self-pay | Admitting: Family Medicine

## 2021-01-31 ENCOUNTER — Other Ambulatory Visit: Payer: Self-pay

## 2021-01-31 VITALS — BP 128/74 | HR 67 | Temp 97.9°F | Resp 16 | Ht 65.0 in | Wt 210.1 lb

## 2021-01-31 DIAGNOSIS — E538 Deficiency of other specified B group vitamins: Secondary | ICD-10-CM

## 2021-01-31 DIAGNOSIS — E785 Hyperlipidemia, unspecified: Secondary | ICD-10-CM

## 2021-01-31 DIAGNOSIS — Z0000 Encounter for general adult medical examination without abnormal findings: Secondary | ICD-10-CM | POA: Diagnosis not present

## 2021-01-31 DIAGNOSIS — Z1231 Encounter for screening mammogram for malignant neoplasm of breast: Secondary | ICD-10-CM | POA: Diagnosis not present

## 2021-01-31 DIAGNOSIS — E8881 Metabolic syndrome: Secondary | ICD-10-CM | POA: Diagnosis not present

## 2021-01-31 DIAGNOSIS — Z23 Encounter for immunization: Secondary | ICD-10-CM

## 2021-01-31 MED ORDER — CYANOCOBALAMIN 1000 MCG/ML IJ SOLN
1000.0000 ug | Freq: Once | INTRAMUSCULAR | Status: AC
  Administered 2021-01-31: 1000 ug via INTRAMUSCULAR

## 2021-01-31 NOTE — Patient Instructions (Signed)
Preventive Care 40-54 Years Old, Female Preventive care refers to lifestyle choices and visits with your health care provider that can promote health and wellness. This includes: A yearly physical exam. This is also called an annual wellness visit. Regular dental and eye exams. Immunizations. Screening for certain conditions. Healthy lifestyle choices, such as: Eating a healthy diet. Getting regular exercise. Not using drugs or products that contain nicotine and tobacco. Limiting alcohol use. What can I expect for my preventive care visit? Physical exam Your health care provider will check your: Height and weight. These may be used to calculate your BMI (body mass index). BMI is a measurement that tells if you are at a healthy weight. Heart rate and blood pressure. Body temperature. Skin for abnormal spots. Counseling Your health care provider may ask you questions about your: Past medical problems. Family's medical history. Alcohol, tobacco, and drug use. Emotional well-being. Home life and relationship well-being. Sexual activity. Diet, exercise, and sleep habits. Work and work environment. Access to firearms. Method of birth control. Menstrual cycle. Pregnancy history. What immunizations do I need? Vaccines are usually given at various ages, according to a schedule. Your health care provider will recommend vaccines for you based on your age, medical history, and lifestyle or other factors, such as travel or where you work. What tests do I need? Blood tests Lipid and cholesterol levels. These may be checked every 5 years, or more often if you are over 54 years old. Hepatitis C test. Hepatitis B test. Screening Lung cancer screening. You may have this screening every year starting at age 54 if you have a 30-pack-year history of smoking and currently smoke or have quit within the past 15 years. Colorectal cancer screening. All adults should have this screening starting at  age 50 and continuing until age 75. Your health care provider may recommend screening at age 45 if you are at increased risk. You will have tests every 1-10 years, depending on your results and the type of screening test. Diabetes screening. This is done by checking your blood sugar (glucose) after you have not eaten for a while (fasting). You may have this done every 1-3 years. Mammogram. This may be done every 1-2 years. Talk with your health care provider about when you should start having regular mammograms. This may depend on whether you have a family history of breast cancer. BRCA-related cancer screening. This may be done if you have a family history of breast, ovarian, tubal, or peritoneal cancers. Pelvic exam and Pap test. This may be done every 3 years starting at age 21. Starting at age 54, this may be done every 5 years if you have a Pap test in combination with an HPV test. Other tests STD (sexually transmitted disease) testing, if you are at risk. Bone density scan. This is done to screen for osteoporosis. You may have this scan if you are at high risk for osteoporosis. Talk with your health care provider about your test results, treatment options, and if necessary, the need for more tests. Follow these instructions at home: Eating and drinking  Eat a diet that includes fresh fruits and vegetables, whole grains, lean protein, and low-fat dairy products. Take vitamin and mineral supplements as recommended by your health care provider. Do not drink alcohol if: Your health care provider tells you not to drink. You are pregnant, may be pregnant, or are planning to become pregnant. If you drink alcohol: Limit how much you have to 0-1 drink a day. Be   aware of how much alcohol is in your drink. In the [place] one drink equals one 12 oz bottle of beer (355 mL), one 5 oz glass of wine (148 mL), or one 1 oz glass of hard liquor (44 mL). Lifestyle Take daily care of your teeth and  gums. Brush your teeth every morning and night with fluoride toothpaste. Floss one time each day. Stay active. Exercise for at least 30 minutes 5 or more days each week. Do not use any products that contain nicotine or tobacco, such as cigarettes, e-cigarettes, and chewing tobacco. If you need help quitting, ask your health care provider. Do not use drugs. If you are sexually active, practice safe sex. Use a condom or other form of protection to prevent STIs (sexually transmitted infections). If you do not wish to become pregnant, use a form of birth control. If you plan to become pregnant, see your health care provider for a prepregnancy visit. If told by your health care provider, take low-dose aspirin daily starting at age 54. Find healthy ways to cope with stress, such as: Meditation, yoga, or listening to music. Journaling. Talking to a trusted person. Spending time with friends and family. Safety Always wear your seat belt while driving or riding in a vehicle. Do not drive: If you have been drinking alcohol. Do not ride with someone who has been drinking. When you are tired or distracted. While texting. Wear a helmet and other protective equipment during sports activities. If you have firearms in your house, make sure you follow all gun safety procedures. What's next? Visit your health care provider once a year for an annual wellness visit. Ask your health care provider how often you should have your eyes and teeth checked. Stay up to date on all vaccines. This information is not intended to replace advice given to you by your health care provider. Make sure you discuss any questions you have with your health care provider. Document Revised: 06/28/2020 Document Reviewed: 12/30/2017 Elsevier Patient Education  2022 [employer].

## 2021-01-31 NOTE — Progress Notes (Signed)
Name: Tamara Nelson   MRN: [id number]    DOB: 02/23/1966   Date:01/31/2021       Progress Note  Subjective  Chief Complaint  Annual Exam  HPI  Patient presents for annual CPE.  Diet: discussed calcium intake, she eats enough fiber  Exercise: continue regular activity    [place] Office Visit from 01/29/2020 in [facility]  AUDIT-C Score 0      Depression: Phq 9 is  negative Depression screen [facility] 2/9 01/31/2021 01/31/2021 01/31/2021 01/20/2021 12/02/2020  Decreased Interest 0 0 0 0 0  Down, Depressed, Hopeless 0 0 0 0 0  PHQ - 2 Score 0 0 0 0 0  Altered sleeping 0 - - - -  Tired, decreased energy 0 - - - -  Change in appetite 0 - - - -  Feeling bad or failure about yourself  0 - - - -  Trouble concentrating 0 - - - -  Moving slowly or fidgety/restless 0 - - - -  Suicidal thoughts 0 - - - -  PHQ-9 Score 0 - - - -  Difficult doing work/chores - - - - -  Some recent data might be hidden   Hypertension: BP Readings from Last 3 Encounters:  01/31/21 128/74  01/20/21 130/78  11/08/20 (!) 119/92   Obesity: Wt Readings from Last 3 Encounters:  01/31/21 210 lb 1.6 oz (95.3 kg)  01/20/21 213 lb (96.6 kg)  11/08/20 208 lb (94.3 kg)   BMI Readings from Last 3 Encounters:  01/31/21 34.96 kg/m  01/20/21 35.45 kg/m  11/08/20 34.61 kg/m     Vaccines:   Shingrix: 38-64 yo and ask insurance if covered when patient above 80 yo Pneumonia: educated and discussed with patient. Flu: educated and discussed with patient.  Hep C Screening: 01/23/19 STD testing and prevention (HIV/chl/gon/syphilis): 10/19/14 Intimate partner violence: negative Sexual History : not sexually active  Menstrual History/LMP/Abnormal Bleeding: discussed post menopausal bleeding, she has hot flashes but does not want to take medication  Incontinence Symptoms: no problems   Breast cancer:  - Last Mammogram: 11/13/19 - BRCA gene screening: N/A  Osteoporosis: Discussed high  calcium and vitamin D supplementation, weight bearing exercises  Cervical cancer screening: 12/14/17  Skin cancer: Discussed monitoring for atypical lesions  Colorectal cancer: 11/08/20   Lung cancer: Low Dose CT Chest recommended if Age 47-80 years, 20 pack-year currently smoking OR have quit w/in 15years. Patient does not qualify.   ECG: 06/07/20  Advanced Care Planning: A voluntary discussion about advance care planning including the explanation and discussion of advance directives.  Discussed health care proxy and Living will, and the patient was able to identify a health care proxy as friend, not sure who.    Lipids: Lab Results  Component Value Date   CHOL 184 01/29/2020   CHOL 177 01/23/2019   CHOL 174 12/14/2017   Lab Results  Component Value Date   HDL 46 (L) 01/29/2020   HDL 46 (L) 01/23/2019   HDL 44 (L) 12/14/2017   Lab Results  Component Value Date   LDLCALC 120 (H) 01/29/2020   LDLCALC 116 (H) 01/23/2019   LDLCALC 114 (H) 12/14/2017   Lab Results  Component Value Date   TRIG 83 01/29/2020   TRIG 54 01/23/2019   TRIG 72 12/14/2017   Lab Results  Component Value Date   CHOLHDL 4.0 01/29/2020   CHOLHDL 3.8 01/23/2019   CHOLHDL 4.0 12/14/2017   No results found for: LDLDIRECT  Glucose: Glucose, Bld  Date Value Ref Range Status  06/07/2020 93 65 - 99 mg/dL Final    Comment:    .            Fasting reference interval .   01/29/2020 88 65 - 99 mg/dL Final    Comment:    .            Fasting reference interval .   01/23/2019 91 65 - 99 mg/dL Final    Comment:    .            Fasting reference interval .     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   Abnormal serum level of alkaline phosphatase 10/17/2014   Menopause 10/17/2014   Irritant contact dermatitis 16/02/9603   Dysmetabolic syndrome  54/01/8118   Obesity (BMI 30-39.9) 10/17/2014   Vitamin D deficiency 10/17/2014   Allergic rhinitis, seasonal 10/17/2014    Past Surgical History:  Procedure Laterality Date   COLONOSCOPY WITH PROPOFOL N/A 11/08/2020   Procedure: COLONOSCOPY WITH PROPOFOL;  Surgeon: Lucilla Lame, MD;  Location: [place];  Service: Endoscopy;  Laterality: N/A;    Family History  Problem Relation Age of Onset   Cancer Mother        colon   Cancer Father        Liver and Lung   Stroke Father    Osteoarthritis Sister    Hypertension Brother    Diabetes Brother    Kidney cancer Brother    Diabetes Brother    Vascular Disease Brother    Breast cancer Neg Hx     Social History   Socioeconomic History   Marital status: Single    Spouse name: Not on file   Number of children: 0   Years of education: Not on file   Highest education level: High school graduate  Occupational History   Occupation: Gaffer     Employer: [employer]  Tobacco Use   Smoking status: Never   Smokeless tobacco: Never  Vaping Use   Vaping Use: Never used  Substance and Sexual Activity   Alcohol use: No    Alcohol/week: 0.0 standard drinks   Drug use: No   Sexual activity: Never  Other Topics Concern   Not on file  Social History Narrative   Lives alone, works at [employer] during the day and for [employer] at night, 5 nights weekly    Social Determinants of Radio broadcast assistant Strain: Low Risk    Difficulty of Paying Living Expenses: Not hard at all  Food Insecurity: No Food Insecurity   Worried About Charity fundraiser in the Last Year: Never true   Arboriculturist in the Last Year: Never true  Transportation Needs: No Transportation Needs   Lack of Transportation (Medical): No   Lack of Transportation (Non-Medical): No  Physical Activity: Inactive   Days of Exercise per Week: 0 days   Minutes of Exercise per Session: 0 min  Stress: No Stress Concern  Present   Feeling of Stress : Only a little  Social Connections: Socially Isolated   Frequency of Communication with Friends and Family: Three times a week   Frequency of Social Gatherings with Friends and Family: Once a week   Attends Religious Services: Never   Active  Member of Clubs or Organizations: No   Attends Music therapist: Never   Marital Status: Never married  Human resources officer Violence: Not At Risk   Fear of Current or Ex-Partner: No   Emotionally Abused: No   Physically Abused: No   Sexually Abused: No     Current Outpatient Medications:    ammonium lactate (LAC-HYDRIN) 12 % cream, Apply topically as needed for dry skin., Disp: 385 g, Rfl: 0   azelastine (ASTELIN) 0.1 % nasal spray, Place 1 spray into both nostrils 2 (two) times daily. Use in each nostril as directed, Disp: 30 mL, Rfl: 2   benzonatate (TESSALON) 100 MG capsule, Take 1-2 capsules (100-200 mg total) by mouth 2 (two) times daily as needed., Disp: 40 capsule, Rfl: 0   Cholecalciferol (VITAMIN D) 2000 UNITS CAPS, Take 1 capsule by mouth as needed., Disp: , Rfl:    desonide (DESOWEN) 0.05 % cream, Apply topically 2 (two) times daily., Disp: 30 g, Rfl: 0   EPINEPHrine (EPIPEN 2-PAK) 0.3 mg/0.3 mL IJ SOAJ injection, Inject 0.3 mg into the muscle as needed for anaphylaxis., Disp: 1 each, Rfl: 1   fluticasone (FLONASE) 50 MCG/ACT nasal spray, Place 2 sprays into both nostrils daily., Disp: 48 g, Rfl: 1   losartan (COZAAR) 25 MG tablet, TAKE 1 TABLET (25 MG TOTAL) BY MOUTH DAILY., Disp: 90 tablet, Rfl: 1   montelukast (SINGULAIR) 10 MG tablet, Take 1 tablet (10 mg total) by mouth at bedtime., Disp: 90 tablet, Rfl: 3   Olopatadine HCl 0.2 % SOLN, INSTILL 1 DROP INTO BOTH EYES EVERY MORNING, Disp: 2.5 mL, Rfl: 3   predniSONE (DELTASONE) 10 MG tablet, Taper dose, Day 1 take 6 tablet, day 2 take 5 tablet, day 3 take 4 tablet, day 4 take 2 tablet, day 5 take 1 tablet., Disp: 21 tablet, Rfl: 0   triamcinolone  cream (KENALOG) 0.1 %, Apply topically 2 (two) times daily., Disp: 80 g, Rfl: 1  Allergies  Allergen Reactions   Bee Venom    Mixed Vespid Venom     Other reaction(s): Unknown     ROS  Constitutional: Negative for fever or weight change.  Respiratory: Negative for cough and shortness of breath.   Cardiovascular: Negative for chest pain or palpitations.  Gastrointestinal: Negative for abdominal pain, no bowel changes.  Musculoskeletal: Negative for gait problem or joint swelling.  Skin: Negative for rash.  Neurological: Negative for dizziness or headache.  No other specific complaints in a complete review of systems (except as listed in HPI above).   Objective  Vitals:   01/31/21 1154  BP: 128/74  Pulse: 67  Resp: 16  Temp: 97.9 F (36.6 C)  TempSrc: Oral  SpO2: 97%  Weight: 210 lb 1.6 oz (95.3 kg)  Height: $Remove'5\' 5"'mOLTYOm$  (1.651 m)    Body mass index is 34.96 kg/m.  Physical Exam  Constitutional: Patient appears well-developed and well-nourished. No distress.  HENT: Head: Normocephalic and atraumatic. Ears: B TMs ok, no erythema or effusion; Nose: Nose normal. Mouth/Throat: not done  Eyes: Conjunctivae and EOM are normal. Pupils are equal, round, and reactive to light. No scleral icterus.  Neck: Normal range of motion. Neck supple. No JVD present. No thyromegaly present.  Cardiovascular: Normal rate, regular rhythm and normal heart sounds.  No murmur heard. No BLE edema. Pulmonary/Chest: Effort normal and breath sounds normal. No respiratory distress. Abdominal: Soft. Bowel sounds are normal, no distension. There is no tenderness. no masses Breast: no lumps or masses,  no nipple discharge or rashes FEMALE GENITALIA:  Not done  RECTAL: not done  Musculoskeletal: Normal range of motion, no joint effusions. No gross deformities Neurological: he is alert and oriented to person, place, and time. No cranial nerve deficit. Coordination, balance, strength, speech and gait are  normal.  Skin: Skin is warm and dry. No rash noted. No erythema.  Psychiatric: Patient has a normal mood and affect. behavior is normal. Judgment and thought content normal.   Recent Results (from the past 2160 hour(s))  Novel Coronavirus, NAA ([facility])     Status: Abnormal   Collection Time: 12/02/20 12:00 AM   Specimen: Nasopharyngeal(NP) swabs in vial transport medium   Nasopharynge  Result Value Ref Range   SARS-CoV-2, NAA Detected (A) Not Detected    Comment: Patients who have a positive COVID-19 test result may now have treatment options. Treatment options are available for patients with mild to moderate symptoms and for hospitalized patients. Visit our website at [email or web address] for resources and information. This nucleic acid amplification test was developed and its performance characteristics determined by [employer], [employer]. Nucleic acid amplification tests include RT-PCR and TMA. This test has not been FDA cleared or approved. This test has been authorized by FDA under an Emergency Use Authorization (EUA). This test is only authorized for the duration of time the declaration that circumstances exist justifying the authorization of the emergency use of in vitro diagnostic tests for detection of SARS-CoV-2 virus and/or diagnosis of COVID-19 infection under section 564(b)(1) of the Act, 21 U.S.C. 419QQI-2(L) (1), unless the authorization is terminated or revoked sooner. When diagnostic testing is negativ e, the possibility of a false negative result should be considered in the context of a patient's recent exposures and the presence of clinical signs and symptoms consistent with COVID-19. An individual without symptoms of COVID-19 and who is not shedding SARS-CoV-2 virus would expect to have a negative (not detected) result in this assay.   SARS-COV-2, NAA 2 DAY TAT     Status: None   Collection Time: 12/02/20 12:00 AM   Nasopharynge  Result Value Ref  Range   SARS-CoV-2, NAA 2 DAY TAT Performed      Fall Risk: Fall Risk  01/31/2021 01/31/2021 01/20/2021 12/02/2020 07/23/2020  Falls in the past year? 0 0 0 0 0  Number falls in past yr: - 0 0 0 0  Injury with Fall? - 0 0 0 0  Risk for fall due to : - No Fall Risks No Fall Risks - -  Follow up Falls prevention discussed Falls prevention discussed Falls prevention discussed - -     Functional Status Survey: Is the patient deaf or have difficulty hearing?: No Does the patient have difficulty seeing, even when wearing glasses/contacts?: No Does the patient have difficulty concentrating, remembering, or making decisions?: No Does the patient have difficulty walking or climbing stairs?: No Does the patient have difficulty dressing or bathing?: No Does the patient have difficulty doing errands alone such as visiting a doctor's office or shopping?: No   Assessment & Plan  1. Well adult exam   2. Need for immunization against influenza  - Flu Vaccine QUAD 33mo+IM (Fluarix, Fluzone & Alfiuria Quad PF)  3. Breast cancer screening by mammogram  - MM 3D SCREEN BREAST BILATERAL; Future  4. Dyslipidemia  - Lipid panel  5. B12 deficiency  Today   6. Insulin resistance  - Hemoglobin A1c    -USPSTF grade A and B recommendations reviewed with patient; age-appropriate recommendations,  preventive care, screening tests, etc discussed and encouraged; healthy living encouraged; see AVS for patient education given to patient -Discussed importance of 150 minutes of physical activity weekly, eat two servings of fish weekly, eat one serving of tree nuts ( cashews, pistachios, pecans, almonds.Marland Kitchen) every other day, eat 6 servings of fruit/vegetables daily and drink plenty of water and avoid sweet beverages.

## 2021-02-01 LAB — LIPID PANEL
Cholesterol: 179 mg/dL (ref <200)
HDL: 46 mg/dL — ABNORMAL LOW (ref >=50)
LDL Cholesterol (Calc): 110 mg/dL (calc) — ABNORMAL HIGH
Non-HDL Cholesterol (Calc): 133 mg/dL (calc) — ABNORMAL HIGH (ref <130)
Total CHOL/HDL Ratio: 3.9 (calc) (ref <5.0)
Triglycerides: 119 mg/dL (ref <150)

## 2021-02-01 LAB — HEMOGLOBIN A1C
Hgb A1c MFr Bld: 5.9 % of total Hgb — ABNORMAL HIGH (ref <5.7)
Mean Plasma Glucose: 123 mg/dL
eAG (mmol/L): 6.8 mmol/L

## 2021-04-23 ENCOUNTER — Other Ambulatory Visit: Payer: Self-pay

## 2021-04-23 ENCOUNTER — Ambulatory Visit: Payer: BC Managed Care – PPO | Admitting: Nurse Practitioner

## 2021-04-23 ENCOUNTER — Ambulatory Visit
Admission: RE | Admit: 2021-04-23 | Discharge: 2021-04-23 | Disposition: A | Discharge status: Home / Self Care | Payer: BC Managed Care – PPO | Attending: Pediatrics | Admitting: Pediatrics

## 2021-04-23 ENCOUNTER — Ambulatory Visit
Admission: RE | Admit: 2021-04-23 | Discharge: 2021-04-23 | Disposition: A | Discharge status: Home / Self Care | Payer: BC Managed Care – PPO | Source: Ambulatory Visit | Attending: Nurse Practitioner | Admitting: Nurse Practitioner

## 2021-04-23 ENCOUNTER — Encounter: Payer: Self-pay | Admitting: Nurse Practitioner

## 2021-04-23 VITALS — BP 124/82 | HR 68 | Temp 98.3°F | Resp 18 | Ht 65.0 in | Wt 216.4 lb

## 2021-04-23 DIAGNOSIS — S6991XA Unspecified injury of right wrist, hand and finger(s), initial encounter: Secondary | ICD-10-CM | POA: Diagnosis not present

## 2021-04-23 DIAGNOSIS — S62662A Nondisplaced fracture of distal phalanx of right middle finger, initial encounter for closed fracture: Secondary | ICD-10-CM | POA: Diagnosis not present

## 2021-04-23 NOTE — Progress Notes (Signed)
BP 124/82    Pulse 68    Temp 98.3 F (36.8 C) (Oral)    Resp 18    Ht 5\' 5"  (1.651 m)    Wt 216 lb 6.4 oz (98.2 kg)    SpO2 96%    BMI 36.01 kg/m    Subjective:    Patient ID: , female    DOB: 29-Nov-1965, 55 y.o.   MRN: [id number]  HPI: Tamara Nelson is a 55 y.o. female, here alone  Chief Complaint  Patient presents with   Hand Pain    Finger injury right hand, finger hit by blade of lawnmower   Right middle finger injury: She says yesterday she went to remove some leaves from the lawn mower and the blade hit her finger. There is no laceration.  Finger is swollen and bruised. She says she did have some bleeding under her finger nail.  Tetanus is up to date. Will send for xray.  Discussed getting a finger splint to keep on finger to help with pain.  Ice finger and take tylenol for pain.  She is agreeable to plan.   Relevant past medical, surgical, family and social history reviewed and updated as indicated. Interim medical history since our last visit reviewed. Allergies and medications reviewed and updated.  Review of Systems  Constitutional: Negative for fever or weight change.  Respiratory: Negative for cough and shortness of breath.   Cardiovascular: Negative for chest pain or palpitations.  Gastrointestinal: Negative for abdominal pain, no bowel changes.  Musculoskeletal: Negative for gait problem or joint swelling. Positive for Right middle finger pain, swelling and bruising.  Skin: Negative for rash.  Neurological: Negative for dizziness or headache.  No other specific complaints in a complete review of systems (except as listed in HPI above).      Objective:    BP 124/82    Pulse 68    Temp 98.3 F (36.8 C) (Oral)    Resp 18    Ht 5\' 5"  (1.651 m)    Wt 216 lb 6.4 oz (98.2 kg)    SpO2 96%    BMI 36.01 kg/m   Wt Readings from Last 3 Encounters:  04/23/21 216 lb 6.4 oz (98.2 kg)  01/31/21 210 lb 1.6 oz (95.3 kg)  01/20/21 213 lb (96.6 kg)     Physical Exam Constitutional: Patient appears well-developed and well-nourished. Obese No distress.  HEENT: head atraumatic, normocephalic, pupils equal and reactive to light, neck supple Cardiovascular: Normal rate, regular rhythm and normal heart sounds.  No murmur heard. No BLE edema. Pulmonary/Chest: Effort normal and breath sounds normal. No respiratory distress. Abdominal: Soft.  There is no tenderness. Right middle finger: swelling, contusion noted at tip of finger, no laceration Psychiatric: Patient has a normal mood and affect. behavior is normal. Judgment and thought content normal.   Results for orders placed or performed in visit on 01/31/21  Lipid panel  Result Value Ref Range   Cholesterol 179 <200 mg/dL   HDL 46 (L) > OR = 50 mg/dL   Triglycerides 01/22/21 02/02/21 mg/dL   LDL Cholesterol (Calc) 110 (H) mg/dL (calc)   Total CHOL/HDL Ratio 3.9 <5.0 (calc)   Non-HDL Cholesterol (Calc) 133 (H) <130 mg/dL (calc)  Hemoglobin 244  Result Value Ref Range   Hgb A1c MFr Bld 5.9 (H) <5.7 % of total Hgb   Mean Plasma Glucose 123 mg/dL   eAG (mmol/L) 6.8 mmol/L      Assessment & Plan:  1. Injury of finger of right hand, initial encounter -ice -elevate -finger splint -tylenol for pain - DG Finger Middle Right; Future   Follow up plan: Return if symptoms worsen or fail to improve.

## 2021-04-24 ENCOUNTER — Other Ambulatory Visit: Payer: Self-pay | Admitting: Nurse Practitioner

## 2021-04-24 DIAGNOSIS — S62662A Nondisplaced fracture of distal phalanx of right middle finger, initial encounter for closed fracture: Secondary | ICD-10-CM

## 2021-04-25 DIAGNOSIS — S62662A Nondisplaced fracture of distal phalanx of right middle finger, initial encounter for closed fracture: Secondary | ICD-10-CM | POA: Diagnosis not present

## 2021-05-02 DIAGNOSIS — S62632A Displaced fracture of distal phalanx of right middle finger, initial encounter for closed fracture: Secondary | ICD-10-CM | POA: Diagnosis not present

## 2021-08-26 ENCOUNTER — Encounter: Payer: Self-pay | Admitting: Emergency Medicine

## 2021-08-26 ENCOUNTER — Emergency Department
Admission: EM | Admit: 2021-08-26 | Discharge: 2021-08-26 | Disposition: A | Discharge status: Home / Self Care | Payer: BC Managed Care – PPO | Attending: Emergency Medicine | Admitting: Emergency Medicine

## 2021-08-26 DIAGNOSIS — R42 Dizziness and giddiness: Secondary | ICD-10-CM | POA: Diagnosis not present

## 2021-08-26 DIAGNOSIS — N390 Urinary tract infection, site not specified: Secondary | ICD-10-CM | POA: Diagnosis not present

## 2021-08-26 DIAGNOSIS — R110 Nausea: Secondary | ICD-10-CM | POA: Diagnosis not present

## 2021-08-26 DIAGNOSIS — R112 Nausea with vomiting, unspecified: Secondary | ICD-10-CM

## 2021-08-26 DIAGNOSIS — R1111 Vomiting without nausea: Secondary | ICD-10-CM | POA: Diagnosis not present

## 2021-08-26 LAB — URINALYSIS, ROUTINE W REFLEX MICROSCOPIC
Bilirubin Urine: NEGATIVE
Glucose, UA: 50 mg/dL — AB
Hgb urine dipstick: NEGATIVE
Ketones, ur: 5 mg/dL — AB
Nitrite: NEGATIVE
Protein, ur: NEGATIVE mg/dL
Specific Gravity, Urine: 1.012 (ref 1.005–1.030)
pH: 7 (ref 5.0–8.0)

## 2021-08-26 LAB — COMPREHENSIVE METABOLIC PANEL
ALT: 26 U/L (ref 0–44)
AST: 21 U/L (ref 15–41)
Albumin: 3.9 g/dL (ref 3.5–5.0)
Alkaline Phosphatase: 117 U/L (ref 38–126)
Anion gap: 10 (ref 5–15)
BUN: 17 mg/dL (ref 6–20)
CO2: 22 mmol/L (ref 22–32)
Calcium: 8.9 mg/dL (ref 8.9–10.3)
Chloride: 103 mmol/L (ref 98–111)
Creatinine, Ser: 0.77 mg/dL (ref 0.44–1.00)
GFR, Estimated: >60 mL/min (ref >60)
Glucose, Bld: 194 mg/dL — ABNORMAL HIGH (ref 70–99)
Potassium: 3.1 mmol/L — ABNORMAL LOW (ref 3.5–5.1)
Sodium: 135 mmol/L (ref 135–145)
Total Bilirubin: 0.7 mg/dL (ref 0.3–1.2)
Total Protein: 7.3 g/dL (ref 6.5–8.1)

## 2021-08-26 LAB — CBC
HCT: 39 % (ref 36.0–46.0)
Hemoglobin: 12.9 g/dL (ref 12.0–15.0)
MCH: 28.7 pg (ref 26.0–34.0)
MCHC: 33.1 g/dL (ref 30.0–36.0)
MCV: 86.9 fL (ref 80.0–100.0)
Platelets: 270 10*3/uL (ref 150–400)
RBC: 4.49 MIL/uL (ref 3.87–5.11)
RDW: 13.2 % (ref 11.5–15.5)
WBC: 9.2 10*3/uL (ref 4.0–10.5)
nRBC: 0 % (ref 0.0–0.2)

## 2021-08-26 LAB — MAGNESIUM: Magnesium: 1.8 mg/dL (ref 1.7–2.4)

## 2021-08-26 LAB — LIPASE, BLOOD: Lipase: 33 U/L (ref 11–51)

## 2021-08-26 MED ORDER — PANTOPRAZOLE SODIUM 40 MG IV SOLR
40.0000 mg | Freq: Once | INTRAVENOUS | Status: AC
  Administered 2021-08-26: 40 mg via INTRAVENOUS
  Filled 2021-08-26: qty 10

## 2021-08-26 MED ORDER — ONDANSETRON HCL 4 MG/2ML IJ SOLN
4.0000 mg | Freq: Once | INTRAMUSCULAR | Status: AC
  Administered 2021-08-26: 4 mg via INTRAVENOUS
  Filled 2021-08-26: qty 2

## 2021-08-26 MED ORDER — CEFTRIAXONE SODIUM 1 G IJ SOLR
1.0000 g | Freq: Once | INTRAMUSCULAR | Status: AC
  Administered 2021-08-26: 1 g via INTRAVENOUS
  Filled 2021-08-26: qty 10

## 2021-08-26 MED ORDER — ONDANSETRON 4 MG PO TBDP: 4.0000 mg | ORAL_TABLET | Freq: Four times a day (QID) | ORAL | 0 refills | Status: DC | PRN

## 2021-08-26 MED ORDER — LIDOCAINE VISCOUS HCL 2 % MT SOLN
15.0000 mL | Freq: Once | OROMUCOSAL | Status: AC
  Administered 2021-08-26: 15 mL via ORAL
  Filled 2021-08-26: qty 15

## 2021-08-26 MED ORDER — ONDANSETRON HCL 4 MG/2ML IJ SOLN: 4.0000 mg | Freq: Once | INTRAMUSCULAR | Status: DC

## 2021-08-26 MED ORDER — SODIUM CHLORIDE 0.9 % IV BOLUS (SEPSIS)
1000.0000 mL | Freq: Once | INTRAVENOUS | Status: AC
  Administered 2021-08-26: 1000 mL via INTRAVENOUS

## 2021-08-26 MED ORDER — METOCLOPRAMIDE HCL 5 MG/ML IJ SOLN
10.0000 mg | Freq: Once | INTRAMUSCULAR | Status: AC
  Administered 2021-08-26: 10 mg via INTRAVENOUS
  Filled 2021-08-26: qty 2

## 2021-08-26 MED ORDER — CEPHALEXIN 500 MG PO CAPS: 500.0000 mg | ORAL_CAPSULE | Freq: Two times a day (BID) | ORAL | 0 refills | Status: DC

## 2021-08-26 MED ORDER — ALUM & MAG HYDROXIDE-SIMETH 200-200-20 MG/5ML PO SUSP
30.0000 mL | Freq: Once | ORAL | Status: AC
  Administered 2021-08-26: 30 mL via ORAL
  Filled 2021-08-26: qty 30

## 2021-08-26 MED ORDER — POTASSIUM CHLORIDE CRYS ER 20 MEQ PO TBCR
40.0000 meq | EXTENDED_RELEASE_TABLET | Freq: Once | ORAL | Status: AC
  Administered 2021-08-26: 40 meq via ORAL
  Filled 2021-08-26: qty 2

## 2021-08-26 NOTE — Discharge Instructions (Addendum)
You may alternate Tylenol 1000 mg every 6 hours as needed for pain, fever and Ibuprofen 800 mg every 6-8 hours as needed for pain, fever.  Please take Ibuprofen with food.  Do not take more than 4000 mg of Tylenol (acetaminophen) in a 24 hour period. ? ?You may take over-the-counter Imodium as needed for diarrhea. ? ?I recommend a bland diet for the next several days while you are still having symptoms. ?

## 2021-08-26 NOTE — ED Provider Notes (Signed)
? ?[facility] ?Provider Note ? ? ? Event Date/Time  ? First MD Initiated Contact with Patient 08/26/21 0422   ?  (approximate) ? ? ?History  ? ?Emesis ? ? ?HPI ? ?Tamara Nelson is a 56 y.o. female with history of metabolic syndrome who presents to the emergency department with complaints of nausea, vomiting and diarrhea for the past day.  No fevers, chills, chest pain, shortness of breath.  Has had some burning taste in her abdomen and throat and mouth after vomiting so much.  No dysuria, hematuria, vaginal bleeding or discharge.  No previous abdominal surgeries.  No sick contacts, recent travel, hospitalization or antibiotic use. ? ? ?History provided by patient. ? ? ? ?Past Medical History:  ?Diagnosis Date  ? Alkaline phosphatase raised   ? Allergy   ? Irritant contact dermatitis   ? Metabolic syndrome   ? Vitamin D deficiency   ? ? ?Past Surgical History:  ?Procedure Laterality Date  ? COLONOSCOPY WITH PROPOFOL N/A 11/08/2020  ? Procedure: COLONOSCOPY WITH PROPOFOL;  Surgeon: Midge Minium, MD;  Location: [facility];  Service: Endoscopy;  Laterality: N/A;  ? ? ?MEDICATIONS:  ?Prior to Admission medications   ?Medication Sig Start Date End Date Taking? Authorizing Provider  ?ammonium lactate (LAC-HYDRIN) 12 % cream Apply topically as needed for dry skin. 04/14/19   Alba Cory, MD  ?azelastine (ASTELIN) 0.1 % nasal spray Place 1 spray into both nostrils 2 (two) times daily. Use in each nostril as directed 07/23/20   Alba Cory, MD  ?benzonatate (TESSALON) 100 MG capsule Take 1-2 capsules (100-200 mg total) by mouth 2 (two) times daily as needed. 12/02/20   Alba Cory, MD  ?Cholecalciferol (VITAMIN D) 2000 UNITS CAPS Take 1 capsule by mouth as needed. 06/20/10   [provider]  ?desonide (DESOWEN) 0.05 % cream Apply topically 2 (two) times daily. 01/29/20   Alba Cory, MD  ?EPINEPHrine (EPIPEN 2-PAK) 0.3 mg/0.3 mL IJ SOAJ injection Inject 0.3 mg into the  muscle as needed for anaphylaxis. 07/23/20   Alba Cory, MD  ?losartan (COZAAR) 25 MG tablet TAKE 1 TABLET (25 MG TOTAL) BY MOUTH DAILY. 01/20/21   Berniece Salines, FNP  ?predniSONE (DELTASONE) 10 MG tablet Taper dose, Day 1 take 6 tablet, day 2 take 5 tablet, day 3 take 4 tablet, day 4 take 2 tablet, day 5 take 1 tablet. 01/20/21   Berniece Salines, FNP  ?triamcinolone cream (KENALOG) 0.1 % Apply topically 2 (two) times daily. 12/01/16   Alba Cory, MD  ? ? ?Physical Exam  ? ?Triage Vital Signs: ?ED Triage Vitals  ?Enc Vitals Group  ?   BP 08/26/21 0243 134/79  ?   Pulse Rate 08/26/21 0243 71  ?   Resp 08/26/21 0243 20  ?   Temp 08/26/21 0243 98.3 ?F (36.8 ?C)  ?   Temp Source 08/26/21 0243 Oral  ?   SpO2 08/26/21 0243 98 %  ?   Weight 08/26/21 0242 215 lb (97.5 kg)  ?   Height 08/26/21 0242 5\' 6"  (1.676 m)  ?   Head Circumference --   ?   Peak Flow --   ?   Pain Score 08/26/21 0242 0  ?   Pain Loc --   ?   Pain Edu? --   ?   Excl. in GC? --   ? ? ?Most recent vital signs: ?Vitals:  ? 08/26/21 0417 08/26/21 0530  ?BP: (!) 141/81 (!) 155/82  ?  Pulse: 66 71  ?Resp: 18 19  ?Temp:    ?SpO2: 99% 98%  ? ? ?CONSTITUTIONAL: Alert and oriented and responds appropriately to questions. Well-appearing; well-nourished ?HEAD: Normocephalic, atraumatic ?EYES: Conjunctivae clear, pupils appear equal, sclera nonicteric ?ENT: normal nose; moist mucous membranes ?NECK: Supple, normal ROM ?CARD: RRR; S1 and S2 appreciated; no murmurs, no clicks, no rubs, no gallops ?RESP: Normal chest excursion without splinting or tachypnea; breath sounds clear and equal bilaterally; no wheezes, no rhonchi, no rales, no hypoxia or respiratory distress, speaking full sentences ?ABD/GI: Normal bowel sounds; non-distended; soft, non-tender, no rebound, no guarding, no peritoneal signs ?BACK: The back appears normal ?EXT: Normal ROM in all joints; no deformity noted, no edema; no cyanosis ?SKIN: Normal color for age and race; warm; no rash on exposed  skin ?NEURO: Moves all extremities equally, normal speech ?PSYCH: The patient's mood and manner are appropriate. ? ? ?ED Results / Procedures / Treatments  ? ?LABS: ?(all labs ordered are listed, but only abnormal results are displayed) ?Labs Reviewed  ?COMPREHENSIVE METABOLIC PANEL - Abnormal; Notable for the following components:  ?    Result Value  ? Potassium 3.1 (*)   ? Glucose, Bld 194 (*)   ? All other components within normal limits  ?URINALYSIS, ROUTINE W REFLEX MICROSCOPIC - Abnormal; Notable for the following components:  ? Color, Urine YELLOW (*)   ? APPearance HAZY (*)   ? Glucose, UA 50 (*)   ? Ketones, ur 5 (*)   ? Leukocytes,Ua SMALL (*)   ? Bacteria, UA RARE (*)   ? All other components within normal limits  ?URINE CULTURE  ?LIPASE, BLOOD  ?CBC  ?MAGNESIUM  ? ? ? ?EKG: ? ? ? ? ?RADIOLOGY: ?My personal review and interpretation of imaging:   ? ?I have personally reviewed all radiology reports.   ?No results found. ? ? ?PROCEDURES: ? ?Critical Care performed: No ? ? ? ? ? ?Procedures ? ? ? ?IMPRESSION / MDM / ASSESSMENT AND PLAN / ED COURSE  ?I reviewed the triage vital signs and the nursing notes. ? ? ? ?Patient here with nausea, vomiting, diarrhea.  Abdominal exam is benign. ? ? ? ? ?DIFFERENTIAL DIAGNOSIS (includes but not limited to):   Viral gastroenteritis, gastritis, GERD, hiatal hernia, less likely appendicitis, cholecystitis, pancreatitis, colitis, diverticulitis, bowel obstruction, perforation based on her benign exam ? ? ?PLAN: We will obtain CBC, CMP, lipase, urinalysis.  Will give IV fluids, Zofran, Protonix and GI cocktail. ? ? ?MEDICATIONS GIVEN IN ED: ?Medications  ?metoCLOPramide (REGLAN) injection 10 mg (has no administration in time range)  ?ondansetron [facility](ZOFRAN) injection 4 mg (4 mg Intravenous Given 08/26/21 0253)  ?ondansetron [facility](ZOFRAN) injection 4 mg (4 mg Intravenous Given 08/26/21 0455)  ?sodium chloride 0.9 % bolus 1,000 mL (0 mLs Intravenous Stopped 08/26/21 0536)  ?pantoprazole  (PROTONIX) injection 40 mg (40 mg Intravenous Given 08/26/21 0455)  ?alum & mag hydroxide-simeth (MAALOX/MYLANTA) 200-200-20 MG/5ML suspension 30 mL (30 mLs Oral Given 08/26/21 0455)  ?  And  ?lidocaine (XYLOCAINE) 2 % viscous mouth solution 15 mL (15 mLs Oral Given 08/26/21 0455)  ?potassium chloride SA (KLOR-CON M) CR tablet 40 mEq (40 mEq Oral Given 08/26/21 0455)  ?cefTRIAXone (ROCEPHIN) 1 g in sodium chloride 0.9 % 100 mL IVPB (0 g Intravenous Stopped 08/26/21 0602)  ? ? ? ?ED COURSE: Patient's labs show no leukocytosis.  Normal hemoglobin.  Potassium was slightly low at 3.1.  Will give oral replacement.  Magnesium level normal.  Creatinine, LFTs  and lipase normal.  Urine does show small leukocyte esterase, 21-50,000 white blood cells and rare bacteria with only 0-5 squamous cells.  Will cover with Rocephin for UTI and send urine culture.  Urine only shows small ketones.  She has received a liter of IV fluids here. ? ? ?Patient has been able to tolerate p.o. here.  States she is feeling better but still having some nausea.  Has received 2 rounds of Zofran IV.  Will give Reglan but she feels she is ready for discharge home and has a friend to come pick her up.  Recommended a bland diet over the next several days will discharge with Keflex for her UTI.  Abdominal exam is still benign. ? ? ?At this time, I do not feel there is any life-threatening condition present. I reviewed all nursing notes, vitals, pertinent previous records.  All lab and urine results, EKGs, imaging ordered have been independently reviewed and interpreted by myself.  I reviewed all available radiology reports from any imaging ordered this visit.  Based on my assessment, I feel the patient is safe to be discharged home without further emergent workup and can continue workup as an outpatient as needed. Discussed all findings, treatment plan as well as usual and customary return precautions with patient.  They verbalize understanding and are  comfortable with this plan.  Outpatient follow-up has been provided as needed.  All questions have been answered. ? ? ?CONSULTS: No admission required at this time given benign abdominal exam and patient has

## 2021-08-26 NOTE — ED Triage Notes (Addendum)
Pt presents via EMS with complaints of N/V for the last 3 hours - no meds given PTA. She notes having these sx on last week but it only lasted 1-2 times and this time the feeling is more consistent. Pt actively vomiting in triage. Denies AP, CP, or SOB. ?

## 2021-08-26 NOTE — ED Notes (Signed)
Pt given crackers and water at this time.

## 2021-08-27 LAB — URINE CULTURE: Culture: 10000 — AB

## 2021-08-29 NOTE — Progress Notes (Signed)
Name: Tamara Nelson   MRN: [id number]    DOB: 1965-11-26   Date:09/01/2021 ? ?     Progress Note ? ?Subjective ? ?Chief Complaint ? ?ED Follow Up- Stomach Flu ? ?HPI ? ? ?Gastroenteritis: she developed fatigue Monday April 24 th, the following day she developed a spinning sensation in the bed, she got up and had nausea and vomiting and had a couple of episodes of loose stools. She called 911 and was transported to [facility] by ambulances. She was diagnosed with UTI and gastroenteritis, potassium was down. She is still having some dizziness with head movement but able to drive and went to work today ? ?She states left ear feels full, no fever or chills.  ? ?She finished antibiotic this morning, no dysuria or fever . ? ?Patient Active Problem List  ? Diagnosis Date Noted  ? Diverticulosis 03/17/2018  ? Nephrolithiasis 03/17/2018  ? B12 deficiency 10/23/2015  ? Dyslipidemia 10/21/2015  ? Bee sting allergy 10/17/2014  ? Abnormal serum level of alkaline phosphatase 10/17/2014  ? Menopause 10/17/2014  ? Irritant contact dermatitis 10/17/2014  ? Dysmetabolic syndrome 10/17/2014  ? Obesity (BMI 30-39.9) 10/17/2014  ? Vitamin D deficiency 10/17/2014  ? Allergic rhinitis, seasonal 10/17/2014  ? ? ?Past Surgical History:  ?Procedure Laterality Date  ? COLONOSCOPY WITH PROPOFOL N/A 11/08/2020  ? Procedure: COLONOSCOPY WITH PROPOFOL;  Surgeon: Midge Minium, MD;  Location: [facility];  Service: Endoscopy;  Laterality: N/A;  ? ? ?Family History  ?Problem Relation Age of Onset  ? Cancer Mother   ?     colon  ? Cancer Father   ?     Liver and Lung  ? Stroke Father   ? Osteoarthritis Sister   ? Hypertension Brother   ? Diabetes Brother   ? Kidney cancer Brother   ? Diabetes Brother   ? Vascular Disease Brother   ? Breast cancer Neg Hx   ? ? ?Social History  ? ?Tobacco Use  ? Smoking status: Never  ? Smokeless tobacco: Never  ?Substance Use Topics  ? Alcohol use: No  ?  Alcohol/week: 0.0 standard drinks  ? ? ? ?Current Outpatient  Medications:  ?  ammonium lactate (LAC-HYDRIN) 12 % cream, Apply topically as needed for dry skin., Disp: 385 g, Rfl: 0 ?  azelastine (ASTELIN) 0.1 % nasal spray, Place 1 spray into both nostrils 2 (two) times daily. Use in each nostril as directed, Disp: 30 mL, Rfl: 2 ?  Cholecalciferol (VITAMIN D) 2000 UNITS CAPS, Take 1 capsule by mouth as needed., Disp: , Rfl:  ?  desonide (DESOWEN) 0.05 % cream, Apply topically 2 (two) times daily., Disp: 30 g, Rfl: 0 ?  EPINEPHrine (EPIPEN 2-PAK) 0.3 mg/0.3 mL IJ SOAJ injection, Inject 0.3 mg into the muscle as needed for anaphylaxis., Disp: 1 each, Rfl: 1 ?  losartan (COZAAR) 25 MG tablet, TAKE 1 TABLET (25 MG TOTAL) BY MOUTH DAILY., Disp: 90 tablet, Rfl: 1 ?  triamcinolone cream (KENALOG) 0.1 %, Apply topically 2 (two) times daily., Disp: 80 g, Rfl: 1 ? ?Allergies  ?Allergen Reactions  ? Bee Venom   ? Mixed Vespid Venom   ?  Other reaction(s): Unknown  ? ? ?I personally reviewed active problem list, medication list, allergies, family history, social history, health maintenance with the patient/caregiver today. ? ? ?ROS ? ?Ten systems reviewed and is negative except as mentioned in HPI  ? ?Objective ? ?Vitals:  ? 09/01/21 1136  ?BP: 136/82  ?Pulse: 77  ?  Resp: 16  ?SpO2: 97%  ?Weight: 217 lb (98.4 kg)  ?Height: 5\' 5"  (1.651 m)  ? ? ?Body mass index is 36.11 kg/m?. ? ?Physical Exam ? ?Constitutional: Patient appears well-developed and well-nourished. Obese  No distress.  ?HEENT: head atraumatic, normocephalic, pupils equal and reactive to light, ears air fluid level on left only , neck supple, some erythema / irritation on left soft palate  ?Cardiovascular: Normal rate, regular rhythm and normal heart sounds.  No murmur heard. No BLE edema. ?Pulmonary/Chest: Effort normal and breath sounds normal. No respiratory distress. ?Abdominal: Soft.  There is no tenderness. ?Psychiatric: Patient has a normal mood and affect. behavior is normal. Judgment and thought content normal.   ?Neurological: no nystagmus but felt dizzy when she moved her head, normal reflexes, romberg negative  ? ?Recent Results (from the past 2160 hour(s))  ?Lipase, blood     Status: None  ? Collection Time: 08/26/21  2:54 AM  ?Result Value Ref Range  ? Lipase 33 11 - 51 U/L  ?  Comment: Performed at [facility], [address] [place], [place]  ?Comprehensive metabolic panel     Status: Abnormal  ? Collection Time: 08/26/21  2:54 AM  ?Result Value Ref Range  ? Sodium 135 135 - 145 mmol/L  ? Potassium 3.1 (L) 3.5 - 5.1 mmol/L  ? Chloride 103 98 - 111 mmol/L  ? CO2 22 22 - 32 mmol/L  ? Glucose, Bld 194 (H) 70 - 99 mg/dL  ?  Comment: Glucose reference range applies only to samples taken after fasting for at least 8 hours.  ? BUN 17 6 - 20 mg/dL  ? Creatinine, Ser 0.77 0.44 - 1.00 mg/dL  ? Calcium 8.9 8.9 - 10.3 mg/dL  ? Total Protein 7.3 6.5 - 8.1 g/dL  ? Albumin 3.9 3.5 - 5.0 g/dL  ? AST 21 15 - 41 U/L  ? ALT 26 0 - 44 U/L  ? Alkaline Phosphatase 117 38 - 126 U/L  ? Total Bilirubin 0.7 0.3 - 1.2 mg/dL  ? GFR, Estimated >60 >60 mL/min  ?  Comment: (NOTE) ?Calculated using the CKD-EPI Creatinine Equation (2021) ?  ? Anion gap 10 5 - 15  ?  Comment: Performed at [facility], [address] [place], [place]  ?CBC     Status: None  ? Collection Time: 08/26/21  2:54 AM  ?Result Value Ref Range  ? WBC 9.2 4.0 - 10.5 K/uL  ? RBC 4.49 3.87 - 5.11 MIL/uL  ? Hemoglobin 12.9 12.0 - 15.0 g/dL  ? HCT 39.0 36.0 - 46.0 %  ? MCV 86.9 80.0 - 100.0 fL  ? MCH 28.7 26.0 - 34.0 pg  ? MCHC 33.1 30.0 - 36.0 g/dL  ? RDW 13.2 11.5 - 15.5 %  ? Platelets 270 150 - 400 K/uL  ? nRBC 0.0 0.0 - 0.2 %  ?  Comment: Performed at [facility], [address], [place], [place]  ?Magnesium     Status: None  ? Collection Time: 08/26/21  2:54 AM  ?Result Value Ref Range  ? Magnesium 1.8 1.7 - 2.4 mg/dL  ?  Comment: Performed at [facility], [address], [place], [place]   ?Urinalysis, Routine w reflex microscopic     Status: Abnormal  ? Collection Time: 08/26/21  4:16 AM  ?Result Value Ref Range  ? Color, Urine YELLOW (A) YELLOW  ? APPearance HAZY (A) CLEAR  ? Specific  Gravity, Urine 1.012 1.005 - 1.030  ? pH 7.0 5.0 - 8.0  ? Glucose, UA 50 (A) NEGATIVE mg/dL  ? Hgb urine dipstick NEGATIVE NEGATIVE  ? Bilirubin Urine NEGATIVE NEGATIVE  ? Ketones, ur 5 (A) NEGATIVE mg/dL  ? Protein, ur NEGATIVE NEGATIVE mg/dL  ? Nitrite NEGATIVE NEGATIVE  ? Leukocytes,Ua SMALL (A) NEGATIVE  ? RBC / HPF 0-5 0 - 5 RBC/hpf  ? WBC, UA 21-50 0 - 5 WBC/hpf  ? Bacteria, UA RARE (A) NONE SEEN  ? Squamous Epithelial / LPF 0-5 0 - 5  ? Mucus PRESENT   ?  Comment: Performed at [facility], [address] [place], [place] [address]  ?Urine Culture     Status: Abnormal  ? Collection Time: 08/26/21  4:16 AM  ? Specimen: Urine, Clean Catch  ?Result Value Ref Range  ? Specimen Description    ?  URINE, CLEAN CATCH ?Performed at [facility], [address] [place], [place] [address] ?  ? Special Requests    ?  NONE ?Performed at [facility], [address] [place], [place] [address] ?  ? Culture (A)   ?  10,000 COLONIES/mL VIRIDANS STREPTOCOCCUS ?Standardized susceptibility testing for this organism is not available. ?Performed at [facility], [address] [place], [place] [address] ?  ? Report Status 08/27/2021 FINAL   ? ? ?PHQ2/9: ? ?  09/01/2021  ? 11:36 AM 04/23/2021  ?  8:47 AM 01/31/2021  ? 11:58 AM 01/31/2021  ? 11:56 AM 01/31/2021  ? 11:33 AM  ?Depression screen PHQ 2/9  ?Decreased Interest 0 0 0 0 0  ?Down, Depressed, Hopeless 0 0 0 0 0  ?PHQ - 2 Score 0 0 0 0 0  ?Altered sleeping 0  0    ?Tired, decreased energy 0  0    ?Change in appetite 0  0    ?Feeling bad or failure about yourself  0  0    ?Trouble concentrating 0  0    ?Moving slowly or fidgety/restless 0  0    ?Suicidal thoughts 0  0    ?PHQ-9 Score 0  0    ?  ?phq 9 is negative ? ? ?Fall Risk: ? ?  09/01/2021  ?  11:36 AM 04/23/2021  ?  8:46 AM 01/31/2021  ? 11:58 AM 01/31/2021  ? 11:33 AM 01/20/2021  ?  2:48 PM  ?Fall Risk   ?Falls in the past year? 0 0 0 0 0  ?Number falls in past yr: 0 0  0 0  ?Injury with Fall? 0 0  0

## 2021-09-01 ENCOUNTER — Encounter: Payer: Self-pay | Admitting: Family Medicine

## 2021-09-01 ENCOUNTER — Ambulatory Visit: Payer: BC Managed Care – PPO | Admitting: Family Medicine

## 2021-09-01 VITALS — BP 136/82 | HR 77 | Resp 16 | Ht 65.0 in | Wt 217.0 lb

## 2021-09-01 DIAGNOSIS — N3000 Acute cystitis without hematuria: Secondary | ICD-10-CM | POA: Diagnosis not present

## 2021-09-01 DIAGNOSIS — H9202 Otalgia, left ear: Secondary | ICD-10-CM

## 2021-09-01 DIAGNOSIS — H8112 Benign paroxysmal vertigo, left ear: Secondary | ICD-10-CM | POA: Diagnosis not present

## 2021-09-01 DIAGNOSIS — E876 Hypokalemia: Secondary | ICD-10-CM | POA: Diagnosis not present

## 2021-09-02 LAB — POTASSIUM: Potassium: 4 mmol/L (ref 3.5–5.3)

## 2021-10-28 DIAGNOSIS — H903 Sensorineural hearing loss, bilateral: Secondary | ICD-10-CM | POA: Diagnosis not present

## 2021-10-28 DIAGNOSIS — H9042 Sensorineural hearing loss, unilateral, left ear, with unrestricted hearing on the contralateral side: Secondary | ICD-10-CM | POA: Diagnosis not present

## 2021-10-28 DIAGNOSIS — R42 Dizziness and giddiness: Secondary | ICD-10-CM | POA: Diagnosis not present

## 2021-12-02 ENCOUNTER — Ambulatory Visit: Payer: BC Managed Care – PPO | Admitting: Family Medicine

## 2022-01-02 ENCOUNTER — Encounter: Payer: Self-pay | Admitting: Family Medicine

## 2022-01-02 ENCOUNTER — Ambulatory Visit: Payer: BC Managed Care – PPO | Admitting: Family Medicine

## 2022-01-02 VITALS — BP 132/76 | HR 71 | Resp 16 | Ht 65.0 in | Wt 210.0 lb

## 2022-01-02 DIAGNOSIS — L509 Urticaria, unspecified: Secondary | ICD-10-CM

## 2022-01-02 DIAGNOSIS — L299 Pruritus, unspecified: Secondary | ICD-10-CM | POA: Diagnosis not present

## 2022-01-02 MED ORDER — LORATADINE 10 MG PO TABS: 10.0000 mg | ORAL_TABLET | Freq: Two times a day (BID) | ORAL | 0 refills | Status: DC

## 2022-01-02 MED ORDER — FAMOTIDINE 20 MG PO TABS: 20.0000 mg | ORAL_TABLET | Freq: Two times a day (BID) | ORAL | 0 refills | Status: DC

## 2022-01-02 NOTE — Progress Notes (Signed)
Name: Tamara Nelson   MRN: [id number]    DOB: Sep 07, 1965   Date:01/02/2022       Progress Note  Subjective  Chief Complaint  Rash  HPI  Hives and itching: she states symptoms started 5 days ago, she initially had a erythematous rash, very itchy, the rash resolved since last night, it was spreading to different parts, under breasts both legs and arms. She took Benadryl last night and was able to sleep. Rash feels itchy and burning like. Worse when exposed to the sun. She states she worked in her yard over the weekend. The palm of her hands gets red at times. Today she is just itchy every where, did not take any new medications, no new hygiene products, no change in her laundry detergents or anything in her house.   She states when she eats something red she has a rash but different than this episode   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   Abnormal serum level of alkaline phosphatase 10/17/2014   Menopause 10/17/2014   Irritant contact dermatitis 10/17/2014   Dysmetabolic syndrome 10/17/2014   Obesity (BMI 30-39.9) 10/17/2014   Vitamin D deficiency 10/17/2014   Allergic rhinitis, seasonal 10/17/2014    Past Surgical History:  Procedure Laterality Date   COLONOSCOPY WITH PROPOFOL N/A 11/08/2020   Procedure: COLONOSCOPY WITH PROPOFOL;  Surgeon: Midge Minium, MD;  Location: [facility];  Service: Endoscopy;  Laterality: N/A;    Family History  Problem Relation Age of Onset   Cancer Mother        colon   Cancer Father        Liver and Lung   Stroke Father    Osteoarthritis Sister    Hypertension Brother    Diabetes Brother    Kidney cancer Brother    Diabetes Brother    Vascular Disease Brother    Breast cancer Neg Hx     Social History   Tobacco Use   Smoking status: Never   Smokeless tobacco: Never   Substance Use Topics   Alcohol use: No    Alcohol/week: 0.0 standard drinks of alcohol     Current Outpatient Medications:    ammonium lactate (LAC-HYDRIN) 12 % cream, Apply topically as needed for dry skin., Disp: 385 g, Rfl: 0   azelastine (ASTELIN) 0.1 % nasal spray, Place 1 spray into both nostrils 2 (two) times daily. Use in each nostril as directed, Disp: 30 mL, Rfl: 2   Cholecalciferol (VITAMIN D) 2000 UNITS CAPS, Take 1 capsule by mouth as needed., Disp: , Rfl:    desonide (DESOWEN) 0.05 % cream, Apply topically 2 (two) times daily., Disp: 30 g, Rfl: 0   EPINEPHrine (EPIPEN 2-PAK) 0.3 mg/0.3 mL IJ SOAJ injection, Inject 0.3 mg into the muscle as needed for anaphylaxis., Disp: 1 each, Rfl: 1   losartan (COZAAR) 25 MG tablet, TAKE 1 TABLET (25 MG TOTAL) BY MOUTH DAILY., Disp: 90 tablet, Rfl: 1   triamcinolone cream (KENALOG) 0.1 %, Apply topically 2 (two) times daily., Disp: 80 g, Rfl: 1  Allergies  Allergen Reactions   Bee Venom    Mixed Vespid Venom     Other reaction(s): Unknown    I personally reviewed active problem list, medication list, allergies, family history, social history, health maintenance with the patient/caregiver today.   ROS  Ten systems  reviewed and is negative except as mentioned in HPI   Objective  Vitals:   01/02/22 1039  BP: 132/76  Pulse: 71  Resp: 16  SpO2: 98%  Weight: 210 lb (95.3 kg)  Height: 5\' 5"  (1.651 m)    Body mass index is 34.95 kg/m.  Physical Exam  Constitutional: Patient appears well-developed and well-nourished. Obese  No distress.  HEENT: head atraumatic, normocephalic, pupils equal and reactive to light, neck supple Cardiovascular: Normal rate, regular rhythm and normal heart sounds.  No murmur heard. No BLE edema. Pulmonary/Chest: Effort normal and breath sounds normal. No respiratory distress. Abdominal: Soft.  There is no tenderness. Psychiatric: Patient has a normal mood and affect. behavior is normal. Judgment  and thought content normal.  Skin: no rashes, but she was scratching herself   PHQ2/9:    01/02/2022   10:39 AM 09/01/2021   11:36 AM 04/23/2021    8:47 AM 01/31/2021   11:58 AM 01/31/2021   11:56 AM  Depression screen PHQ 2/9  Decreased Interest 0 0 0 0 0  Down, Depressed, Hopeless 0 0 0 0 0  PHQ - 2 Score 0 0 0 0 0  Altered sleeping 0 0  0   Tired, decreased energy 0 0  0   Change in appetite 0 0  0   Feeling bad or failure about yourself  0 0  0   Trouble concentrating 0 0  0   Moving slowly or fidgety/restless 0 0  0   Suicidal thoughts 0 0  0   PHQ-9 Score 0 0  0     phq 9 is negative   Fall Risk:    01/02/2022   10:39 AM 09/01/2021   11:36 AM 04/23/2021    8:46 AM 01/31/2021   11:58 AM 01/31/2021   11:33 AM  Fall Risk   Falls in the past year? 0 0 0 0 0  Number falls in past yr: 0 0 0  0  Injury with Fall? 0 0 0  0  Risk for fall due to : No Fall Risks No Fall Risks   No Fall Risks  Follow up Falls prevention discussed Falls prevention discussed Falls evaluation completed Falls prevention discussed Falls prevention discussed      Functional Status Survey: Is the patient deaf or have difficulty hearing?: No Does the patient have difficulty seeing, even when wearing glasses/contacts?: No Does the patient have difficulty concentrating, remembering, or making decisions?: No Does the patient have difficulty walking or climbing stairs?: No Does the patient have difficulty dressing or bathing?: No Does the patient have difficulty doing errands alone such as visiting a doctor's office or shopping?: No    Assessment & Plan  1. Hives  - Ambulatory referral to Allergy - famotidine (PEPCID) 20 MG tablet; Take 1 tablet (20 mg total) by mouth 2 (two) times daily.  Dispense: 60 tablet; Refill: 0 - loratadine (CLARITIN) 10 MG tablet; Take 1 tablet (10 mg total) by mouth 2 (two) times daily.  Dispense: 60 tablet; Refill: 0  2. Pruritus  - Ambulatory referral to Allergy -  famotidine (PEPCID) 20 MG tablet; Take 1 tablet (20 mg total) by mouth 2 (two) times daily.  Dispense: 60 tablet; Refill: 0 - loratadine (CLARITIN) 10 MG tablet; Take 1 tablet (10 mg total) by mouth 2 (two) times daily.  Dispense: 60 tablet; Refill: 0

## 2022-01-24 ENCOUNTER — Other Ambulatory Visit: Payer: Self-pay | Admitting: Family Medicine

## 2022-01-24 DIAGNOSIS — L299 Pruritus, unspecified: Secondary | ICD-10-CM

## 2022-01-24 DIAGNOSIS — L509 Urticaria, unspecified: Secondary | ICD-10-CM

## 2022-02-05 NOTE — Patient Instructions (Addendum)
Sciatica  Sciatica is pain, numbness, weakness, or tingling along the path of the sciatic nerve. The sciatic nerve starts in the lower back and runs down the back of each leg. The nerve controls the muscles in the lower leg and in the back of the knee. It also provides feeling (sensation) to the back of the thigh, the lower leg, and the sole of the foot. Sciatica is a symptom of another medical condition that pinches or puts pressure on the sciatic nerve. Sciatica most often only affects one side of the body. Sciatica usually goes away on its own or with treatment. In some cases, sciatica may come back (recur). What are the causes? This condition is caused by pressure on the sciatic nerve or pinching of the nerve. This may be the result of: A disk in between the bones of the spine bulging out too far (herniated disk). Age-related changes in the spinal disks. A pain disorder that affects a muscle in the buttock. Extra bone growth near the sciatic nerve. A break (fracture) of the pelvis. Pregnancy. Tumor. This is rare. What increases the risk? The following factors may make you more likely to develop this condition: Playing sports that place pressure or stress on the spine. Having poor strength and flexibility. A history of back injury or surgery. Sitting for long periods of time. Doing activities that involve repetitive bending or lifting. Obesity. What are the signs or symptoms? Symptoms can vary from mild to very severe. They may include: Any of the following problems in the lower back, leg, hip, or buttock: Mild tingling, numbness, or dull aches. Burning sensations. Sharp pains. Numbness in the back of the calf or the sole of the foot. Leg weakness. Severe back pain that makes movement difficult. Symptoms may get worse when you cough, sneeze, or laugh, or when you sit or stand for long periods of time. How is this diagnosed? This condition may be diagnosed based on: Your symptoms  and medical history. A physical exam. Blood tests. Imaging tests, such as: X-rays. An MRI. A CT scan. How is this treated? In many cases, this condition improves on its own without treatment. However, treatment may include: Reducing or modifying physical activity. Exercising, including strengthening and stretching. Icing and applying heat to the affected area. Medicines that help to: Relieve pain and swelling. Relax your muscles. Injections of medicines that help to relieve pain and inflammation (steroids) around the sciatic nerve. Surgery. Follow these instructions at home: Medicines Take over-the-counter and prescription medicines only as told by your health care provider. Ask your health care provider if the medicine prescribed to you requires you to avoid driving or using heavy machinery. Managing pain     If directed, put ice on the affected area. To do this: Put ice in a plastic bag. Place a towel between your skin and the bag. Leave the ice on for 20 minutes, 2-3 times a day. If your skin turns bright red, remove the ice right away to prevent skin damage. The risk of skin damage is higher if you cannot feel pain, heat, or cold. If directed, apply heat to the affected area as often as told by your health care provider. Use the heat source that your health care provider recommends, such as a moist heat pack or a heating pad. Place a towel between your skin and the heat source. Leave the heat on for 20-30 minutes. If your skin turns bright red, remove the heat right away to prevent burns. The   risk of burns is higher if you cannot feel pain, heat, or cold. Activity  Return to your normal activities as told by your health care provider. Ask your health care provider what activities are safe for you. Avoid activities that make your symptoms worse. Take brief periods of rest throughout the day. When you rest for longer periods, mix in some mild activity or stretching between  periods of rest. This will help to prevent stiffness and pain. Avoid sitting for long periods of time without moving. Get up and move around at least one time each hour. Exercise and stretch regularly as told by your health care provider. Do not lift anything that is heavier than 10 lb (4.5 kg) until your health care provider says that it is safe. When you do not have symptoms, you should still avoid heavy lifting, especially repetitive heavy lifting. When you lift objects, always use proper lifting technique, which includes: Bending your knees. Keeping the load close to your body. Avoiding twisting. General instructions Maintain a healthy weight. Excess weight puts extra stress on your back. Wear supportive, comfortable shoes. Avoid wearing high heels. Avoid sleeping on a mattress that is too soft or too hard. A mattress that is firm enough to support your back when you sleep may help to reduce your pain. Contact a health care provider if: Your pain is not controlled by medicine. Your pain does not improve or gets worse. Your pain lasts longer than 4 weeks. You have unexplained weight loss. Get help right away if: You are not able to control when you urinate or have bowel movements (incontinence). You have: Weakness in your lower back, pelvis, buttocks, or legs that gets worse. Redness or swelling of your back. A burning sensation when you urinate. Summary Sciatica is pain, numbness, weakness, or tingling along the path of the sciatic nerve, which may include the lower back, legs, hips, and buttocks. This condition is caused by pressure on the sciatic nerve or pinching of the nerve. Treatment often includes rest, exercise, medicines, and applying ice or heat. This information is not intended to replace advice given to you by your health care provider. Make sure you discuss any questions you have with your health care provider. Document Revised: 07/28/2021 Document Reviewed:  07/28/2021 Elsevier Patient Education  2023 Elsevier Inc.  

## 2022-02-05 NOTE — Progress Notes (Signed)
Name: Tamara Nelson   MRN: [id number]    DOB: January 13, 1966   Date:02/06/2022       Progress Note  Subjective  Chief Complaint  Chief Complaint  Patient presents with   Follow-up    HPI  HTN: bp is towards low end of normal, taking half pill of losartan 25 mg daily , but skips doses, bp today is at goal . She denies side effects of medications. No chest pain, palpitation or SOB   Rhus Dermatitis: she was trimming trees on Saturday and two days later deveoped a blister on left wrist, it is now spread on both arms, very itchy, red base and vesicles with linear manifestation.    B12 and Vitamin D deficiency:she takes supplements occasionally.    Sciatica: she noticed burning and shooting pain that goes from left outer hip to bottom of left foot, she states worse when walking for a long time and in the mornings. No bowel or bladder incontinence. No muscle weakness.   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  The 10-year ASCVD risk score (Arnett DK, et al., 2019) is: 2.6%   Values used to calculate the score:     Age: 56 years     Sex: Female     Is Non-Hispanic African American: No     Diabetic: No     Tobacco smoker: No     Systolic Blood Pressure: 122 mmHg     Is BP treated: Yes     HDL Cholesterol: 46 mg/dL     Total Cholesterol: 179 mg/dL   Pre-diabetes: discussed importance of low carb diet, still drinking [employer], she drinks about 2 cans per day   Perennial AR with seasonal variation: worse in the Fall, takes otc medication and flonase prn. 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   Abnormal serum level of alkaline phosphatase 10/17/2014   Menopause 10/17/2014   Irritant contact dermatitis 10/17/2014    Dysmetabolic syndrome 10/17/2014   Obesity (BMI 30-39.9) 10/17/2014   Vitamin D deficiency 10/17/2014   Allergic rhinitis, seasonal 10/17/2014    Past Surgical History:  Procedure Laterality Date   COLONOSCOPY WITH PROPOFOL N/A 11/08/2020   Procedure: COLONOSCOPY WITH PROPOFOL;  Surgeon: Midge Minium, MD;  Location: [facility];  Service: Endoscopy;  Laterality: N/A;    Family History  Problem Relation Age of Onset   Cancer Mother        colon   Cancer Father        Liver and Lung   Stroke Father    Osteoarthritis Sister    Hypertension Brother    Diabetes Brother    Kidney cancer Brother    Diabetes Brother    Vascular Disease Brother    Breast cancer Neg Hx     Social History   Tobacco Use   Smoking status: Never   Smokeless tobacco: Never  Substance Use Topics   Alcohol use: No    Alcohol/week: 0.0 standard drinks of alcohol     Current Outpatient Medications:    ammonium lactate (LAC-HYDRIN) 12 % cream, Apply topically as needed for dry skin., Disp: 385 g, Rfl: 0   azelastine (ASTELIN) 0.1 % nasal spray, Place 1 spray into both  nostrils 2 (two) times daily. Use in each nostril as directed, Disp: 30 mL, Rfl: 2   Cholecalciferol (VITAMIN D) 2000 UNITS CAPS, Take 1 capsule by mouth as needed., Disp: , Rfl:    desonide (DESOWEN) 0.05 % cream, Apply topically 2 (two) times daily., Disp: 30 g, Rfl: 0   EPINEPHrine (EPIPEN 2-PAK) 0.3 mg/0.3 mL IJ SOAJ injection, Inject 0.3 mg into the muscle as needed for anaphylaxis., Disp: 1 each, Rfl: 1   famotidine (PEPCID) 20 MG tablet, TAKE 1 TABLET BY MOUTH TWICE A DAY, Disp: 60 tablet, Rfl: 0   loratadine (CLARITIN) 10 MG tablet, TAKE 1 TABLET BY MOUTH TWICE A DAY, Disp: 60 tablet, Rfl: 0   losartan (COZAAR) 25 MG tablet, TAKE 1 TABLET (25 MG TOTAL) BY MOUTH DAILY., Disp: 90 tablet, Rfl: 1   triamcinolone cream (KENALOG) 0.1 %, Apply topically 2 (two) times daily., Disp: 80 g, Rfl: 1  Allergies  Allergen Reactions    Bee Venom    Mixed Vespid Venom     Other reaction(s): Unknown    I personally reviewed active problem list, medication list, allergies, family history, social history, health maintenance with the patient/caregiver today.   ROS  Constitutional: Negative for fever or weight change.  Respiratory: Negative for cough and shortness of breath.   Cardiovascular: Negative for chest pain or palpitations.  Gastrointestinal: Negative for abdominal pain, no bowel changes.  Musculoskeletal: Negative for gait problem or joint swelling.  Skin: positive for rash.  Neurological: Negative for dizziness or headache.  No other specific complaints in a complete review of systems (except as listed in HPI above).   Objective  Vitals:   02/06/22 1505  BP: 122/82  Pulse: 72  Resp: 16  SpO2: 96%  Weight: 207 lb (93.9 kg)  Height: 5\' 5"  (1.651 m)    Body mass index is 34.45 kg/m.  Physical Exam  Constitutional: Patient appears well-developed and well-nourished. Obese  No distress.  HEENT: head atraumatic, normocephalic, pupils equal and reactive to light, neck supple Cardiovascular: Normal rate, regular rhythm and normal heart sounds.  No murmur heard. No BLE edema. Pulmonary/Chest: Effort normal and breath sounds normal. No respiratory distress. Abdominal: Soft.  There is no tenderness. Skin: see attached pick, linear erythematous rash on both arms with vesicular formation some areas in clusters Muscular Skeletal: no pain during palpation of lumbar spine, negative straight leg raise Psychiatric: Patient has a normal mood and affect. behavior is normal. Judgment and thought content normal.    PHQ2/9:    02/06/2022    2:56 PM 01/02/2022   10:39 AM 09/01/2021   11:36 AM 04/23/2021    8:47 AM 01/31/2021   11:58 AM  Depression screen PHQ 2/9  Decreased Interest 0 0 0 0 0  Down, Depressed, Hopeless 0 0 0 0 0  PHQ - 2 Score 0 0 0 0 0  Altered sleeping 0 0 0  0  Tired, decreased energy 0 0 0  0   Change in appetite 0 0 0  0  Feeling bad or failure about yourself  0 0 0  0  Trouble concentrating 0 0 0  0  Moving slowly or fidgety/restless 0 0 0  0  Suicidal thoughts 0 0 0  0  PHQ-9 Score 0 0 0  0    phq 9 is negative   Fall Risk:    02/06/2022    2:56 PM 01/02/2022   10:39 AM 09/01/2021   11:36 AM 04/23/2021  8:46 AM 01/31/2021   11:58 AM  Fall Risk   Falls in the past year? 0 0 0 0 0  Number falls in past yr: 0 0 0 0   Injury with Fall? 0 0 0 0   Risk for fall due to : No Fall Risks No Fall Risks No Fall Risks    Follow up Falls prevention discussed Falls prevention discussed Falls prevention discussed Falls evaluation completed Falls prevention discussed      Functional Status Survey: Is the patient deaf or have difficulty hearing?: No Does the patient have difficulty seeing, even when wearing glasses/contacts?: No Does the patient have difficulty concentrating, remembering, or making decisions?: No Does the patient have difficulty walking or climbing stairs?: No Does the patient have difficulty dressing or bathing?: No Does the patient have difficulty doing errands alone such as visiting a doctor's office or shopping?: No    Assessment & Plan   1. Rhus dermatitis  - predniSONE (DELTASONE) 10 MG tablet; Take 1 tablet (10 mg total) by mouth daily with breakfast. 6x2 days, 5 x 2days,4x2days,3x2day,2x2days and 1x2days  Dispense: 42 tablet; Refill: 0  2. Breast cancer screening by mammogram  - MM 3D SCREEN BREAST BILATERAL; Future  3. Dysmetabolic syndrome  - Hemoglobin A1c  4. Hypertension, benign  - COMPLETE METABOLIC PANEL WITH GFR - losartan (COZAAR) 25 MG tablet; Take 0.5 tablets (12.5 mg total) by mouth daily.  Dispense: 45 tablet; Refill: 1  5. B12 deficiency  - B12 and Folate Panel - CBC with Differential/Platelet  6. Dyslipidemia  - Lipid panel  7. Vitamin D deficiency  - VITAMIN D 25 Hydroxy (Vit-D Deficiency, Fractures)  8.  Perennial allergic rhinitis with seasonal variation   9. Bee allergy status  - EPINEPHrine (EPIPEN 2-PAK) 0.3 mg/0.3 mL IJ SOAJ injection; Inject 0.3 mg into the muscle as needed for anaphylaxis.  Dispense: 1 each; Refill: 1  10. Sciatica of left side  Prednisone taper should work for that also  11. Rash  - triamcinolone cream (KENALOG) 0.1 %; Apply topically 2 (two) times daily.  Dispense: 80 g; Refill: 1

## 2022-02-06 ENCOUNTER — Encounter: Payer: Self-pay | Admitting: Family Medicine

## 2022-02-06 ENCOUNTER — Ambulatory Visit (INDEPENDENT_AMBULATORY_CARE_PROVIDER_SITE_OTHER): Payer: BC Managed Care – PPO | Admitting: Family Medicine

## 2022-02-06 VITALS — BP 122/82 | HR 72 | Resp 16 | Ht 65.0 in | Wt 207.0 lb

## 2022-02-06 DIAGNOSIS — Z0000 Encounter for general adult medical examination without abnormal findings: Secondary | ICD-10-CM

## 2022-02-06 DIAGNOSIS — L255 Unspecified contact dermatitis due to plants, except food: Secondary | ICD-10-CM

## 2022-02-06 DIAGNOSIS — Z1231 Encounter for screening mammogram for malignant neoplasm of breast: Secondary | ICD-10-CM

## 2022-02-06 DIAGNOSIS — E538 Deficiency of other specified B group vitamins: Secondary | ICD-10-CM | POA: Diagnosis not present

## 2022-02-06 DIAGNOSIS — I10 Essential (primary) hypertension: Secondary | ICD-10-CM | POA: Diagnosis not present

## 2022-02-06 DIAGNOSIS — E559 Vitamin D deficiency, unspecified: Secondary | ICD-10-CM | POA: Diagnosis not present

## 2022-02-06 DIAGNOSIS — J302 Other seasonal allergic rhinitis: Secondary | ICD-10-CM | POA: Insufficient documentation

## 2022-02-06 DIAGNOSIS — E8881 Metabolic syndrome: Secondary | ICD-10-CM

## 2022-02-06 DIAGNOSIS — E785 Hyperlipidemia, unspecified: Secondary | ICD-10-CM | POA: Diagnosis not present

## 2022-02-06 DIAGNOSIS — M5432 Sciatica, left side: Secondary | ICD-10-CM

## 2022-02-06 DIAGNOSIS — R21 Rash and other nonspecific skin eruption: Secondary | ICD-10-CM

## 2022-02-06 DIAGNOSIS — J3089 Other allergic rhinitis: Secondary | ICD-10-CM

## 2022-02-06 DIAGNOSIS — Z91030 Bee allergy status: Secondary | ICD-10-CM

## 2022-02-06 MED ORDER — LOSARTAN POTASSIUM 25 MG PO TABS: 12.5000 mg | ORAL_TABLET | Freq: Every day | ORAL | 1 refills | Status: DC

## 2022-02-06 MED ORDER — LOSARTAN POTASSIUM 25 MG PO TABS: 25.0000 mg | ORAL_TABLET | Freq: Every day | ORAL | 1 refills | Status: DC

## 2022-02-06 MED ORDER — PREDNISONE 10 MG PO TABS: 10.0000 mg | ORAL_TABLET | Freq: Every day | ORAL | 0 refills | Status: DC

## 2022-02-06 MED ORDER — EPINEPHRINE 0.3 MG/0.3ML IJ SOAJ: 0.3000 mg | INTRAMUSCULAR | 1 refills | Status: DC | PRN

## 2022-02-06 MED ORDER — TRIAMCINOLONE ACETONIDE 0.1 % EX CREA: TOPICAL_CREAM | Freq: Two times a day (BID) | CUTANEOUS | 1 refills | Status: DC

## 2022-02-07 LAB — COMPLETE METABOLIC PANEL WITH GFR
AG Ratio: 1.4 (calc) (ref 1.0–2.5)
ALT: 20 U/L (ref 6–29)
AST: 17 U/L (ref 10–35)
Albumin: 4.4 g/dL (ref 3.6–5.1)
Alkaline phosphatase (APISO): 134 U/L (ref 37–153)
BUN: 11 mg/dL (ref 7–25)
CO2: 23 mmol/L (ref 20–32)
Calcium: 9.8 mg/dL (ref 8.6–10.4)
Chloride: 103 mmol/L (ref 98–110)
Creat: 0.85 mg/dL (ref 0.50–1.03)
Globulin: 3.1 g/dL (calc) (ref 1.9–3.7)
Glucose, Bld: 96 mg/dL (ref 65–99)
Potassium: 4 mmol/L (ref 3.5–5.3)
Sodium: 137 mmol/L (ref 135–146)
Total Bilirubin: 0.5 mg/dL (ref 0.2–1.2)
Total Protein: 7.5 g/dL (ref 6.1–8.1)
eGFR: 81 mL/min/{1.73_m2} (ref >=60)

## 2022-02-07 LAB — CBC WITH DIFFERENTIAL/PLATELET
Absolute Monocytes: 537 cells/uL (ref 200–950)
Basophils Absolute: 44 cells/uL (ref 0–200)
Basophils Relative: 0.5 %
Eosinophils Absolute: 88 cells/uL (ref 15–500)
Eosinophils Relative: 1 %
HCT: 39.9 % (ref 35.0–45.0)
Hemoglobin: 13.6 g/dL (ref 11.7–15.5)
Lymphs Abs: 3010 cells/uL (ref 850–3900)
MCH: 29.6 pg (ref 27.0–33.0)
MCHC: 34.1 g/dL (ref 32.0–36.0)
MCV: 86.9 fL (ref 80.0–100.0)
MPV: 9.9 fL (ref 7.5–12.5)
Monocytes Relative: 6.1 %
Neutro Abs: 5122 cells/uL (ref 1500–7800)
Neutrophils Relative %: 58.2 %
Platelets: 303 10*3/uL (ref 140–400)
RBC: 4.59 10*6/uL (ref 3.80–5.10)
RDW: 13.8 % (ref 11.0–15.0)
Total Lymphocyte: 34.2 %
WBC: 8.8 10*3/uL (ref 3.8–10.8)

## 2022-02-07 LAB — B12 AND FOLATE PANEL
Folate: 20.4 ng/mL
Vitamin B-12: 653 pg/mL (ref 200–1100)

## 2022-02-07 LAB — HEMOGLOBIN A1C
Hgb A1c MFr Bld: 5.9 % of total Hgb — ABNORMAL HIGH (ref <5.7)
Mean Plasma Glucose: 123 mg/dL
eAG (mmol/L): 6.8 mmol/L

## 2022-02-07 LAB — LIPID PANEL
Cholesterol: 199 mg/dL (ref <200)
HDL: 41 mg/dL — ABNORMAL LOW (ref >=50)
LDL Cholesterol (Calc): 138 mg/dL (calc) — ABNORMAL HIGH
Non-HDL Cholesterol (Calc): 158 mg/dL (calc) — ABNORMAL HIGH (ref <130)
Total CHOL/HDL Ratio: 4.9 (calc) (ref <5.0)
Triglycerides: 102 mg/dL (ref <150)

## 2022-02-07 LAB — VITAMIN D 25 HYDROXY (VIT D DEFICIENCY, FRACTURES): Vit D, 25-Hydroxy: 27 ng/mL — ABNORMAL LOW (ref 30–100)

## 2022-02-28 ENCOUNTER — Other Ambulatory Visit: Payer: Self-pay | Admitting: Family Medicine

## 2022-02-28 DIAGNOSIS — L299 Pruritus, unspecified: Secondary | ICD-10-CM

## 2022-02-28 DIAGNOSIS — L509 Urticaria, unspecified: Secondary | ICD-10-CM

## 2022-03-02 ENCOUNTER — Ambulatory Visit: Payer: Self-pay | Admitting: Family Medicine

## 2022-03-02 ENCOUNTER — Encounter: Payer: Self-pay | Admitting: Family Medicine

## 2022-03-02 DIAGNOSIS — R58 Hemorrhage, not elsewhere classified: Secondary | ICD-10-CM

## 2022-03-02 NOTE — Addendum Note (Signed)
Addended by: Steele Sizer F on: 03/02/2022 02:55 PM   Modules accepted: Orders
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
## 2022-03-13 NOTE — Progress Notes (Unsigned)
Name: Tamara Nelson   MRN: [id number]    DOB: [birth year]   Date:03/16/2022       Progress Note  Subjective  Chief Complaint  Annual Exam  HPI  Patient presents for annual CPE.  The 10-year ASCVD risk score (Arnett DK, et al., 2019) is: 3.1%   Values used to calculate the score:     Age: 56 years     Sex: Female     Is Non-Hispanic African American: No     Diabetic: No     Tobacco smoker: No     Systolic Blood Pressure: 413 mmHg     Is BP treated: Yes     HDL Cholesterol: 41 mg/dL     Total Cholesterol: 199 mg/dL   Diet: she cooks at home, usually eggs for breakfast, salad for lunch, and light dinners - sometimes a sandwich  Exercise: she is active in her yard and helps with neighbors yards but usually once a week for a few hours. Discussed walking the other days of the week.   Last Eye Exam: due for an eye exam Last Dental Exam: every 6 months   [place] Office Visit from 04/23/2021 in [facility]  AUDIT-C Score 0      Depression: Phq 9 is  negative    03/16/2022    2:30 PM 03/02/2022    2:30 PM 02/06/2022    2:56 PM 01/02/2022   10:39 AM 09/01/2021   11:36 AM  Depression screen PHQ 2/9  Decreased Interest 0 0 0 0 0  Down, Depressed, Hopeless 0 0 0 0 0  PHQ - 2 Score 0 0 0 0 0  Altered sleeping 0 0 0 0 0  Tired, decreased energy 0 0 0 0 0  Change in appetite 0 0 0 0 0  Feeling bad or failure about yourself  0 0 0 0 0  Trouble concentrating 0 0 0 0 0  Moving slowly or fidgety/restless 0 0 0 0 0  Suicidal thoughts 0 0 0 0 0  PHQ-9 Score 0 0 0 0 0   Hypertension: BP Readings from Last 3 Encounters:  03/16/22 120/84  03/02/22 122/76  02/06/22 122/82   Obesity: Wt Readings from Last 3 Encounters:  03/16/22 213 lb (96.6 kg)  03/02/22 211 lb 6.4 oz (95.9 kg)  02/06/22 207 lb (93.9 kg)   BMI Readings from Last 3 Encounters:  03/16/22 35.45 kg/m  03/02/22 35.18 kg/m  02/06/22 34.45 kg/m     Vaccines:   HPV: N/A Tdap: up  to date Shingrix: up to date Pneumonia: N/A Flu: today  COVID-19: discussed booster    Hep C Screening: 01/23/19 STD testing and prevention (HIV/chl/gon/syphilis): 10/19/14 Intimate partner violence: negative screen  Sexual History : not sexually active  Menstrual History/LMP/Abnormal Bleeding: post-menopausal  Discussed importance of follow up if any post-menopausal bleeding: yes  Incontinence Symptoms: negative for symptoms   Breast cancer:  - Last Mammogram: Ordered 02/06/22 - BRCA gene screening: N/A  Osteoporosis Prevention : Discussed high calcium and vitamin D supplementation, weight bearing exercises Bone density: N/A   Cervical cancer screening: 12/14/17  Skin cancer: Discussed monitoring for atypical lesions  Colorectal cancer: 11/08/20   Lung cancer:  Low Dose CT Chest recommended if Age 37-80 years, 20 pack-year currently smoking OR have quit w/in 15years. Patient does not qualify for screen   ECG: 06/07/20  Advanced Care Planning: A voluntary discussion about advance care planning including the explanation and discussion of  advance directives.  Discussed health care proxy and Living will, and the patient was able to identify a health care proxy as sister .  Patient does not have a living will and power of attorney of health care   Lipids: Lab Results  Component Value Date   CHOL 199 02/06/2022   CHOL 179 01/31/2021   CHOL 184 01/29/2020   Lab Results  Component Value Date   HDL 41 (L) 02/06/2022   HDL 46 (L) 01/31/2021   HDL 46 (L) 01/29/2020   Lab Results  Component Value Date   LDLCALC 138 (H) 02/06/2022   LDLCALC 110 (H) 01/31/2021   LDLCALC 120 (H) 01/29/2020   Lab Results  Component Value Date   TRIG 102 02/06/2022   TRIG 119 01/31/2021   TRIG 83 01/29/2020   Lab Results  Component Value Date   CHOLHDL 4.9 02/06/2022   CHOLHDL 3.9 01/31/2021   CHOLHDL 4.0 01/29/2020   No results found for: "LDLDIRECT"  Glucose: Glucose, Bld  Date Value Ref  Range Status  02/06/2022 96 65 - 99 mg/dL Final    Comment:    .            Fasting reference interval .   08/26/2021 194 (H) 70 - 99 mg/dL Final    Comment:    Glucose reference range applies only to samples taken after fasting for at least 8 hours.  06/07/2020 93 65 - 99 mg/dL Final    Comment:    .            Fasting reference interval .     Patient Active Problem List   Diagnosis Date Noted   Perennial allergic rhinitis with seasonal variation 02/06/2022   Hypertension, benign 02/06/2022   Diverticulosis 03/17/2018   Nephrolithiasis 03/17/2018   B12 deficiency 10/23/2015   Dyslipidemia 10/21/2015   Bee allergy status 10/17/2014   Abnormal serum level of alkaline phosphatase 10/17/2014   Menopause 26/94/8546   Dysmetabolic syndrome 27/07/5007   Obesity (BMI 30-39.9) 10/17/2014   Vitamin D deficiency 10/17/2014    Past Surgical History:  Procedure Laterality Date   COLONOSCOPY WITH PROPOFOL N/A 11/08/2020   Procedure: COLONOSCOPY WITH PROPOFOL;  Surgeon: Lucilla Lame, MD;  Location: [place];  Service: Endoscopy;  Laterality: N/A;    Family History  Problem Relation Age of Onset   Cancer Mother        colon   Cancer Father        Liver and Lung   Stroke Father    Osteoarthritis Sister    Hypertension Brother    Diabetes Brother    Kidney cancer Brother    Diabetes Brother    Vascular Disease Brother    Breast cancer Neg Hx     Social History   Socioeconomic History   Marital status: Single    Spouse name: Not on file   Number of children: 0   Years of education: Not on file   Highest education level: High school graduate  Occupational History   Occupation: Gaffer     Employer: [employer]  Tobacco Use   Smoking status: Never   Smokeless tobacco: Never  Vaping Use   Vaping Use: Never used  Substance and Sexual Activity   Alcohol use: No    Alcohol/week: 0.0 standard drinks of alcohol   Drug use: No   Sexual  activity: Never  Other Topics Concern   Not on file  Social History Narrative   Lives  alone, works at [employer] during the day and for [employer] at night, 5 nights weekly    Social Determinants of Health   Financial Resource Strain: Low Risk  (03/16/2022)   Overall Financial Resource Strain (CARDIA)    Difficulty of Paying Living Expenses: Not hard at all  Food Insecurity: No Food Insecurity (03/16/2022)   Hunger Vital Sign    Worried About Running Out of Food in the Last Year: Never true    Ran Out of Food in the Last Year: Never true  Transportation Needs: No Transportation Needs (03/16/2022)   PRAPARE - Hydrologist (Medical): No    Lack of Transportation (Non-Medical): No  Physical Activity: Inactive (03/16/2022)   Exercise Vital Sign    Days of Exercise per Week: 0 days    Minutes of Exercise per Session: 0 min  Stress: Unknown (03/16/2022)   [place]    Feeling of Stress : Patient refused  Social Connections: Socially Isolated (03/16/2022)   Social Connection and Isolation Panel [NHANES]    Frequency of Communication with Friends and Family: Once a week    Frequency of Social Gatherings with Friends and Family: Never    Attends Religious Services: Never    Marine scientist or Organizations: No    Attends Archivist Meetings: Never    Marital Status: Never married  Intimate Partner Violence: Not At Risk (03/16/2022)   Humiliation, Afraid, Rape, and Kick questionnaire    Fear of Current or Ex-Partner: No    Emotionally Abused: No    Physically Abused: No    Sexually Abused: No     Current Outpatient Medications:    ammonium lactate (LAC-HYDRIN) 12 % cream, Apply topically as needed for dry skin., Disp: 385 g, Rfl: 0   Cholecalciferol (VITAMIN D) 2000 UNITS CAPS, Take 1 capsule by mouth as needed., Disp: , Rfl:    desonide (DESOWEN) 0.05 %  cream, Apply topically 2 (two) times daily., Disp: 30 g, Rfl: 0   EPINEPHrine (EPIPEN 2-PAK) 0.3 mg/0.3 mL IJ SOAJ injection, Inject 0.3 mg into the muscle as needed for anaphylaxis., Disp: 1 each, Rfl: 1   loratadine (CLARITIN) 10 MG tablet, TAKE 1 TABLET BY MOUTH TWICE A DAY, Disp: 60 tablet, Rfl: 0   losartan (COZAAR) 25 MG tablet, Take 0.5 tablets (12.5 mg total) by mouth daily., Disp: 45 tablet, Rfl: 1   triamcinolone cream (KENALOG) 0.1 %, Apply topically 2 (two) times daily., Disp: 80 g, Rfl: 1  Allergies  Allergen Reactions   Bee Venom    Mixed Vespid Venom     Other reaction(s): Unknown     ROS  Constitutional: Negative for fever or weight change.  Respiratory: Negative for cough and shortness of breath.   Cardiovascular: Negative for chest pain or palpitations.  Gastrointestinal: Negative for abdominal pain, no bowel changes.  Musculoskeletal: Negative for gait problem or joint swelling.  Skin: Negative for rash.  Neurological: Negative for dizziness or headache.  No other specific complaints in a complete review of systems (except as listed in HPI above).   Objective  Vitals:   03/16/22 1428  BP: 120/84  Pulse: 81  Resp: 16  SpO2: 95%  Weight: 213 lb (96.6 kg)  Height: _0  (1.651 m)    Body mass index is 35.45 kg/m.  Physical Exam  Constitutional: Patient appears well-developed and well-nourished. No distress.  HENT: Head: Normocephalic and atraumatic.  Ears: B TMs ok, no erythema or effusion; Nose: Nose normal. Mouth/Throat: Oropharynx is clear and moist. No oropharyngeal exudate.  Eyes: Conjunctivae and EOM are normal. Pupils are equal, round, and reactive to light. No scleral icterus.  Neck: Normal range of motion. Neck supple. No JVD present. No thyromegaly present.  Cardiovascular: Normal rate, regular rhythm and normal heart sounds.  No murmur heard. No BLE edema. Pulmonary/Chest: Effort normal and breath sounds normal. No respiratory  distress. Abdominal: Soft. Bowel sounds are normal, no distension. There is no tenderness. no masses Breast: no lumps or masses, no nipple discharge or rashes FEMALE GENITALIA:  Not done  RECTAL: not done  Musculoskeletal: Normal range of motion, no joint effusions. No gross deformities Neurological: he is alert and oriented to person, place, and time. No cranial nerve deficit. Coordination, balance, strength, speech and gait are normal.  Skin: eczematous patches on arms  Psychiatric: Patient has a normal mood and affect. behavior is normal. Judgment and thought content normal.   Recent Results (from the past 2160 hour(s))  Lipid panel     Status: Abnormal   Collection Time: 02/06/22  3:28 PM  Result Value Ref Range   Cholesterol 199 <200 mg/dL   HDL 41 (L) > OR = 50 mg/dL   Triglycerides 102 <150 mg/dL   LDL Cholesterol (Calc) 138 (H) mg/dL (calc)    Comment: Reference range: <100 . Desirable range <100 mg/dL for primary prevention;   <70 mg/dL for patients with CHD or diabetic patients  with > or = 2 CHD risk factors. Marland Kitchen LDL-C is now calculated using the Martin-Hopkins  calculation, which is a validated novel method providing  better accuracy than the Friedewald equation in the  estimation of LDL-C.  Cresenciano Genre et al. Annamaria Helling. 8676;195(09): 2061-2068  (http://education.[facility].com/faq/FAQ164)    Total CHOL/HDL Ratio 4.9 <5.0 (calc)   Non-HDL Cholesterol (Calc) 158 (H) <130 mg/dL (calc)    Comment: For patients with diabetes plus 1 major ASCVD risk  factor, treating to a non-HDL-C goal of <100 mg/dL  (LDL-C of <70 mg/dL) is considered a therapeutic  option.   B12 and Folate Panel     Status: None   Collection Time: 02/06/22  3:28 PM  Result Value Ref Range   Vitamin B-12 653 200 - 1,100 pg/mL   Folate 20.4 ng/mL    Comment:                            Reference Range                            Low:           <3.4                            Borderline:    3.4-5.4                             Normal:        >5.4 .   COMPLETE METABOLIC PANEL WITH GFR     Status: None   Collection Time: 02/06/22  3:28 PM  Result Value Ref Range   Glucose, Bld 96 65 - 99 mg/dL    Comment: .            Fasting reference interval .  BUN 11 7 - 25 mg/dL   Creat 0.85 0.50 - 1.03 mg/dL   eGFR 81 > OR = 60 mL/min/1.46m   BUN/Creatinine Ratio SEE NOTE: 6 - 22 (calc)    Comment:    Not Reported: BUN and Creatinine are within    reference range. .    Sodium 137 135 - 146 mmol/L   Potassium 4.0 3.5 - 5.3 mmol/L   Chloride 103 98 - 110 mmol/L   CO2 23 20 - 32 mmol/L   Calcium 9.8 8.6 - 10.4 mg/dL   Total Protein 7.5 6.1 - 8.1 g/dL   Albumin 4.4 3.6 - 5.1 g/dL   Globulin 3.1 1.9 - 3.7 g/dL (calc)   AG Ratio 1.4 1.0 - 2.5 (calc)   Total Bilirubin 0.5 0.2 - 1.2 mg/dL   Alkaline phosphatase (APISO) 134 37 - 153 U/L   AST 17 10 - 35 U/L   ALT 20 6 - 29 U/L  CBC with Differential/Platelet     Status: None   Collection Time: 02/06/22  3:28 PM  Result Value Ref Range   WBC 8.8 3.8 - 10.8 Thousand/uL   RBC 4.59 3.80 - 5.10 Million/uL   Hemoglobin 13.6 11.7 - 15.5 g/dL   HCT 39.9 35.0 - 45.0 %   MCV 86.9 80.0 - 100.0 fL   MCH 29.6 27.0 - 33.0 pg   MCHC 34.1 32.0 - 36.0 g/dL   RDW 13.8 11.0 - 15.0 %   Platelets 303 140 - 400 Thousand/uL   MPV 9.9 7.5 - 12.5 fL   Neutro Abs 5,122 1,500 - 7,800 cells/uL   Lymphs Abs 3,010 850 - 3,900 cells/uL   Absolute Monocytes 537 200 - 950 cells/uL   Eosinophils Absolute 88 15 - 500 cells/uL   Basophils Absolute 44 0 - 200 cells/uL   Neutrophils Relative % 58.2 %   Total Lymphocyte 34.2 %   Monocytes Relative 6.1 %   Eosinophils Relative 1.0 %   Basophils Relative 0.5 %  VITAMIN D 25 Hydroxy (Vit-D Deficiency, Fractures)     Status: Abnormal   Collection Time: 02/06/22  3:28 PM  Result Value Ref Range   Vit D, 25-Hydroxy 27 (L) 30 - 100 ng/mL    Comment: Vitamin D Status         25-OH Vitamin D: . Deficiency:                     <20 ng/mL Insufficiency:             20 - 29 ng/mL Optimal:                 > or = 30 ng/mL . For 25-OH Vitamin D testing on patients on  D2-supplementation and patients for whom quantitation  of D2 and D3 fractions is required, the [facility](TM) 25-OH VIT D, (D2,D3), LC/MS/MS is recommended: order  code 9289-822-4503(patients >277yr. . See Note 1 . Note 1 . For additional information, please refer to  http://education.[facility].com/faq/FAQ199  (This link is being provided for informational/ educational purposes only.)   Hemoglobin A1c     Status: Abnormal   Collection Time: 02/06/22  3:28 PM  Result Value Ref Range   Hgb A1c MFr Bld 5.9 (H) <5.7 % of total Hgb    Comment: For someone without known diabetes, a hemoglobin  A1c value between 5.7% and 6.4% is consistent with prediabetes and should be confirmed with a  follow-up test. . For someone with known  diabetes, a value <7% indicates that their diabetes is well controlled. A1c targets should be individualized based on duration of diabetes, age, comorbid conditions, and other considerations. . This assay result is consistent with an increased risk of diabetes. . Currently, no consensus exists regarding use of hemoglobin A1c for diagnosis of diabetes for children. .    Mean Plasma Glucose 123 mg/dL   eAG (mmol/L) 6.8 mmol/L     Fall Risk:    03/16/2022    2:30 PM 03/02/2022    2:30 PM 02/06/2022    2:56 PM 01/02/2022   10:39 AM 09/01/2021   11:36 AM  Fall Risk   Falls in the past year? 0 0 0 0 0  Number falls in past yr: 0  0 0 0  Injury with Fall? 0  0 0 0  Risk for fall due to : _0   Follow up Falls prevention discussed Falls prevention discussed;Education provided;Falls evaluation completed Falls prevention discussed Falls prevention discussed Falls prevention discussed     Functional Status Survey: Is the patient deaf or have  difficulty hearing?: No Does the patient have difficulty seeing, even when wearing glasses/contacts?: No Does the patient have difficulty concentrating, remembering, or making decisions?: No Does the patient have difficulty walking or climbing stairs?: No Does the patient have difficulty dressing or bathing?: No Does the patient have difficulty doing errands alone such as visiting a doctor's office or shopping?: No   Assessment & Plan  1. Well adult exam  She needs to schedule a mammogram    -USPSTF grade A and B recommendations reviewed with patient; age-appropriate recommendations, preventive care, screening tests, etc discussed and encouraged; healthy living encouraged; see AVS for patient education given to patient -Discussed importance of 150 minutes of physical activity weekly, eat two servings of fish weekly, eat one serving of tree nuts ( cashews, pistachios, pecans, almonds.Marland Kitchen) every other day, eat 6 servings of fruit/vegetables daily and drink plenty of water and avoid sweet beverages.   -Reviewed Health Maintenance: Yes.

## 2022-03-13 NOTE — Patient Instructions (Signed)
Preventive Care 55-55 Years Old, Female Preventive care refers to lifestyle choices and visits with your health care provider that can promote health and wellness. Preventive care visits are also called wellness exams. What can I expect for my preventive care visit? Counseling Your health care provider may ask you questions about your: Medical history, including: Past medical problems. Family medical history. Pregnancy history. Current health, including: Menstrual cycle. Method of birth control. Emotional well-being. Home life and relationship well-being. Sexual activity and sexual health. Lifestyle, including: Alcohol, nicotine or tobacco, and drug use. Access to firearms. Diet, exercise, and sleep habits. Work and work environment. Sunscreen use. Safety issues such as seatbelt and bike helmet use. Physical exam Your health care provider will check your: Height and weight. These may be used to calculate your BMI (body mass index). BMI is a measurement that tells if you are at a healthy weight. Waist circumference. This measures the distance around your waistline. This measurement also tells if you are at a healthy weight and may help predict your risk of certain diseases, such as type 2 diabetes and high blood pressure. Heart rate and blood pressure. Body temperature. Skin for abnormal spots. What immunizations do I need?  Vaccines are usually given at various ages, according to a schedule. Your health care provider will recommend vaccines for you based on your age, medical history, and lifestyle or other factors, such as travel or where you work. What tests do I need? Screening Your health care provider may recommend screening tests for certain conditions. This may include: Lipid and cholesterol levels. Diabetes screening. This is done by checking your blood sugar (glucose) after you have not eaten for a while (fasting). Pelvic exam and Pap test. Hepatitis B test. Hepatitis C  test. HIV (human immunodeficiency virus) test. STI (sexually transmitted infection) testing, if you are at risk. Lung cancer screening. Colorectal cancer screening. Mammogram. Talk with your health care provider about when you should start having regular mammograms. This may depend on whether you have a family history of breast cancer. BRCA-related cancer screening. This may be done if you have a family history of breast, ovarian, tubal, or peritoneal cancers. Bone density scan. This is done to screen for osteoporosis. Talk with your health care provider about your test results, treatment options, and if necessary, the need for more tests. Follow these instructions at home: Eating and drinking  Eat a diet that includes fresh fruits and vegetables, whole grains, lean protein, and low-fat dairy products. Take vitamin and mineral supplements as recommended by your health care provider. Do not drink alcohol if: Your health care provider tells you not to drink. You are pregnant, may be pregnant, or are planning to become pregnant. If you drink alcohol: Limit how much you have to 0-1 drink a day. Know how much alcohol is in your drink. In the [place] one drink equals one 12 oz bottle of beer (355 mL), one 5 oz glass of wine (148 mL), or one 1 oz glass of hard liquor (44 mL). Lifestyle Brush your teeth every morning and night with fluoride toothpaste. Floss one time each day. Exercise for at least 30 minutes 5 or more days each week. Do not use any products that contain nicotine or tobacco. These products include cigarettes, chewing tobacco, and vaping devices, such as e-cigarettes. If you need help quitting, ask your health care provider. Do not use drugs. If you are sexually active, practice safe sex. Use a condom or other form of protection to   prevent STIs. If you do not wish to become pregnant, use a form of birth control. If you plan to become pregnant, see your health care provider for a  prepregnancy visit. Take aspirin only as told by your health care provider. Make sure that you understand how much to take and what form to take. Work with your health care provider to find out whether it is safe and beneficial for you to take aspirin daily. Find healthy ways to manage stress, such as: Meditation, yoga, or listening to music. Journaling. Talking to a trusted person. Spending time with friends and family. Minimize exposure to UV radiation to reduce your risk of skin cancer. Safety Always wear your seat belt while driving or riding in a vehicle. Do not drive: If you have been drinking alcohol. Do not ride with someone who has been drinking. When you are tired or distracted. While texting. If you have been using any mind-altering substances or drugs. Wear a helmet and other protective equipment during sports activities. If you have firearms in your house, make sure you follow all gun safety procedures. Seek help if you have been physically or sexually abused. What's next? Visit your health care provider once a year for an annual wellness visit. Ask your health care provider how often you should have your eyes and teeth checked. Stay up to date on all vaccines. This information is not intended to replace advice given to you by your health care provider. Make sure you discuss any questions you have with your health care provider. Document Revised: 10/16/2020 Document Reviewed: 10/16/2020 Elsevier Patient Education  [place].

## 2022-03-16 ENCOUNTER — Ambulatory Visit (INDEPENDENT_AMBULATORY_CARE_PROVIDER_SITE_OTHER): Payer: BC Managed Care – PPO | Admitting: Family Medicine

## 2022-03-16 ENCOUNTER — Encounter: Payer: Self-pay | Admitting: Family Medicine

## 2022-03-16 VITALS — BP 120/84 | HR 81 | Resp 16 | Ht 65.0 in | Wt 213.0 lb

## 2022-03-16 DIAGNOSIS — Z0000 Encounter for general adult medical examination without abnormal findings: Secondary | ICD-10-CM

## 2022-03-16 DIAGNOSIS — N6310 Unspecified lump in the right breast, unspecified quadrant: Secondary | ICD-10-CM | POA: Diagnosis not present

## 2022-03-16 DIAGNOSIS — Z23 Encounter for immunization: Secondary | ICD-10-CM | POA: Diagnosis not present

## 2022-04-03 ENCOUNTER — Ambulatory Visit
Admission: RE | Admit: 2022-04-03 | Discharge: 2022-04-03 | Disposition: A | Discharge status: Home / Self Care | Payer: BC Managed Care – PPO | Source: Ambulatory Visit | Attending: Family Medicine | Admitting: Family Medicine

## 2022-04-03 DIAGNOSIS — N6341 Unspecified lump in right breast, subareolar: Secondary | ICD-10-CM | POA: Diagnosis not present

## 2022-04-03 DIAGNOSIS — N6310 Unspecified lump in the right breast, unspecified quadrant: Secondary | ICD-10-CM

## 2022-04-06 ENCOUNTER — Other Ambulatory Visit: Payer: Self-pay | Admitting: Family Medicine

## 2022-04-06 DIAGNOSIS — R928 Other abnormal and inconclusive findings on diagnostic imaging of breast: Secondary | ICD-10-CM

## 2022-04-06 DIAGNOSIS — N63 Unspecified lump in unspecified breast: Secondary | ICD-10-CM

## 2022-04-21 ENCOUNTER — Ambulatory Visit
Admission: RE | Admit: 2022-04-21 | Discharge: 2022-04-21 | Disposition: A | Discharge status: Home / Self Care | Payer: BC Managed Care – PPO | Source: Ambulatory Visit | Attending: Family Medicine | Admitting: Family Medicine

## 2022-04-21 DIAGNOSIS — R928 Other abnormal and inconclusive findings on diagnostic imaging of breast: Secondary | ICD-10-CM | POA: Insufficient documentation

## 2022-04-21 DIAGNOSIS — N63 Unspecified lump in unspecified breast: Secondary | ICD-10-CM | POA: Insufficient documentation

## 2022-04-21 DIAGNOSIS — N6315 Unspecified lump in the right breast, overlapping quadrants: Secondary | ICD-10-CM | POA: Diagnosis not present

## 2022-04-21 HISTORY — PX: BREAST BIOPSY: SHX20

## 2022-05-06 ENCOUNTER — Other Ambulatory Visit: Payer: Self-pay | Admitting: Family Medicine

## 2022-05-06 DIAGNOSIS — R928 Other abnormal and inconclusive findings on diagnostic imaging of breast: Secondary | ICD-10-CM

## 2022-05-06 DIAGNOSIS — N63 Unspecified lump in unspecified breast: Secondary | ICD-10-CM

## 2022-08-13 NOTE — Progress Notes (Signed)
Name: Tamara Nelson   MRN: [id number]    DOB: 05-29-1965   Date:08/14/2022       Progress Note  Subjective  Chief Complaint  Follow Up  HPI  HTN: she has been taking taking half pill of losartan 25 mg daily, BP is at goal, very seldom she skips a dose now .  No chest pain, palpitation or SOB    B12 and Vitamin D deficiency: she takes supplements occasionally.   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  The 10-year ASCVD risk score (Arnett DK, et al., 2019) is: 3.5%   Values used to calculate the score:     Age: 57 years     Sex: Female     Is Non-Hispanic African American: No     Diabetic: No     Tobacco smoker: No     Systolic Blood Pressure: 122 mmHg     Is BP treated: Yes     HDL Cholesterol: 41 mg/dL     Total Cholesterol: 199 mg/dL   Pre-diabetes: I1W is now 5.9 % discussed importance of low carb diet, she is still drinking sweet tea but cutting down on soda intake, no longer drinking sodas daily. Drinking more water  Perennial AR with seasonal variation: worse in the Fall. Eyes gets itchy, no rhinorrhea or nasal congestion at this time . She takes otc loratadine and prn fluticasone   Nail discoloration : only on left second and 5th toe, she mows yard, it id not black only darker than usual, no pain   Patient Active Problem List   Diagnosis Date Noted   Perennial allergic rhinitis with seasonal variation 02/06/2022   Hypertension, benign 02/06/2022   Diverticulosis 03/17/2018   Nephrolithiasis 03/17/2018   B12 deficiency 10/23/2015   Dyslipidemia 10/21/2015   Bee allergy status 10/17/2014   Abnormal serum level of alkaline phosphatase 10/17/2014   Menopause 10/17/2014   Dysmetabolic syndrome 10/17/2014   Obesity (BMI 30-39.9) 10/17/2014   Vitamin D deficiency 10/17/2014    Past Surgical History:  Procedure Laterality Date   BREAST BIOPSY Right 04/21/2022   [place] RT BREAST BX W LOC DEV 1ST LESION IMG BX SPEC US  GUIDE 04/21/2022 [facility]   COLONOSCOPY WITH PROPOFOL N/A 11/08/2020   Procedure: COLONOSCOPY WITH PROPOFOL;  Surgeon: Midge Minium, MD;  Location: [facility];  Service: Endoscopy;  Laterality: N/A;    Family History  Problem Relation Age of Onset   Cancer Mother        colon   Cancer Father        Liver and Lung   Stroke Father    Osteoarthritis Sister    Hypertension Brother    Diabetes Brother    Kidney cancer Brother    Diabetes Brother    Vascular Disease Brother    Breast cancer Neg Hx     Social History   Tobacco Use   Smoking status: Never   Smokeless tobacco: Never  Substance Use Topics   Alcohol use: No    Alcohol/week: 0.0 standard drinks of alcohol     Current Outpatient Medications:    ammonium lactate (LAC-HYDRIN) 12 % cream, Apply topically as needed for dry skin., Disp: 385 g, Rfl: 0   Cholecalciferol (VITAMIN D) 2000 UNITS CAPS, Take 1 capsule by mouth as needed., Disp: , Rfl:    desonide (DESOWEN) 0.05 % cream, Apply topically 2 (two) times daily., Disp: 30 g, Rfl: 0   EPINEPHrine (  EPIPEN 2-PAK) 0.3 mg/0.3 mL IJ SOAJ injection, Inject 0.3 mg into the muscle as needed for anaphylaxis., Disp: 1 each, Rfl: 1   loratadine (CLARITIN) 10 MG tablet, TAKE 1 TABLET BY MOUTH TWICE A DAY, Disp: 60 tablet, Rfl: 0   losartan (COZAAR) 25 MG tablet, Take 0.5 tablets (12.5 mg total) by mouth daily., Disp: 45 tablet, Rfl: 1   triamcinolone cream (KENALOG) 0.1 %, Apply topically 2 (two) times daily., Disp: 80 g, Rfl: 1  Allergies  Allergen Reactions   Bee Venom    Mixed Vespid Venom     Other reaction(s): Unknown    I personally reviewed active problem list, medication list, allergies, family history, social history, health maintenance with the patient/caregiver today.   ROS  Constitutional: Negative for fever or weight change.  Respiratory: Negative for cough and shortness of breath.   Cardiovascular: Negative for chest pain or palpitations.   Gastrointestinal: Negative for abdominal pain, no bowel changes.  Musculoskeletal: Negative for gait problem or joint swelling.  Skin: Negative for rash.  Neurological: Negative for dizziness or headache.  No other specific complaints in a complete review of systems (except as listed in HPI above).   Objective  Vitals:   08/14/22 1448  BP: 122/80  Pulse: 75  Resp: 16  Temp: 97.8 F (36.6 C)  TempSrc: Oral  SpO2: 95%  Weight: 209 lb 14.4 oz (95.2 kg)  Height: 5\' 5"  (1.651 m)    Body mass index is 34.93 kg/m.  Physical Exam  Constitutional: Patient appears well-developed and well-nourished. Obese  No distress.  HEENT: head atraumatic, normocephalic, pupils equal and reactive to light, neck supple Cardiovascular: Normal rate, regular rhythm and normal heart sounds.  No murmur heard. No BLE edema. Pulmonary/Chest: Effort normal and breath sounds normal. No respiratory distress. Abdominal: Soft.  There is no tenderness. Muscular skeletal: ganglion cyst left ventral wrist  Psychiatric: Patient has a normal mood and affect. behavior is normal. Judgment and thought content normal.   PHQ2/9:    08/14/2022    2:47 PM 03/16/2022    2:30 PM 03/02/2022    2:30 PM 02/06/2022    2:56 PM 01/02/2022   10:39 AM  Depression screen PHQ 2/9  Decreased Interest 0 0 0 0 0  Down, Depressed, Hopeless 0 0 0 0 0  PHQ - 2 Score 0 0 0 0 0  Altered sleeping 0 0 0 0 0  Tired, decreased energy 0 0 0 0 0  Change in appetite 0 0 0 0 0  Feeling bad or failure about yourself  0 0 0 0 0  Trouble concentrating 0 0 0 0 0  Moving slowly or fidgety/restless 0 0 0 0 0  Suicidal thoughts 0 0 0 0 0  PHQ-9 Score 0 0 0 0 0    phq 9 is negative   Fall Risk:    08/14/2022    2:47 PM 03/16/2022    2:30 PM 03/02/2022    2:30 PM 02/06/2022    2:56 PM 01/02/2022   10:39 AM  Fall Risk   Falls in the past year? 0 0 0 0 0  Number falls in past yr:  0  0 0  Injury with Fall?  0  0 0  Risk for fall due to :  No Fall Risks No Fall Risks No Fall Risks No Fall Risks No Fall Risks  Follow up Falls prevention discussed Falls prevention discussed Falls prevention discussed;Education provided;Falls evaluation completed Falls prevention discussed Falls  prevention discussed     Functional Status Survey: Is the patient deaf or have difficulty hearing?: No Does the patient have difficulty seeing, even when wearing glasses/contacts?: No Does the patient have difficulty concentrating, remembering, or making decisions?: No Does the patient have difficulty walking or climbing stairs?: No Does the patient have difficulty dressing or bathing?: No Does the patient have difficulty doing errands alone such as visiting a doctor's office or shopping?: No   Assessment & Plan  1. Hypertension, benign  - losartan (COZAAR) 25 MG tablet; Take 0.5 tablets (12.5 mg total) by mouth daily.  Dispense: 45 tablet; Refill: 1  2. Vitamin D deficiency  Continue supplementation   3. B12 deficiency  Continue supplementation  4. Dysmetabolic syndrome  Continue life style modification  5. Dyslipidemia  Discussed last labs   6. Bee allergy status  - EPINEPHrine (EPIPEN 2-PAK) 0.3 mg/0.3 mL IJ SOAJ injection; Inject 0.3 mg into the muscle as needed for anaphylaxis.  Dispense: 2 each; Refill: 1  7. Perennial allergic rhinitis with seasonal variation   8. Rash  Intermittently, she has rhus dermatitis  - triamcinolone cream (KENALOG) 0.1 %; Apply topically 2 (two) times daily.  Dispense: 80 g; Refill: 1 .

## 2022-08-14 ENCOUNTER — Encounter: Payer: Self-pay | Admitting: Family Medicine

## 2022-08-14 ENCOUNTER — Ambulatory Visit: Payer: BC Managed Care – PPO | Admitting: Family Medicine

## 2022-08-14 VITALS — BP 122/80 | HR 75 | Temp 97.8°F | Resp 16 | Ht 65.0 in | Wt 209.9 lb

## 2022-08-14 DIAGNOSIS — J3089 Other allergic rhinitis: Secondary | ICD-10-CM

## 2022-08-14 DIAGNOSIS — R21 Rash and other nonspecific skin eruption: Secondary | ICD-10-CM

## 2022-08-14 DIAGNOSIS — E559 Vitamin D deficiency, unspecified: Secondary | ICD-10-CM

## 2022-08-14 DIAGNOSIS — E8881 Metabolic syndrome: Secondary | ICD-10-CM

## 2022-08-14 DIAGNOSIS — E538 Deficiency of other specified B group vitamins: Secondary | ICD-10-CM

## 2022-08-14 DIAGNOSIS — I10 Essential (primary) hypertension: Secondary | ICD-10-CM | POA: Diagnosis not present

## 2022-08-14 DIAGNOSIS — Z91030 Bee allergy status: Secondary | ICD-10-CM

## 2022-08-14 DIAGNOSIS — E785 Hyperlipidemia, unspecified: Secondary | ICD-10-CM

## 2022-08-14 DIAGNOSIS — J302 Other seasonal allergic rhinitis: Secondary | ICD-10-CM

## 2022-08-14 MED ORDER — TRIAMCINOLONE ACETONIDE 0.1 % EX CREA: TOPICAL_CREAM | Freq: Two times a day (BID) | CUTANEOUS | 1 refills | Status: DC

## 2022-08-14 MED ORDER — EPINEPHRINE 0.3 MG/0.3ML IJ SOAJ: 0.3000 mg | INTRAMUSCULAR | 1 refills | Status: DC | PRN

## 2022-08-14 MED ORDER — LOSARTAN POTASSIUM 25 MG PO TABS: 12.5000 mg | ORAL_TABLET | Freq: Every day | ORAL | 1 refills | Status: DC

## 2022-09-04 ENCOUNTER — Other Ambulatory Visit: Payer: Self-pay | Admitting: Family Medicine

## 2022-09-04 DIAGNOSIS — I10 Essential (primary) hypertension: Secondary | ICD-10-CM

## 2022-10-22 ENCOUNTER — Ambulatory Visit
Admission: RE | Admit: 2022-10-22 | Discharge: 2022-10-22 | Disposition: A | Discharge status: Home / Self Care | Payer: PRIVATE HEALTH INSURANCE | Source: Ambulatory Visit | Attending: Family Medicine | Admitting: Family Medicine

## 2022-10-22 DIAGNOSIS — R928 Other abnormal and inconclusive findings on diagnostic imaging of breast: Secondary | ICD-10-CM

## 2022-10-22 DIAGNOSIS — N63 Unspecified lump in unspecified breast: Secondary | ICD-10-CM | POA: Diagnosis present

## 2022-10-26 ENCOUNTER — Other Ambulatory Visit: Payer: Self-pay | Admitting: Family Medicine

## 2022-10-26 ENCOUNTER — Other Ambulatory Visit: Payer: Self-pay

## 2022-10-26 DIAGNOSIS — N6310 Unspecified lump in the right breast, unspecified quadrant: Secondary | ICD-10-CM

## 2022-10-26 DIAGNOSIS — Z1231 Encounter for screening mammogram for malignant neoplasm of breast: Secondary | ICD-10-CM

## 2022-10-26 DIAGNOSIS — R928 Other abnormal and inconclusive findings on diagnostic imaging of breast: Secondary | ICD-10-CM

## 2022-12-01 ENCOUNTER — Ambulatory Visit (INDEPENDENT_AMBULATORY_CARE_PROVIDER_SITE_OTHER): Payer: PRIVATE HEALTH INSURANCE | Admitting: Family Medicine

## 2022-12-01 ENCOUNTER — Ambulatory Visit: Payer: Self-pay

## 2022-12-01 ENCOUNTER — Encounter: Payer: Self-pay | Admitting: Family Medicine

## 2022-12-01 VITALS — BP 128/80 | HR 75 | Resp 16 | Ht 65.0 in | Wt 211.0 lb

## 2022-12-01 DIAGNOSIS — L255 Unspecified contact dermatitis due to plants, except food: Secondary | ICD-10-CM | POA: Diagnosis not present

## 2022-12-01 MED ORDER — PREDNISONE 10 MG PO TABS
10.0000 mg | ORAL_TABLET | ORAL | 0 refills | Status: DC
Start: 2022-12-01 — End: 2023-01-20

## 2022-12-01 NOTE — Progress Notes (Signed)
Name: Mikeala Carlock   MRN: [id number]    DOB: [birth year]   Date:12/01/2022       Progress Note  Subjective  Chief Complaint  Poison Oak  HPI  Patient was working in her neighbors yard on Sunday and developed red , raised in linear and sometimes papular area on left arm hours after being outside working on flower bed. It is itchy, no oozing at this time, applying topical triamcinolone    Patient Active Problem List   Diagnosis Date Noted   Perennial allergic rhinitis with seasonal variation 02/06/2022   Hypertension, benign 02/06/2022   Diverticulosis 03/17/2018   Nephrolithiasis 03/17/2018   B12 deficiency 10/23/2015   Dyslipidemia 10/21/2015   Bee allergy status 10/17/2014   Abnormal serum level of alkaline phosphatase 10/17/2014   Menopause 10/17/2014   Dysmetabolic syndrome 10/17/2014   Obesity (BMI 30-39.9) 10/17/2014   Vitamin D deficiency 10/17/2014    Past Surgical History:  Procedure Laterality Date   BREAST BIOPSY Right 04/21/2022   [place] RT BREAST BX W LOC DEV 1ST LESION IMG BX SPEC US GUIDE 04/21/2022 [facility]   COLONOSCOPY WITH PROPOFOL N/A 11/08/2020   Procedure: COLONOSCOPY WITH PROPOFOL;  Surgeon: Midge Minium, MD;  Location: [facility];  Service: Endoscopy;  Laterality: N/A;    Family History  Problem Relation Age of Onset   Cancer Mother        colon   Cancer Father        Liver and Lung   Stroke Father    Osteoarthritis Sister    Hypertension Brother    Diabetes Brother    Kidney cancer Brother    Diabetes Brother    Vascular Disease Brother    Breast cancer Neg Hx     Social History   Tobacco Use   Smoking status: Never   Smokeless tobacco: Never  Substance Use Topics   Alcohol use: No    Alcohol/week: 0.0 standard drinks of alcohol     Current Outpatient Medications:    ammonium lactate (LAC-HYDRIN) 12 % cream, Apply topically as needed for dry skin., Disp: 385 g, Rfl: 0   Cholecalciferol (VITAMIN D) 2000 UNITS  CAPS, Take 1 capsule by mouth as needed., Disp: , Rfl:    desonide (DESOWEN) 0.05 % cream, Apply topically 2 (two) times daily., Disp: 30 g, Rfl: 0   EPINEPHrine (EPIPEN 2-PAK) 0.3 mg/0.3 mL IJ SOAJ injection, Inject 0.3 mg into the muscle as needed for anaphylaxis., Disp: 2 each, Rfl: 1   loratadine (CLARITIN) 10 MG tablet, TAKE 1 TABLET BY MOUTH TWICE A DAY, Disp: 60 tablet, Rfl: 0   losartan (COZAAR) 25 MG tablet, Take 0.5 tablets (12.5 mg total) by mouth daily., Disp: 45 tablet, Rfl: 1   triamcinolone cream (KENALOG) 0.1 %, Apply topically 2 (two) times daily., Disp: 80 g, Rfl: 1  Allergies  Allergen Reactions   Bee Venom    Mixed Vespid Venom     Other reaction(s): Unknown    I personally reviewed active problem list, medication list, allergies, family history, social history, health maintenance with the patient/caregiver today.   ROS  Ten systems reviewed and is negative except as mentioned in HPI    Objective  Vitals:   12/01/22 1524  BP: 128/80  Pulse: 75  Resp: 16  SpO2: 95%  Weight: 211 lb (95.7 kg)  Height: 5\' 5"  (1.651 m)    Body mass index is 35.11 kg/m.  Physical Exam  Constitutional: Patient appears well-developed and well-nourished.  Obese  No distress.  HEENT: head atraumatic, normocephalic, pupils equal and reactive to light,  neck supple, throat within normal limits Cardiovascular: Normal rate, regular rhythm and normal heart sounds.  No murmur heard. No BLE edema. Pulmonary/Chest: Effort normal and breath sounds normal. No respiratory distress. Skin: erythematous rash, macule, papules on both arms, left worse than right  Abdominal: Soft.  There is no tenderness. Psychiatric: Patient has a normal mood and affect. behavior is normal. Judgment and thought content normal.    PHQ2/9:    12/01/2022    3:24 PM 08/14/2022    2:47 PM 03/16/2022    2:30 PM 03/02/2022    2:30 PM 02/06/2022    2:56 PM  Depression screen PHQ 2/9  Decreased Interest 0 0 0 0 0   Down, Depressed, Hopeless 0 0 0 0 0  PHQ - 2 Score 0 0 0 0 0  Altered sleeping  0 0 0 0  Tired, decreased energy  0 0 0 0  Change in appetite  0 0 0 0  Feeling bad or failure about yourself   0 0 0 0  Trouble concentrating  0 0 0 0  Moving slowly or fidgety/restless  0 0 0 0  Suicidal thoughts  0 0 0 0  PHQ-9 Score  0 0 0 0    phq 9 is negative   Fall Risk:    12/01/2022    3:23 PM 08/14/2022    2:47 PM 03/16/2022    2:30 PM 03/02/2022    2:30 PM 02/06/2022    2:56 PM  Fall Risk   Falls in the past year? 0 0 0 0 0  Number falls in past yr: 0  0  0  Injury with Fall? 0  0  0  Risk for fall due to : No Fall Risks No Fall Risks No Fall Risks No Fall Risks No Fall Risks  Follow up Falls prevention discussed Falls prevention discussed Falls prevention discussed Falls prevention discussed;Education provided;Falls evaluation completed Falls prevention discussed      Functional Status Survey: Is the patient deaf or have difficulty hearing?: No Does the patient have difficulty seeing, even when wearing glasses/contacts?: No Does the patient have difficulty concentrating, remembering, or making decisions?: No Does the patient have difficulty walking or climbing stairs?: No Does the patient have difficulty dressing or bathing?: No Does the patient have difficulty doing errands alone such as visiting a doctor's office or shopping?: No    Assessment & Plan  1. Rhus dermatitis  - predniSONE (DELTASONE) 10 MG tablet; Take 1 tablet (10 mg total) by mouth as directed. 6x2 days, 5 x 2days,4x2days,3x2day,2x2days and 1x2days  Dispense: 42 tablet; Refill: 0

## 2022-12-01 NOTE — Telephone Encounter (Signed)
Message from Glen Gardner T sent at 12/01/2022  8:39 AM EDT  Summary: poison oak   Patient called stated she has poison oak on her arms and need a cream to use. Please f/u with patient as he has already tried Benadryl.         Chief Complaint: rash to bilateral forearms Symptoms: itching, redness, mild swelling Frequency: Since Sat Pertinent Negatives: Patient denies fever, rash elsewhere  Disposition: [] ED /[] Urgent Care (no appt availability in office) / [x] Appointment(In office/virtual)/ []  [facility]/ [] Home Care/ [] Refused Recommended Disposition /[] [facility]/ []  Follow-up with PCP Additional Notes: called office and pt was worked in for 1540 with Dr Carlynn Purl.  Reason for Disposition  MODERATE to SEVERE itching (e.g., interferes with work, school, sleep, or other activities)  Answer Assessment - Initial Assessment Questions 1. APPEARANCE of RASH: "Describe the rash."      Red, whelps , mild swelling  2. LOCATION: "Where is the rash located?"  (e.g., face, genitals, hands, legs)     Between wrist and elbow both arms  3. SIZE: "How large is the rash?"      Anterior 2/3 opf arms  4. ONSET: "When did the rash begin?"      Sunday  5. ITCHING: "Does the rash itch?" If Yes, ask: "How bad is it?"   - MILD - doesn't interfere with normal activities   - MODERATE-SEVERE: interferes with work, school, sleep, or other activities      Mild  6. EXPOSURE:  "How were you exposed to the plant (poison ivy, poison oak, sumac)"  "When were you exposed?"       Poison oak Sat 7. PAST HISTORY: "Have you had a poison ivy rash before?" If Yes, ask: "How bad was it?"     yes  Protocols used: Poison Ivy - Oak - Sumac-A-AH

## 2022-12-09 ENCOUNTER — Ambulatory Visit (INDEPENDENT_AMBULATORY_CARE_PROVIDER_SITE_OTHER): Payer: PRIVATE HEALTH INSURANCE | Admitting: Family Medicine

## 2022-12-09 ENCOUNTER — Encounter: Payer: Self-pay | Admitting: Family Medicine

## 2022-12-09 VITALS — BP 128/80 | HR 68 | Temp 97.9°F | Resp 16 | Ht 65.0 in | Wt 204.0 lb

## 2022-12-09 DIAGNOSIS — R21 Rash and other nonspecific skin eruption: Secondary | ICD-10-CM

## 2022-12-09 MED ORDER — HYDROXYZINE PAMOATE 25 MG PO CAPS
25.0000 mg | ORAL_CAPSULE | Freq: Three times a day (TID) | ORAL | 0 refills | Status: DC | PRN
Start: 2022-12-09 — End: 2023-11-30

## 2022-12-09 MED ORDER — PREDNISONE 10 MG PO TABS
ORAL_TABLET | ORAL | 0 refills | Status: DC
Start: 2022-12-09 — End: 2023-01-20

## 2022-12-09 MED ORDER — LORATADINE 10 MG PO TABS
10.0000 mg | ORAL_TABLET | Freq: Every day | ORAL | 11 refills | Status: DC
Start: 2022-12-09 — End: 2023-03-05

## 2022-12-09 NOTE — Patient Instructions (Addendum)
Start taking a daily 24 hour antihistamine like claritin or zyrtec for the next couple weeks Try to take the steroids again and come in Friday for [place] to check your side effects and wheezing Use the hydroxyzine as needed for severe itching.  If this doesn't go away in the next 2 weeks please follow up with [place] so we can see if you need to be referred to a specialist

## 2022-12-09 NOTE — Progress Notes (Signed)
Patient ID: Tamara Nelson, female    DOB: 27-Nov-1965, 57 y.o.   MRN: [id number]  PCP: Alba Cory, MD  Chief Complaint  Patient presents with   Rash    Chest, stomach, arms and legs on going rash, already seen Dr.Sowles its getting worst. Red and itchy    Subjective:   Tamara Nelson is a 57 y.o. female, presents to clinic with CC of the following:  HPI  Pt presents for worsening rash despite being on a steroid taper pack for the last week. - she states she only took a few days of the prednisone - she felt like she noticed a wheeze after she started taking it so she stopped after a few days She saw Dr. Carlynn Purl on 7/30 had presented with likely poison oak exposure. No on any other medications for itching/rash No new soaps, detergents, foods, medicines     Patient Active Problem List   Diagnosis Date Noted   Perennial allergic rhinitis with seasonal variation 02/06/2022   Hypertension, benign 02/06/2022   Diverticulosis 03/17/2018   Nephrolithiasis 03/17/2018   B12 deficiency 10/23/2015   Dyslipidemia 10/21/2015   Bee allergy status 10/17/2014   Abnormal serum level of alkaline phosphatase 10/17/2014   Menopause 10/17/2014   Dysmetabolic syndrome 10/17/2014   Obesity (BMI 30-39.9) 10/17/2014   Vitamin D deficiency 10/17/2014      Current Outpatient Medications:    ammonium lactate (LAC-HYDRIN) 12 % cream, Apply topically as needed for dry skin., Disp: 385 g, Rfl: 0   Cholecalciferol (VITAMIN D) 2000 UNITS CAPS, Take 1 capsule by mouth as needed., Disp: , Rfl:    desonide (DESOWEN) 0.05 % cream, Apply topically 2 (two) times daily., Disp: 30 g, Rfl: 0   EPINEPHrine (EPIPEN 2-PAK) 0.3 mg/0.3 mL IJ SOAJ injection, Inject 0.3 mg into the muscle as needed for anaphylaxis., Disp: 2 each, Rfl: 1   loratadine (CLARITIN) 10 MG tablet, TAKE 1 TABLET BY MOUTH TWICE A DAY, Disp: 60 tablet, Rfl: 0   losartan (COZAAR) 25 MG tablet, Take 0.5 tablets (12.5 mg total) by  mouth daily., Disp: 45 tablet, Rfl: 1   predniSONE (DELTASONE) 10 MG tablet, Take 1 tablet (10 mg total) by mouth as directed. 6x2 days, 5 x 2days,4x2days,3x2day,2x2days and 1x2days, Disp: 42 tablet, Rfl: 0   triamcinolone cream (KENALOG) 0.1 %, Apply topically 2 (two) times daily., Disp: 80 g, Rfl: 1   Allergies  Allergen Reactions   Bee Venom    Mixed Vespid Venom     Other reaction(s): Unknown     Social History   Tobacco Use   Smoking status: Never   Smokeless tobacco: Never  Vaping Use   Vaping status: Never Used  Substance Use Topics   Alcohol use: No    Alcohol/week: 0.0 standard drinks of alcohol   Drug use: No      Chart Review Today: I personally reviewed active problem list, medication list, allergies, family history, social history, health maintenance, notes from last encounter, lab results, imaging with the patient/caregiver today.   Review of Systems  Constitutional: Negative.   HENT: Negative.    Eyes: Negative.   Respiratory: Negative.    Cardiovascular: Negative.   Gastrointestinal: Negative.   Endocrine: Negative.   Genitourinary: Negative.   Musculoskeletal: Negative.   Skin: Negative.   Allergic/Immunologic: Negative.   Neurological: Negative.   Hematological: Negative.   Psychiatric/Behavioral: Negative.    All other systems reviewed and are negative.  Objective:   Vitals:   12/09/22 0817  BP: 128/80  Pulse: 68  Resp: 16  Temp: 97.9 F (36.6 C)  TempSrc: Oral  SpO2: 97%  Weight: 204 lb (92.5 kg)  Height: 5\' 5"  (1.651 m)    Body mass index is 33.95 kg/m.  Physical Exam Vitals and nursing note reviewed.  Constitutional:      General: She is not in acute distress.    Appearance: Normal appearance. She is well-developed. She is obese. She is not ill-appearing, toxic-appearing or diaphoretic.  HENT:     Head: Normocephalic and atraumatic.     Nose: Nose normal.  Eyes:     General: No scleral icterus.       Right eye: No  discharge.        Left eye: No discharge.     Conjunctiva/sclera: Conjunctivae normal.  Neck:     Trachea: No tracheal deviation.  Cardiovascular:     Rate and Rhythm: Normal rate and regular rhythm.     Pulses: Normal pulses.     Heart sounds: Normal heart sounds.  Pulmonary:     Effort: Pulmonary effort is normal. No respiratory distress.     Breath sounds: Normal breath sounds. No stridor. No wheezing, rhonchi or rales.  Musculoskeletal:        General: Normal range of motion.  Skin:    General: Skin is warm and dry.     Findings: Rash present.     Comments: Scattered erythematous rash to extremities, hands, trunk  Neurological:     Mental Status: She is alert.     Motor: No abnormal muscle tone.     Coordination: Coordination normal.  Psychiatric:        Behavior: Behavior normal.      Results for orders placed or performed in visit on 02/06/22  Lipid panel  Result Value Ref Range   Cholesterol 199 <200 mg/dL   HDL 41 (L) > OR = 50 mg/dL   Triglycerides 161 <096 mg/dL   LDL Cholesterol (Calc) 138 (H) mg/dL (calc)   Total CHOL/HDL Ratio 4.9 <5.0 (calc)   Non-HDL Cholesterol (Calc) 158 (H) <130 mg/dL (calc)  E45 and Folate Panel  Result Value Ref Range   Vitamin B-12 653 200 - 1,100 pg/mL   Folate 20.4 ng/mL  COMPLETE METABOLIC PANEL WITH GFR  Result Value Ref Range   Glucose, Bld 96 65 - 99 mg/dL   BUN 11 7 - 25 mg/dL   Creat 4.09 8.11 - 9.14 mg/dL   eGFR 81 > OR = 60 NW/GNF/6.21H0   BUN/Creatinine Ratio SEE NOTE: 6 - 22 (calc)   Sodium 137 135 - 146 mmol/L   Potassium 4.0 3.5 - 5.3 mmol/L   Chloride 103 98 - 110 mmol/L   CO2 23 20 - 32 mmol/L   Calcium 9.8 8.6 - 10.4 mg/dL   Total Protein 7.5 6.1 - 8.1 g/dL   Albumin 4.4 3.6 - 5.1 g/dL   Globulin 3.1 1.9 - 3.7 g/dL (calc)   AG Ratio 1.4 1.0 - 2.5 (calc)   Total Bilirubin 0.5 0.2 - 1.2 mg/dL   Alkaline phosphatase (APISO) 134 37 - 153 U/L   AST 17 10 - 35 U/L   ALT 20 6 - 29 U/L  CBC with  Differential/Platelet  Result Value Ref Range   WBC 8.8 3.8 - 10.8 Thousand/uL   RBC 4.59 3.80 - 5.10 Million/uL   Hemoglobin 13.6 11.7 - 15.5 g/dL   HCT 86.5 78.4 -  45.0 %   MCV 86.9 80.0 - 100.0 fL   MCH 29.6 27.0 - 33.0 pg   MCHC 34.1 32.0 - 36.0 g/dL   RDW 16.1 09.6 - 04.5 %   Platelets 303 140 - 400 Thousand/uL   MPV 9.9 7.5 - 12.5 fL   Neutro Abs 5,122 1,500 - 7,800 cells/uL   Lymphs Abs 3,010 850 - 3,900 cells/uL   Absolute Monocytes 537 200 - 950 cells/uL   Eosinophils Absolute 88 15 - 500 cells/uL   Basophils Absolute 44 0 - 200 cells/uL   Neutrophils Relative % 58.2 %   Total Lymphocyte 34.2 %   Monocytes Relative 6.1 %   Eosinophils Relative 1.0 %   Basophils Relative 0.5 %  VITAMIN D 25 Hydroxy (Vit-D Deficiency, Fractures)  Result Value Ref Range   Vit D, 25-Hydroxy 27 (L) 30 - 100 ng/mL  Hemoglobin A1c  Result Value Ref Range   Hgb A1c MFr Bld 5.9 (H) <5.7 % of total Hgb   Mean Plasma Glucose 123 mg/dL   eAG (mmol/L) 6.8 mmol/L       Assessment & Plan:     ICD-10-CM   1. Rash and nonspecific skin eruption  R21 loratadine (CLARITIN) 10 MG tablet    hydrOXYzine (VISTARIL) 25 MG capsule    predniSONE (DELTASONE) 10 MG tablet     Suspect an allergic or contact dermatitis - pt encouraged to restart prednisone, take daily antihistamine for a few weeks, can use hydroxyzine prn for severe itching  I offered to check her Friday in clinic to ensure she is not having wheeze from the steroid I would expect the rash to improve with plan above Underlying etiology or initial irritant unknown Airway intact, no wheeze on exam      Danelle Berry, PA-C 12/09/22 8:26 AM

## 2023-01-20 NOTE — Progress Notes (Deleted)
Name: Tamara Nelson   MRN: [id number]    DOB: 11-21-1965   Date:01/21/2023       Progress Note  Subjective  Chief Complaint  Annual Exam  HPI  Patient presents for annual CPE.  Diet: she cooks at home, when she goes out she tries to choose grilled chicken sandwiches Exercise:  works in her yard every Saturday for hours during the Summer Last Eye Exam: she is due for an exam  Last Dental Exam: up to date   [employer] Visit from 01/21/2023 in [facility]  AUDIT-C Score 0      Depression: Phq 9 is  negative    01/21/2023    1:44 PM 12/09/2022    8:17 AM 12/01/2022    3:24 PM 08/14/2022    2:47 PM 03/16/2022    2:30 PM  Depression screen PHQ 2/9  Decreased Interest 0 0 0 0 0  Down, Depressed, Hopeless 0 0 0 0 0  PHQ - 2 Score 0 0 0 0 0  Altered sleeping 0 0  0 0  Tired, decreased energy 0 0  0 0  Change in appetite 0 0  0 0  Feeling bad or failure about yourself  0 0  0 0  Trouble concentrating 0 0  0 0  Moving slowly or fidgety/restless 0 0  0 0  Suicidal thoughts 0 0  0 0  PHQ-9 Score 0 0  0 0  Difficult doing work/chores Not difficult at all Not difficult at all      Hypertension: BP Readings from Last 3 Encounters:  01/21/23 112/74  12/09/22 128/80  12/01/22 128/80   Obesity: Wt Readings from Last 3 Encounters:  01/21/23 203 lb 14.4 oz (92.5 kg)  12/09/22 204 lb (92.5 kg)  12/01/22 211 lb (95.7 kg)   BMI Readings from Last 3 Encounters:  01/21/23 33.93 kg/m  12/09/22 33.95 kg/m  12/01/22 35.11 kg/m     Vaccines:   Tdap: up to date Shingrix: up to date Pneumonia: N/A Flu: today  COVID-19: up to date   Hep C Screening: 01/23/19 STD testing and prevention (HIV/chl/gon/syphilis): 10/21/15 Intimate partner violence: negative screen  Sexual History : not sexually active  Menstrual History/LMP/Abnormal Bleeding: post-menopausal for years  Discussed importance of follow up if any post-menopausal bleeding: yes   Incontinence Symptoms: negative for symptoms   Breast cancer:  - Last Mammogram: Scheduled for 04/05/23 - BRCA gene screening: N/A  Osteoporosis Prevention : Discussed high calcium and vitamin D supplementation, weight bearing exercises Bone density: N/A   Cervical cancer screening: 12/14/17  Skin cancer: Discussed monitoring for atypical lesions  Colorectal cancer: 11/08/20   Lung cancer:  Low Dose CT Chest recommended if Age 60-80 years, 20 pack-year currently smoking OR have quit w/in 15years. Patient does not qualify for screen   ECG: 06/07/20  Advanced Care Planning: A voluntary discussion about advance care planning including the explanation and discussion of advance directives.  Discussed health care proxy and Living will, and the patient was able to identify a health care proxy as sister   Patient does not have a living will and power of attorney of health care   Lipids: Lab Results  Component Value Date   CHOL 199 02/06/2022   CHOL 179 01/31/2021   CHOL 184 01/29/2020   Lab Results  Component Value Date   HDL 41 (L) 02/06/2022   HDL 46 (L) 01/31/2021   HDL 46 (L) 01/29/2020  Lab Results  Component Value Date   LDLCALC 138 (H) 02/06/2022   LDLCALC 110 (H) 01/31/2021   LDLCALC 120 (H) 01/29/2020   Lab Results  Component Value Date   TRIG 102 02/06/2022   TRIG 119 01/31/2021   TRIG 83 01/29/2020   Lab Results  Component Value Date   CHOLHDL 4.9 02/06/2022   CHOLHDL 3.9 01/31/2021   CHOLHDL 4.0 01/29/2020   No results found for: "LDLDIRECT"  Glucose: Glucose, Bld  Date Value Ref Range Status  02/06/2022 96 65 - 99 mg/dL Final    Comment:    .            Fasting reference interval .   08/26/2021 194 (H) 70 - 99 mg/dL Final    Comment:    Glucose reference range applies only to samples taken after fasting for at least 8 hours.  06/07/2020 93 65 - 99 mg/dL Final    Comment:    .            Fasting reference interval .     Patient Active Problem  List   Diagnosis Date Noted   Perennial allergic rhinitis with seasonal variation 02/06/2022   Hypertension, benign 02/06/2022   Diverticulosis 03/17/2018   Nephrolithiasis 03/17/2018   B12 deficiency 10/23/2015   Dyslipidemia 10/21/2015   Bee allergy status 10/17/2014   Abnormal serum level of alkaline phosphatase 10/17/2014   Menopause 10/17/2014   Dysmetabolic syndrome 10/17/2014   Obesity (BMI 30-39.9) 10/17/2014   Vitamin D deficiency 10/17/2014    Past Surgical History:  Procedure Laterality Date   BREAST BIOPSY Right 04/21/2022   [place] RT BREAST BX W LOC DEV 1ST LESION IMG BX SPEC US GUIDE 04/21/2022 [facility]   COLONOSCOPY WITH PROPOFOL N/A 11/08/2020   Procedure: COLONOSCOPY WITH PROPOFOL;  Surgeon: Midge Minium, MD;  [facility];  Service: Endoscopy;  Laterality: N/A;    Family History  Problem Relation Age of Onset   Cancer Mother        colon   Cancer Father        Liver and Lung   Stroke Father    Osteoarthritis Sister    Hypertension Brother    Diabetes Brother    Kidney cancer Brother    Diabetes Brother    Vascular Disease Brother    Breast cancer Neg Hx     Social History   Socioeconomic History   Marital status: Single    Spouse name: Not on file   Number of children: 0   Years of education: Not on file   Highest education level: High school graduate  Occupational History   Occupation: Research scientist (medical)     Employer: [employer]  Tobacco Use   Smoking status: Never   Smokeless tobacco: Never  Vaping Use   Vaping status: Never Used  Substance and Sexual Activity   Alcohol use: No    Alcohol/week: 0.0 standard drinks of alcohol   Drug use: No   Sexual activity: Never  Other Topics Concern   Not on file  Social History Narrative   Lives alone, works at [employer] during the day and for [employer] at night, 5 nights weekly    Social Determinants of Health   Financial Resource Strain: Low Risk   (01/21/2023)   Overall Financial Resource Strain (CARDIA)    Difficulty of Paying Living Expenses: Not hard at all  Food Insecurity: No Food Insecurity (01/21/2023)   Hunger Vital Sign  Worried About Programme researcher, broadcasting/film/video in the Last Year: Never true    Ran Out of Food in the Last Year: Never true  Transportation Needs: No Transportation Needs (01/21/2023)   PRAPARE - Administrator, Civil Service (Medical): No    Lack of Transportation (Non-Medical): No  Physical Activity: Insufficiently Active (01/21/2023)   Exercise Vital Sign    Days of Exercise per Week: 5 days    Minutes of Exercise per Session: 20 min  Stress: Stress Concern Present (01/21/2023)   [employer] of Occupational Health - Occupational Stress Questionnaire    Feeling of Stress : To some extent  Social Connections: Socially Isolated (01/21/2023)   Social Connection and Isolation Panel [NHANES]    Frequency of Communication with Friends and Family: More than three times a week    Frequency of Social Gatherings with Friends and Family: More than three times a week    Attends Religious Services: Never    Database administrator or Organizations: No    Attends Banker Meetings: Never    Marital Status: Never married  Intimate Partner Violence: Not At Risk (01/21/2023)   Humiliation, Afraid, Rape, and Kick questionnaire    Fear of Current or Ex-Partner: No    Emotionally Abused: No    Physically Abused: No    Sexually Abused: No     Current Outpatient Medications:    ammonium lactate (LAC-HYDRIN) 12 % cream, Apply topically as needed for dry skin., Disp: 385 g, Rfl: 0   Cholecalciferol (VITAMIN D) 2000 UNITS CAPS, Take 1 capsule by mouth as needed., Disp: , Rfl:    desonide (DESOWEN) 0.05 % cream, Apply topically 2 (two) times daily., Disp: 30 g, Rfl: 0   EPINEPHrine (EPIPEN 2-PAK) 0.3 mg/0.3 mL IJ SOAJ injection, Inject 0.3 mg into the muscle as needed for anaphylaxis., Disp: 2 each, Rfl: 1    hydrOXYzine (VISTARIL) 25 MG capsule, Take 1 capsule (25 mg total) by mouth every 8 (eight) hours as needed., Disp: 30 capsule, Rfl: 0   loratadine (CLARITIN) 10 MG tablet, TAKE 1 TABLET BY MOUTH TWICE A DAY, Disp: 60 tablet, Rfl: 0   loratadine (CLARITIN) 10 MG tablet, Take 1 tablet (10 mg total) by mouth daily., Disp: 30 tablet, Rfl: 11   losartan (COZAAR) 25 MG tablet, Take 0.5 tablets (12.5 mg total) by mouth daily., Disp: 45 tablet, Rfl: 1   triamcinolone cream (KENALOG) 0.1 %, Apply topically 2 (two) times daily., Disp: 80 g, Rfl: 1  Allergies  Allergen Reactions   Bee Venom    Mixed Vespid Venom     Other reaction(s): Unknown     ROS  Constitutional: Negative for fever or weight change.  Respiratory: Negative for cough and shortness of breath.   Cardiovascular: Negative for chest pain or palpitations.  Gastrointestinal: Negative for abdominal pain, she states developed constipation in July but is now taking probiotics and has a bowel movements daily now, no blood in stools , discussed evaluation by GI but she would like to hold off since feeling well now  Musculoskeletal: Negative for gait problem or joint swelling.  Skin: Negative for rash.  Neurological: Negative for dizziness or headache.  No other specific complaints in a complete review of systems (except as listed in HPI above).   Objective  Vitals:   01/21/23 1348  BP: 112/74  Pulse: 79  Resp: 16  Temp: 98.1 F (36.7 C)  TempSrc: Oral  SpO2: 95%  Weight: 203  lb 14.4 oz (92.5 kg)  Height: 5\' 5"  (1.651 m)    Body mass index is 33.93 kg/m.  Physical Exam  Constitutional: Patient appears well-developed and well-nourished. No distress.  HENT: Head: Normocephalic and atraumatic. Ears: B TMs ok, no erythema or effusion; Nose: Nose normal. Mouth/Throat: Oropharynx is clear and moist. No oropharyngeal exudate.  Eyes: Conjunctivae and EOM are normal. Pupils are equal, round, and reactive to light. No scleral  icterus.  Neck: Normal range of motion. Neck supple. No JVD present. No thyromegaly present.  Cardiovascular: Normal rate, regular rhythm and normal heart sounds.  No murmur heard. No BLE edema. Pulmonary/Chest: Effort normal and breath sounds normal. No respiratory distress. Abdominal: Soft. Bowel sounds are normal, no distension. There is no tenderness. no masses Breast: no lumps or masses, no nipple discharge or rashes FEMALE GENITALIA:  External genitalia normal External urethra normal Vaginal vault normal without discharge or lesions Cervix normal without discharge or lesions Bimanual exam normal without masses RECTAL: not done  Musculoskeletal: Normal range of motion, no joint effusions. No gross deformities Neurological: he is alert and oriented to person, place, and time. No cranial nerve deficit. Coordination, balance, strength, speech and gait are normal.  Skin: Skin is warm and dry. No rash noted. No erythema.  Psychiatric: Patient has a normal mood and affect. behavior is normal. Judgment and thought content normal.   Fall Risk:    01/21/2023    1:44 PM 12/09/2022    8:17 AM 12/01/2022    3:23 PM 08/14/2022    2:47 PM 03/16/2022    2:30 PM  Fall Risk   Falls in the past year? 0 0 0 0 0  Number falls in past yr: 0 0 0  0  Injury with Fall? 0 0 0  0  Risk for fall due to : No Fall Risks No Fall Risks No Fall Risks No Fall Risks No Fall Risks  Follow up Falls prevention discussed;Education provided;Falls evaluation completed Falls prevention discussed;Education provided;Falls evaluation completed Falls prevention discussed Falls prevention discussed Falls prevention discussed     Functional Status Survey: Is the patient deaf or have difficulty hearing?: No Does the patient have difficulty seeing, even when wearing glasses/contacts?: No Does the patient have difficulty concentrating, remembering, or making decisions?: No Does the patient have difficulty walking or climbing  stairs?: No Does the patient have difficulty dressing or bathing?: No Does the patient have difficulty doing errands alone such as visiting a doctor's office or shopping?: No   Assessment & Plan  1. Well adult exam  - Cytology - PAP - Lipid panel - CBC with Differential/Platelet - COMPLETE METABOLIC PANEL WITH GFR - Hemoglobin A1c - VITAMIN D 25 Hydroxy (Vit-D Deficiency, Fractures) - B12 and Folate Panel  2. Cervical cancer screening  - Cytology - PAP  3. Need for immunization against influenza  - Flu vaccine trivalent PF, 6mos and older(Flulaval,Afluria,Fluarix,Fluzone)  4. Dyslipidemia  - Lipid panel  5. Vitamin D deficiency  - VITAMIN D 25 Hydroxy (Vit-D Deficiency, Fractures)  6. B12 deficiency  - CBC with Differential/Platelet - B12 and Folate Panel  7. Dysmetabolic syndrome  - Hemoglobin A1c  8. Hypertension, benign  - COMPLETE METABOLIC PANEL WITH GFR    -USPSTF grade A and B recommendations reviewed with patient; age-appropriate recommendations, preventive care, screening tests, etc discussed and encouraged; healthy living encouraged; see AVS for patient education given to patient -Discussed importance of 150 minutes of physical activity weekly, eat two servings of fish  weekly, eat one serving of tree nuts ( cashews, pistachios, pecans, almonds.Marland Kitchen) every other day, eat 6 servings of fruit/vegetables daily and drink plenty of water and avoid sweet beverages.   -Reviewed Health Maintenance: Yes.

## 2023-01-21 ENCOUNTER — Ambulatory Visit (INDEPENDENT_AMBULATORY_CARE_PROVIDER_SITE_OTHER): Payer: PRIVATE HEALTH INSURANCE | Admitting: Family Medicine

## 2023-01-21 ENCOUNTER — Encounter: Payer: Self-pay | Admitting: Family Medicine

## 2023-01-21 ENCOUNTER — Other Ambulatory Visit: Payer: Self-pay

## 2023-01-21 ENCOUNTER — Other Ambulatory Visit (HOSPITAL_COMMUNITY)
Admission: RE | Admit: 2023-01-21 | Discharge: 2023-01-21 | Disposition: A | Discharge status: Home / Self Care | Payer: PRIVATE HEALTH INSURANCE | Source: Ambulatory Visit | Attending: Family Medicine | Admitting: Family Medicine

## 2023-01-21 VITALS — BP 112/74 | HR 79 | Temp 98.1°F | Resp 16 | Ht 65.0 in | Wt 203.9 lb

## 2023-01-21 DIAGNOSIS — E559 Vitamin D deficiency, unspecified: Secondary | ICD-10-CM | POA: Diagnosis not present

## 2023-01-21 DIAGNOSIS — E785 Hyperlipidemia, unspecified: Secondary | ICD-10-CM

## 2023-01-21 DIAGNOSIS — Z124 Encounter for screening for malignant neoplasm of cervix: Secondary | ICD-10-CM

## 2023-01-21 DIAGNOSIS — Z23 Encounter for immunization: Secondary | ICD-10-CM

## 2023-01-21 DIAGNOSIS — E538 Deficiency of other specified B group vitamins: Secondary | ICD-10-CM

## 2023-01-21 DIAGNOSIS — E8881 Metabolic syndrome: Secondary | ICD-10-CM

## 2023-01-21 DIAGNOSIS — Z0000 Encounter for general adult medical examination without abnormal findings: Secondary | ICD-10-CM

## 2023-01-21 DIAGNOSIS — I10 Essential (primary) hypertension: Secondary | ICD-10-CM

## 2023-01-21 DIAGNOSIS — Z0001 Encounter for general adult medical examination with abnormal findings: Secondary | ICD-10-CM

## 2023-01-21 DIAGNOSIS — Z91030 Bee allergy status: Secondary | ICD-10-CM

## 2023-01-21 MED ORDER — EPINEPHRINE 0.3 MG/0.3ML IJ SOAJ: 0.3000 mg | INTRAMUSCULAR | 1 refills | Status: DC | PRN

## 2023-01-22 ENCOUNTER — Encounter: Payer: BC Managed Care – PPO | Admitting: Family Medicine

## 2023-01-22 LAB — COMPLETE METABOLIC PANEL WITH GFR
AG Ratio: 1.6 (calc) (ref 1.0–2.5)
ALT: 16 U/L (ref 6–29)
AST: 16 U/L (ref 10–35)
Albumin: 4.3 g/dL (ref 3.6–5.1)
Alkaline phosphatase (APISO): 142 U/L (ref 37–153)
BUN: 14 mg/dL (ref 7–25)
CO2: 23 mmol/L (ref 20–32)
Calcium: 9.5 mg/dL (ref 8.6–10.4)
Chloride: 104 mmol/L (ref 98–110)
Creat: 0.72 mg/dL (ref 0.50–1.03)
Globulin: 2.7 g/dL (calc) (ref 1.9–3.7)
Glucose, Bld: 96 mg/dL (ref 65–99)
Potassium: 3.9 mmol/L (ref 3.5–5.3)
Sodium: 139 mmol/L (ref 135–146)
Total Bilirubin: 0.5 mg/dL (ref 0.2–1.2)
Total Protein: 7 g/dL (ref 6.1–8.1)
eGFR: 98 mL/min/{1.73_m2} (ref >=60)

## 2023-01-22 LAB — CBC WITH DIFFERENTIAL/PLATELET
Absolute Monocytes: 467 cells/uL (ref 200–950)
Basophils Absolute: 16 cells/uL (ref 0–200)
Basophils Relative: 0.2 %
Eosinophils Absolute: 82 cells/uL (ref 15–500)
Eosinophils Relative: 1 %
HCT: 41.6 % (ref 35.0–45.0)
Hemoglobin: 13.6 g/dL (ref 11.7–15.5)
Lymphs Abs: 2567 cells/uL (ref 850–3900)
MCH: 29 pg (ref 27.0–33.0)
MCHC: 32.7 g/dL (ref 32.0–36.0)
MCV: 88.7 fL (ref 80.0–100.0)
MPV: 10.5 fL (ref 7.5–12.5)
Monocytes Relative: 5.7 %
Neutro Abs: 5068 cells/uL (ref 1500–7800)
Neutrophils Relative %: 61.8 %
Platelets: 278 10*3/uL (ref 140–400)
RBC: 4.69 10*6/uL (ref 3.80–5.10)
RDW: 13.7 % (ref 11.0–15.0)
Total Lymphocyte: 31.3 %
WBC: 8.2 10*3/uL (ref 3.8–10.8)

## 2023-01-22 LAB — HEMOGLOBIN A1C
Hgb A1c MFr Bld: 6.1 % of total Hgb — ABNORMAL HIGH (ref <5.7)
Mean Plasma Glucose: 128 mg/dL
eAG (mmol/L): 7.1 mmol/L

## 2023-01-22 LAB — VITAMIN D 25 HYDROXY (VIT D DEFICIENCY, FRACTURES): Vit D, 25-Hydroxy: 34 ng/mL (ref 30–100)

## 2023-01-22 LAB — LIPID PANEL
Cholesterol: 176 mg/dL (ref <200)
HDL: 44 mg/dL — ABNORMAL LOW (ref >=50)
LDL Cholesterol (Calc): 112 mg/dL (calc) — ABNORMAL HIGH
Non-HDL Cholesterol (Calc): 132 mg/dL (calc) — ABNORMAL HIGH (ref <130)
Total CHOL/HDL Ratio: 4 (calc) (ref <5.0)
Triglycerides: 97 mg/dL (ref <150)

## 2023-01-22 LAB — B12 AND FOLATE PANEL
Folate: 9.9 ng/mL
Vitamin B-12: 419 pg/mL (ref 200–1100)

## 2023-01-22 NOTE — Progress Notes (Signed)
Expand All Collapse All Name: Tamara Nelson   MRN: [id number]    DOB: [birth year]   Date:01/21/2023                                                               Progress Note   Subjective   Chief Complaint   Annual Exam   HPI   Patient presents for annual CPE.   Diet: she cooks at home, when she goes out she tries to choose grilled chicken sandwiches Exercise:  works in her yard every Saturday for hours during the Summer Last Eye Exam: she is due for an exam  Last Dental Exam: up to date    [employer] Visit from 01/21/2023 in [facility]  AUDIT-C Score 0         Depression: Phq 9 is  negative     01/21/2023    1:44 PM 12/09/2022    8:17 AM 12/01/2022    3:24 PM 08/14/2022    2:47 PM 03/16/2022    2:30 PM  Depression screen PHQ 2/9  Decreased Interest 0 0 0 0 0  Down, Depressed, Hopeless 0 0 0 0 0  PHQ - 2 Score 0 0 0 0 0  Altered sleeping 0 0   0 0  Tired, decreased energy 0 0   0 0  Change in appetite 0 0   0 0  Feeling bad or failure about yourself  0 0   0 0  Trouble concentrating 0 0   0 0  Moving slowly or fidgety/restless 0 0   0 0  Suicidal thoughts 0 0   0 0  PHQ-9 Score 0 0   0 0  Difficult doing work/chores Not difficult at all Not difficult at all          Hypertension:    BP Readings from Last 3 Encounters:  01/21/23 112/74  12/09/22 128/80  12/01/22 128/80    Obesity:    Wt Readings from Last 3 Encounters:  01/21/23 203 lb 14.4 oz (92.5 kg)  12/09/22 204 lb (92.5 kg)  12/01/22 211 lb (95.7 kg)       BMI Readings from Last 3 Encounters:  01/21/23 33.93 kg/m  12/09/22 33.95 kg/m  12/01/22 35.11 kg/m      Vaccines:    Tdap: up to date Shingrix: up to date Pneumonia: N/A Flu: today  COVID-19: up to date     Hep C Screening: 01/23/19 STD testing and prevention (HIV/chl/gon/syphilis): 10/21/15 Intimate partner violence: negative screen  Sexual History : not sexually active  Menstrual  History/LMP/Abnormal Bleeding: post-menopausal for years  Discussed importance of follow up if any post-menopausal bleeding: yes  Incontinence Symptoms: negative for symptoms    Breast cancer:  - Last Mammogram: Scheduled for 04/05/23 - BRCA gene screening: N/A   Osteoporosis Prevention : Discussed high calcium and vitamin D supplementation, weight bearing exercises Bone density: N/A    Cervical cancer screening: 12/14/17   Skin cancer: Discussed monitoring for atypical lesions  Colorectal cancer: 11/08/20   Lung cancer:  Low Dose CT Chest recommended if Age 31-80 years, 20 pack-year currently smoking OR have quit w/in 15years. Patient does not qualify for screen   ECG: 06/07/20   Advanced Care Planning: A  voluntary discussion about advance care planning including the explanation and discussion of advance directives.  Discussed health care proxy and Living will, and the patient was able to identify a health care proxy as sister   Patient does not have a living will and power of attorney of health care    Lipids: Recent Labs       Lab Results  Component Value Date    CHOL 199 02/06/2022    CHOL 179 01/31/2021    CHOL 184 01/29/2020      Recent Labs       Lab Results  Component Value Date    HDL 41 (L) 02/06/2022    HDL 46 (L) 01/31/2021    HDL 46 (L) 01/29/2020      Recent Labs       Lab Results  Component Value Date    LDLCALC 138 (H) 02/06/2022    LDLCALC 110 (H) 01/31/2021    LDLCALC 120 (H) 01/29/2020      Recent Labs       Lab Results  Component Value Date    TRIG 102 02/06/2022    TRIG 119 01/31/2021    TRIG 83 01/29/2020      Recent Labs       Lab Results  Component Value Date    CHOLHDL 4.9 02/06/2022    CHOLHDL 3.9 01/31/2021    CHOLHDL 4.0 01/29/2020      Recent Labs  No results found for: "LDLDIRECT"     Glucose: Last Labs         Glucose, Bld  Date Value Ref Range Status  02/06/2022 96 65 - 99 mg/dL Final      Comment:      .             Fasting reference interval .    08/26/2021 194 (H) 70 - 99 mg/dL Final      Comment:      Glucose reference range applies only to samples taken after fasting for at least 8 hours.  06/07/2020 93 65 - 99 mg/dL Final      Comment:      .            Fasting reference interval .              Patient Active Problem List    Diagnosis Date Noted   Perennial allergic rhinitis with seasonal variation 02/06/2022   Hypertension, benign 02/06/2022   Diverticulosis 03/17/2018   Nephrolithiasis 03/17/2018   B12 deficiency 10/23/2015   Dyslipidemia 10/21/2015   Bee allergy status 10/17/2014   Abnormal serum level of alkaline phosphatase 10/17/2014   Menopause 10/17/2014   Dysmetabolic syndrome 10/17/2014   Obesity (BMI 30-39.9) 10/17/2014   Vitamin D deficiency 10/17/2014           Past Surgical History:  Procedure Laterality Date   BREAST BIOPSY Right 04/21/2022    [place] RT BREAST BX W LOC DEV 1ST LESION IMG BX SPEC US GUIDE 04/21/2022 [facility]   COLONOSCOPY WITH PROPOFOL N/A 11/08/2020    Procedure: COLONOSCOPY WITH PROPOFOL;  Surgeon: Midge Minium, MD;  Location: [facility];  Service: Endoscopy;  Laterality: N/A;               Family History  Problem Relation Age of Onset   Cancer Mother          colon   Cancer Father          Liver and Lung  Stroke Father     Osteoarthritis Sister     Hypertension Brother     Diabetes Brother     Kidney cancer Brother     Diabetes Brother     Vascular Disease Brother     Breast cancer Neg Hx            Social History         Socioeconomic History   Marital status: Single      Spouse name: Not on file   Number of children: 0   Years of education: Not on file   Highest education level: High school graduate  Occupational History   Occupation: Research scientist (medical)       Employer: [employer]  Tobacco Use   Smoking status: Never   Smokeless tobacco: Never  Vaping Use   Vaping status: Never Used   Substance and Sexual Activity   Alcohol use: No      Alcohol/week: 0.0 standard drinks of alcohol   Drug use: No   Sexual activity: Never  Other Topics Concern   Not on file  Social History Narrative    Lives alone, works at [employer] during the day and for [employer] at night, 5 nights weekly     Social Determinants of Health        Financial Resource Strain: Low Risk  (01/21/2023)    Overall Financial Resource Strain (CARDIA)     Difficulty of Paying Living Expenses: Not hard at all  Food Insecurity: No Food Insecurity (01/21/2023)    Hunger Vital Sign     Worried About Running Out of Food in the Last Year: Never true     Ran Out of Food in the Last Year: Never true  Transportation Needs: No Transportation Needs (01/21/2023)    PRAPARE - Therapist, art (Medical): No     Lack of Transportation (Non-Medical): No  Physical Activity: Insufficiently Active (01/21/2023)    Exercise Vital Sign     Days of Exercise per Week: 5 days     Minutes of Exercise per Session: 20 min  Stress: Stress Concern Present (01/21/2023)    [employer] of Occupational Health - Occupational Stress Questionnaire     Feeling of Stress : To some extent  Social Connections: Socially Isolated (01/21/2023)    Social Connection and Isolation Panel [NHANES]     Frequency of Communication with Friends and Family: More than three times a week     Frequency of Social Gatherings with Friends and Family: More than three times a week     Attends Religious Services: Never     Database administrator or Organizations: No     Attends Banker Meetings: Never     Marital Status: Never married  Intimate Partner Violence: Not At Risk (01/21/2023)    Humiliation, Afraid, Rape, and Kick questionnaire     Fear of Current or Ex-Partner: No     Emotionally Abused: No     Physically Abused: No     Sexually Abused: No       Current Medications    Current Outpatient  Medications:    ammonium lactate (LAC-HYDRIN) 12 % cream, Apply topically as needed for dry skin., Disp: 385 g, Rfl: 0   Cholecalciferol (VITAMIN D) 2000 UNITS CAPS, Take 1 capsule by mouth as needed., Disp: , Rfl:    desonide (DESOWEN) 0.05 % cream, Apply topically 2 (two) times daily., Disp: 30 g, Rfl:  0   EPINEPHrine (EPIPEN 2-PAK) 0.3 mg/0.3 mL IJ SOAJ injection, Inject 0.3 mg into the muscle as needed for anaphylaxis., Disp: 2 each, Rfl: 1   hydrOXYzine (VISTARIL) 25 MG capsule, Take 1 capsule (25 mg total) by mouth every 8 (eight) hours as needed., Disp: 30 capsule, Rfl: 0   loratadine (CLARITIN) 10 MG tablet, TAKE 1 TABLET BY MOUTH TWICE A DAY, Disp: 60 tablet, Rfl: 0   loratadine (CLARITIN) 10 MG tablet, Take 1 tablet (10 mg total) by mouth daily., Disp: 30 tablet, Rfl: 11   losartan (COZAAR) 25 MG tablet, Take 0.5 tablets (12.5 mg total) by mouth daily., Disp: 45 tablet, Rfl: 1   triamcinolone cream (KENALOG) 0.1 %, Apply topically 2 (two) times daily., Disp: 80 g, Rfl: 1     Allergies       Allergies  Allergen Reactions   Bee Venom     Mixed Vespid Venom        Other reaction(s): Unknown          ROS   Constitutional: Negative for fever or weight change.  Respiratory: Negative for cough and shortness of breath.   Cardiovascular: Negative for chest pain or palpitations.  Gastrointestinal: Negative for abdominal pain, she states developed constipation in July but is now taking probiotics and has a bowel movements daily now, no blood in stools , discussed evaluation by GI but she would like to hold off since feeling well now  Musculoskeletal: Negative for gait problem or joint swelling.  Skin: Negative for rash.  Neurological: Negative for dizziness or headache.  No other specific complaints in a complete review of systems (except as listed in HPI above).    Objective      Vitals:    01/21/23 1348  BP: 112/74  Pulse: 79  Resp: 16  Temp: 98.1 F (36.7 C)  TempSrc:  Oral  SpO2: 95%  Weight: 203 lb 14.4 oz (92.5 kg)  Height: 5\' 5"  (1.651 m)      Body mass index is 33.93 kg/m.   Physical Exam   Constitutional: Patient appears well-developed and well-nourished. No distress.  HENT: Head: Normocephalic and atraumatic. Ears: B TMs ok, no erythema or effusion; Nose: Nose normal. Mouth/Throat: Oropharynx is clear and moist. No oropharyngeal exudate.  Eyes: Conjunctivae and EOM are normal. Pupils are equal, round, and reactive to light. No scleral icterus.  Neck: Normal range of motion. Neck supple. No JVD present. No thyromegaly present.  Cardiovascular: Normal rate, regular rhythm and normal heart sounds.  No murmur heard. No BLE edema. Pulmonary/Chest: Effort normal and breath sounds normal. No respiratory distress. Abdominal: Soft. Bowel sounds are normal, no distension. There is no tenderness. no masses Breast: no lumps or masses, no nipple discharge or rashes FEMALE GENITALIA:  External genitalia normal External urethra normal Vaginal vault normal without discharge or lesions Cervix normal without discharge or lesions Bimanual exam normal without masses RECTAL: not done  Musculoskeletal: Normal range of motion, no joint effusions. No gross deformities Neurological: he is alert and oriented to person, place, and time. No cranial nerve deficit. Coordination, balance, strength, speech and gait are normal.  Skin: Skin is warm and dry. No rash noted. No erythema.  Psychiatric: Patient has a normal mood and affect. behavior is normal. Judgment and thought content normal.    Fall Risk:     01/21/2023    1:44 PM 12/09/2022    8:17 AM 12/01/2022    3:23 PM 08/14/2022    2:47 PM  03/16/2022    2:30 PM  Fall Risk   Falls in the past year? 0 0 0 0 0  Number falls in past yr: 0 0 0   0  Injury with Fall? 0 0 0   0  Risk for fall due to : No Fall Risks No Fall Risks No Fall Risks No Fall Risks No Fall Risks  Follow up Falls prevention discussed;Education  provided;Falls evaluation completed Falls prevention discussed;Education provided;Falls evaluation completed Falls prevention discussed Falls prevention discussed Falls prevention discussed        Functional Status Survey: Is the patient deaf or have difficulty hearing?: No Does the patient have difficulty seeing, even when wearing glasses/contacts?: No Does the patient have difficulty concentrating, remembering, or making decisions?: No Does the patient have difficulty walking or climbing stairs?: No Does the patient have difficulty dressing or bathing?: No Does the patient have difficulty doing errands alone such as visiting a doctor's office or shopping?: No     Assessment & Plan   1. Well adult exam   - Cytology - PAP - Lipid panel - CBC with Differential/Platelet - COMPLETE METABOLIC PANEL WITH GFR - Hemoglobin A1c - VITAMIN D 25 Hydroxy (Vit-D Deficiency, Fractures) - B12 and Folate Panel   2. Cervical cancer screening   - Cytology - PAP   3. Need for immunization against influenza   - Flu vaccine trivalent PF, 6mos and older(Flulaval,Afluria,Fluarix,Fluzone)   4. Dyslipidemia   - Lipid panel   5. Vitamin D deficiency   - VITAMIN D 25 Hydroxy (Vit-D Deficiency, Fractures)   6. B12 deficiency   - CBC with Differential/Platelet - B12 and Folate Panel   7. Dysmetabolic syndrome   - Hemoglobin A1c   8. Hypertension, benign   - COMPLETE METABOLIC PANEL WITH GFR      -USPSTF grade A and B recommendations reviewed with patient; age-appropriate recommendations, preventive care, screening tests, etc discussed and encouraged; healthy living encouraged; see AVS for patient education given to patient -Discussed importance of 150 minutes of physical activity weekly, eat two servings of fish weekly, eat one serving of tree nuts ( cashews, pistachios, pecans, almonds.Marland Kitchen) every other day, eat 6 servings of fruit/vegetables daily and drink plenty of water and avoid sweet  beverages.    -Reviewed Health Maintenance: Yes.

## 2023-01-26 LAB — CYTOLOGY - PAP
Comment: NEGATIVE
Diagnosis: NEGATIVE
High risk HPV: NEGATIVE

## 2023-03-04 NOTE — Progress Notes (Signed)
Name: Tamara Nelson   MRN: [id number]    DOB: July 23, 1965   Date:03/05/2023       Progress Note  Subjective  Chief Complaint  Follow Up  HPI  HTN: she has been taking taking half pill of losartan 25 mg daily, BP is towards low end of normal so we will switch to lisinopril 5 mg and return for bp check. Discussed risk of angioedema and or dry cough.   No chest pain, palpitation or SOB    B12 and Vitamin D deficiency: she takes supplements occasionally. Advised to try taking it daily. Purchase and pill box   Bee sting allergy: she needs refill of Epipen  Dyslipidemia: we will recheck labs.  The 10-year ASCVD risk score (Arnett DK, et al., 2019) is: 2.5%   Values used to calculate the score:     Age: 57 years     Sex: Female     Is Non-Hispanic African American: No     Diabetic: No     Tobacco smoker: No     Systolic Blood Pressure: 114 mmHg     Is BP treated: Yes     HDL Cholesterol: 44 mg/dL     Total Cholesterol: 176 mg/dL   Pre-diabetes: G6Y is now 6.1 %, she still drinks sweet tea - once a day - advised to try sugar substitute   Perennial AR with seasonal variation: worse in the Fall. Eyes gets itchy, no rhinorrhea or nasal congestion at this time . She takes otc loratadine and prn fluticasone, she also has hydroxyzine that she takes prn hives      Patient Active Problem List   Diagnosis Date Noted   Perennial allergic rhinitis with seasonal variation 02/06/2022   Hypertension, benign 02/06/2022   Diverticulosis 03/17/2018   Nephrolithiasis 03/17/2018   B12 deficiency 10/23/2015   Dyslipidemia 10/21/2015   Bee allergy status 10/17/2014   Abnormal serum level of alkaline phosphatase 10/17/2014   Menopause 10/17/2014   Dysmetabolic syndrome 10/17/2014   Obesity (BMI 30-39.9) 10/17/2014   Vitamin D deficiency 10/17/2014    Past Surgical History:  Procedure Laterality Date   BREAST BIOPSY Right 04/21/2022   [place] RT BREAST BX W LOC DEV 1ST LESION IMG BX SPEC US  GUIDE 04/21/2022 [facility]   COLONOSCOPY WITH PROPOFOL N/A 11/08/2020   Procedure: COLONOSCOPY WITH PROPOFOL;  Surgeon: Midge Minium, MD;  Location: [facility];  Service: Endoscopy;  Laterality: N/A;    Family History  Problem Relation Age of Onset   Cancer Mother        colon   Cancer Father        Liver and Lung   Stroke Father    Osteoarthritis Sister    Hypertension Brother    Diabetes Brother    Kidney cancer Brother    Diabetes Brother    Vascular Disease Brother    Breast cancer Neg Hx     Social History   Tobacco Use   Smoking status: Never   Smokeless tobacco: Never  Substance Use Topics   Alcohol use: No    Alcohol/week: 0.0 standard drinks of alcohol     Current Outpatient Medications:    ammonium lactate (LAC-HYDRIN) 12 % cream, Apply topically as needed for dry skin., Disp: 385 g, Rfl: 0   Cholecalciferol (VITAMIN D) 2000 UNITS CAPS, Take 1 capsule by mouth as needed., Disp: , Rfl:    desonide (DESOWEN) 0.05 % cream, Apply topically 2 (two) times daily., Disp: 30 g, Rfl:  0   EPINEPHrine (EPIPEN 2-PAK) 0.3 mg/0.3 mL IJ SOAJ injection, Inject 0.3 mg into the muscle as needed for anaphylaxis., Disp: 2 each, Rfl: 1   hydrOXYzine (VISTARIL) 25 MG capsule, Take 1 capsule (25 mg total) by mouth every 8 (eight) hours as needed., Disp: 30 capsule, Rfl: 0   loratadine (CLARITIN) 10 MG tablet, TAKE 1 TABLET BY MOUTH TWICE A DAY, Disp: 60 tablet, Rfl: 0   losartan (COZAAR) 25 MG tablet, Take 0.5 tablets (12.5 mg total) by mouth daily., Disp: 45 tablet, Rfl: 1   triamcinolone cream (KENALOG) 0.1 %, Apply topically 2 (two) times daily., Disp: 80 g, Rfl: 1  Allergies  Allergen Reactions   Bee Venom    Mixed Vespid Venom     Other reaction(s): Unknown    I personally reviewed active problem list, medication list, allergies, family history, social history, health maintenance with the patient/caregiver today.   ROS  Ten systems reviewed and is negative  except as mentioned in HPI    Objective  Vitals:   03/05/23 1425  BP: 114/72  Pulse: 80  Resp: 16  Temp: 97.7 F (36.5 C)  TempSrc: Oral  SpO2: 97%  Weight: 202 lb 4.8 oz (91.8 kg)  Height: 5\' 5"  (1.651 m)    Body mass index is 33.66 kg/m.  Physical Exam  Constitutional: Patient appears well-developed and well-nourished. Obese  No distress.  HEENT: head atraumatic, normocephalic, pupils equal and reactive to light, neck supple Cardiovascular: Normal rate, regular rhythm and normal heart sounds.  No murmur heard. No BLE edema. Pulmonary/Chest: Effort normal and breath sounds normal. No respiratory distress. Abdominal: Soft.  There is no tenderness. Psychiatric: Patient has a normal mood and affect. behavior is normal. Judgment and thought content normal.    PHQ2/9:    03/05/2023    2:27 PM 01/21/2023    1:44 PM 12/09/2022    8:17 AM 12/01/2022    3:24 PM 08/14/2022    2:47 PM  Depression screen PHQ 2/9  Decreased Interest 0 0 0 0 0  Down, Depressed, Hopeless 0 0 0 0 0  PHQ - 2 Score 0 0 0 0 0  Altered sleeping 0 0 0  0  Tired, decreased energy 0 0 0  0  Change in appetite 0 0 0  0  Feeling bad or failure about yourself  0 0 0  0  Trouble concentrating 0 0 0  0  Moving slowly or fidgety/restless 0 0 0  0  Suicidal thoughts 0 0 0  0  PHQ-9 Score 0 0 0  0  Difficult doing work/chores  Not difficult at all Not difficult at all      phq 9 is negative   Fall Risk:    03/05/2023    2:26 PM 01/21/2023    1:44 PM 12/09/2022    8:17 AM 12/01/2022    3:23 PM 08/14/2022    2:47 PM  Fall Risk   Falls in the past year? 0 0 0 0 0  Number falls in past yr:  0 0 0   Injury with Fall?  0 0 0   Risk for fall due to : No Fall Risks No Fall Risks No Fall Risks No Fall Risks No Fall Risks  Follow up Falls prevention discussed Falls prevention discussed;Education provided;Falls evaluation completed Falls prevention discussed;Education provided;Falls evaluation completed Falls  prevention discussed Falls prevention discussed      Functional Status Survey: Is the patient deaf or have difficulty  hearing?: No Does the patient have difficulty seeing, even when wearing glasses/contacts?: No Does the patient have difficulty concentrating, remembering, or making decisions?: No Does the patient have difficulty walking or climbing stairs?: No Does the patient have difficulty dressing or bathing?: No Does the patient have difficulty doing errands alone such as visiting a doctor's office or shopping?: No    Assessment & Plan  1. Hypertension, benign  - lisinopril (ZESTRIL) 5 MG tablet; Take 1 tablet (5 mg total) by mouth daily.  Dispense: 90 tablet; Refill: 0  2. Vitamin D deficiency  Last level at goal   3. Dyslipidemia  On life style modification   4. B12 deficiency  Continue supplementation   5. Dysmetabolic syndrome  A1C went up, must change her diet

## 2023-03-05 ENCOUNTER — Encounter: Payer: Self-pay | Admitting: Family Medicine

## 2023-03-05 ENCOUNTER — Ambulatory Visit (INDEPENDENT_AMBULATORY_CARE_PROVIDER_SITE_OTHER): Payer: PRIVATE HEALTH INSURANCE | Admitting: Family Medicine

## 2023-03-05 VITALS — BP 114/72 | HR 80 | Temp 97.7°F | Resp 16 | Ht 65.0 in | Wt 202.3 lb

## 2023-03-05 DIAGNOSIS — E538 Deficiency of other specified B group vitamins: Secondary | ICD-10-CM

## 2023-03-05 DIAGNOSIS — I10 Essential (primary) hypertension: Secondary | ICD-10-CM | POA: Diagnosis not present

## 2023-03-05 DIAGNOSIS — E785 Hyperlipidemia, unspecified: Secondary | ICD-10-CM

## 2023-03-05 DIAGNOSIS — E559 Vitamin D deficiency, unspecified: Secondary | ICD-10-CM

## 2023-03-05 DIAGNOSIS — E8881 Metabolic syndrome: Secondary | ICD-10-CM

## 2023-03-05 MED ORDER — LISINOPRIL 5 MG PO TABS
5.0000 mg | ORAL_TABLET | Freq: Every day | ORAL | 0 refills | Status: DC
Start: 2023-03-05 — End: 2023-05-31

## 2023-03-19 ENCOUNTER — Encounter: Payer: BC Managed Care – PPO | Admitting: Family Medicine

## 2023-03-22 ENCOUNTER — Encounter: Payer: Self-pay | Admitting: Physician Assistant

## 2023-03-22 ENCOUNTER — Ambulatory Visit (INDEPENDENT_AMBULATORY_CARE_PROVIDER_SITE_OTHER): Payer: PRIVATE HEALTH INSURANCE | Admitting: Physician Assistant

## 2023-03-22 ENCOUNTER — Ambulatory Visit: Payer: Self-pay | Admitting: *Deleted

## 2023-03-22 VITALS — BP 138/74 | HR 72 | Temp 97.1°F | Resp 16 | Ht 65.0 in | Wt 202.5 lb

## 2023-03-22 DIAGNOSIS — W57XXXA Bitten or stung by nonvenomous insect and other nonvenomous arthropods, initial encounter: Secondary | ICD-10-CM | POA: Diagnosis not present

## 2023-03-22 DIAGNOSIS — M7989 Other specified soft tissue disorders: Secondary | ICD-10-CM | POA: Diagnosis not present

## 2023-03-22 DIAGNOSIS — S50862A Insect bite (nonvenomous) of left forearm, initial encounter: Secondary | ICD-10-CM | POA: Diagnosis not present

## 2023-03-22 MED ORDER — PREDNISONE 20 MG PO TABS: ORAL_TABLET | ORAL | 0 refills | Status: DC

## 2023-03-22 MED ORDER — SULFAMETHOXAZOLE-TRIMETHOPRIM 800-160 MG PO TABS
1.0000 | ORAL_TABLET | Freq: Two times a day (BID) | ORAL | 0 refills | Status: DC
Start: 2023-03-22 — End: 2023-06-08

## 2023-03-22 NOTE — Progress Notes (Signed)
Acute Office Visit   Patient: Tamara Nelson   DOB: 1966/03/22   57 y.o. Female  MRN: [id number] Visit Date: 03/22/2023  Today's healthcare provider: Oswaldo Conroy Ondra Deboard, PA-C  Introduced myself to the patient as a Secondary school teacher and provided education on APPs in clinical practice.    Chief Complaint  Patient presents with   Edema    Left arm/hand happened Saturday night, red/itchy/swollen   Subjective    HPI HPI     Edema    Additional comments: Left arm/hand happened Saturday night, red/itchy/swollen      Last edited by Forde Radon, CMA on 03/22/2023 10:13 AM.      She reports left hand and forearm swelling since Sat  She reports mild tightness due to swelling, denies pain Reports mild itching and increased warmth to the area She thinks she may have been bitten by ants while doing yardwork    Medications: Outpatient Medications Prior to Visit  Medication Sig   ammonium lactate (LAC-HYDRIN) 12 % cream Apply topically as needed for dry skin.   Cholecalciferol (VITAMIN D) 2000 UNITS CAPS Take 1 capsule by mouth as needed.   desonide (DESOWEN) 0.05 % cream Apply topically 2 (two) times daily.   EPINEPHrine (EPIPEN 2-PAK) 0.3 mg/0.3 mL IJ SOAJ injection Inject 0.3 mg into the muscle as needed for anaphylaxis.   hydrOXYzine (VISTARIL) 25 MG capsule Take 1 capsule (25 mg total) by mouth every 8 (eight) hours as needed.   lisinopril (ZESTRIL) 5 MG tablet Take 1 tablet (5 mg total) by mouth daily.   loratadine (CLARITIN) 10 MG tablet TAKE 1 TABLET BY MOUTH TWICE A DAY   triamcinolone cream (KENALOG) 0.1 % Apply topically 2 (two) times daily.   No facility-administered medications prior to visit.    Review of Systems  Constitutional:  Negative for chills, fatigue and fever.  Respiratory:  Negative for cough, shortness of breath and wheezing.   Gastrointestinal:  Negative for diarrhea, nausea and vomiting.  Skin:  Positive for color change.       Mild redness  and swelling along left forearm    Neurological:  Negative for dizziness, light-headedness, numbness and headaches.        Objective    BP 138/74   Pulse 72   Temp (!) 97.1 F (36.2 C) (Temporal)   Resp 16   Ht 5\' 5"  (1.651 m)   Wt 202 lb 8 oz (91.9 kg)   SpO2 97%   BMI 33.70 kg/m     Physical Exam Vitals reviewed.  Constitutional:      General: She is awake.     Appearance: Normal appearance. She is well-developed and well-groomed.  HENT:     Head: Normocephalic and atraumatic.  Pulmonary:     Effort: Pulmonary effort is normal.  Skin:    General: Skin is warm and dry.     Findings: Erythema present.       Neurological:     Mental Status: She is alert.  Psychiatric:        Behavior: Behavior is cooperative.       No results found for any visits on 03/22/23.  Assessment & Plan      No follow-ups on file.      Problem List Items Addressed This Visit   None Visit Diagnoses     Left upper extremity swelling    -  Primary   Relevant Medications   sulfamethoxazole-trimethoprim (BACTRIM DS)  800-160 MG tablet   predniSONE (DELTASONE) 20 MG tablet   Insect bite of left forearm, initial encounter       Relevant Medications   sulfamethoxazole-trimethoprim (BACTRIM DS) 800-160 MG tablet   predniSONE (DELTASONE) 20 MG tablet      Acute, new concern Swelling and redness present along left hand, wrist and forearm  Appears to be neurovascularly intact based on exam today Will send in script for Prednisone taper and bactrim given erythema,itching and warmth She is UTD on Tdap booster  Recommend starting oral OTC antihistamine such as Claritin for further itching relief Reviewed ED and return precautions Follow up as needed for persistent or progressing symptoms     No follow-ups on file.   I, Ashlee Bewley E Jr Milliron, PA-C, have reviewed all documentation for this visit. The documentation on 03/22/23 for the exam, diagnosis, procedures, and orders are all accurate  and complete.   Jacquelin Hawking, MHS, PA-C [facility]

## 2023-03-22 NOTE — Telephone Encounter (Signed)
  Chief Complaint: Swelling Symptoms: Possible unknown insect bite left hand. Swelling past wrist, itchy. Left hand mostly swollen. Mild redness, warm to touch. Frequency: SAturday Pertinent Negatives: Patient denies pain, fever Disposition: [] ED /[] Urgent Care (no appt availability in office) / [x] Appointment(In office/virtual)/ []  [facility]/ [] Home Care/ [] Refused Recommended Disposition /[] [facility]/ []  Follow-up with PCP Additional Notes: Appt secured for today. Care advise provided, pt verbalizes understanding. Reason for Disposition  [1] Red or very tender (to touch) area AND [2] getting larger over 48 hours after the bite  Answer Assessment - Initial Assessment Questions 1. TYPE of INSECT: "What type of insect was it?"      Unsure 2. ONSET: "When did you get bitten?"      Unsure 3. LOCATION: "Where is the insect bite located?"      Side of wrist 4. REDNESS: "Is the area red or pink?" If Yes, ask: "What size is area of redness?" (inches or cm). "When did the redness start?"     Redness and warmth, slight 5. PAIN: "Is there any pain?" If Yes, ask: "How bad is it?"  (Scale 1-10; or mild, moderate, severe)     No 6. ITCHING: "Does it itch?" If Yes, ask: "How bad is the itch?"    - MILD: doesn't interfere with normal activities   - MODERATE-SEVERE: interferes with work, school, sleep, or other activities      itchy 7. SWELLING: "How big is the swelling?" (inches, cm, or compare to coins)     Moderate 8. OTHER SYMPTOMS: "Do you have any other symptoms?"  (e.g., difficulty breathing, hives)     No  Protocols used: Insect Bite-A-AH

## 2023-03-22 NOTE — Patient Instructions (Addendum)
I also recommend adding an antihistamine to your daily regimen This includes medications like Claritin, Allegra, Zyrtec- the generics of these work very well and are usually less expensive This can help with the itching and allergic reaction from insect bites without causing much drowsiness like benadryl    I have sent in a script for Prednisone taper to be taken in the morning with breakfast per the instructions on the container Remember that steroids can cause sleeplessness, irritability, increased hunger and elevated glucose levels so be mindful of these side effects. They should lessen as you progress to the lower doses of the taper.  I have sent in a script for an antibiotic called Bactrim to help make sure there isn't a skin infection developing. Please finish the entire course unless you start to develop an allergic reaction. Please call us if this occurs or for any questions you may have  If the area becomes more swollen, tender, becomes more hard or tight, please let us know or go to urgent care/ emergency room

## 2023-03-31 ENCOUNTER — Encounter: Payer: Self-pay | Admitting: Physician Assistant

## 2023-03-31 ENCOUNTER — Ambulatory Visit (INDEPENDENT_AMBULATORY_CARE_PROVIDER_SITE_OTHER): Payer: PRIVATE HEALTH INSURANCE | Admitting: Physician Assistant

## 2023-03-31 VITALS — BP 102/68 | HR 75 | Temp 98.2°F | Resp 16 | Ht 65.0 in | Wt 201.4 lb

## 2023-03-31 DIAGNOSIS — J069 Acute upper respiratory infection, unspecified: Secondary | ICD-10-CM | POA: Diagnosis not present

## 2023-03-31 NOTE — Progress Notes (Signed)
Acute Office Visit   Patient: Tamara Nelson   DOB: [birth year]   57 y.o. Female  MRN: [id number] Visit Date: 03/31/2023  Today's healthcare provider: Oswaldo Conroy Kortni Hasten, PA-C  Introduced myself to the patient as a Secondary school teacher and provided education on APPs in clinical practice.    Chief Complaint  Patient presents with   Cough    Sx onset since Saturday   Nasal Congestion   Headache   Subjective    HPI HPI     Cough    Additional comments: Sx onset since Saturday      Last edited by Forde Radon, CMA on 03/31/2023  8:22 AM.      URI -type symptoms  Onset: sudden Duration:ongoing since Saturday 03/27/23 Associated symptoms: coughing-mildly productive, sneezing,rhinorrhea, eye itching, ear itching, scratchy throat,    Intervention: cough drops, started taking Benadryl   Recent sick contacts: none to her knowledge  Recent travel: none  COVID testing at home: has not tested   Result:NA    Medications: Outpatient Medications Prior to Visit  Medication Sig   ammonium lactate (LAC-HYDRIN) 12 % cream Apply topically as needed for dry skin.   Cholecalciferol (VITAMIN D) 2000 UNITS CAPS Take 1 capsule by mouth as needed.   desonide (DESOWEN) 0.05 % cream Apply topically 2 (two) times daily.   EPINEPHrine (EPIPEN 2-PAK) 0.3 mg/0.3 mL IJ SOAJ injection Inject 0.3 mg into the muscle as needed for anaphylaxis.   hydrOXYzine (VISTARIL) 25 MG capsule Take 1 capsule (25 mg total) by mouth every 8 (eight) hours as needed.   lisinopril (ZESTRIL) 5 MG tablet Take 1 tablet (5 mg total) by mouth daily.   loratadine (CLARITIN) 10 MG tablet TAKE 1 TABLET BY MOUTH TWICE A DAY   predniSONE (DELTASONE) 20 MG tablet Take 60mg  PO daily x 2 days, then40mg  PO daily x 2 days, then 20mg  PO daily x 3 days   sulfamethoxazole-trimethoprim (BACTRIM DS) 800-160 MG tablet Take 1 tablet by mouth 2 (two) times daily.   triamcinolone cream (KENALOG) 0.1 % Apply topically 2 (two) times  daily.   No facility-administered medications prior to visit.    Review of Systems  Constitutional:  Positive for fatigue. Negative for chills and fever.  HENT:  Positive for congestion, postnasal drip, rhinorrhea and sneezing.   Respiratory:  Positive for cough. Negative for shortness of breath and wheezing.   Gastrointestinal:  Negative for diarrhea, nausea and vomiting.  Neurological:  Positive for headaches.        Objective    BP 102/68   Pulse 75   Temp 98.2 F (36.8 C) (Oral)   Resp 16   Ht 5\' 5"  (1.651 m)   Wt 201 lb 6.4 oz (91.4 kg)   SpO2 97%   BMI 33.51 kg/m     Physical Exam Vitals reviewed.  Constitutional:      General: She is awake.     Appearance: Normal appearance. She is well-developed and well-groomed.  HENT:     Head: Normocephalic and atraumatic.     Right Ear: Hearing and ear canal normal. A middle ear effusion is present.     Left Ear: Hearing and ear canal normal. A middle ear effusion is present.     Mouth/Throat:     Lips: Pink.     Pharynx: Oropharynx is clear. Uvula midline. No pharyngeal swelling, oropharyngeal exudate or posterior oropharyngeal erythema.  Eyes:     General: Lids are  normal. Gaze aligned appropriately.     Extraocular Movements: Extraocular movements intact.     Conjunctiva/sclera: Conjunctivae normal.  Cardiovascular:     Rate and Rhythm: Normal rate and regular rhythm.     Pulses: Normal pulses.     Heart sounds: Normal heart sounds. No murmur heard.    No friction rub. No gallop.  Pulmonary:     Effort: Pulmonary effort is normal.     Breath sounds: Normal breath sounds. No decreased air movement. No decreased breath sounds, wheezing, rhonchi or rales.  Musculoskeletal:     Cervical back: Normal range of motion and neck supple.     Right lower leg: No edema.     Left lower leg: No edema.  Lymphadenopathy:     Head:     Right side of head: Submandibular adenopathy present. No submental or preauricular  adenopathy.     Left side of head: No submental, submandibular or preauricular adenopathy.     Cervical:     Right cervical: No superficial cervical adenopathy.    Left cervical: No superficial cervical adenopathy.     Upper Body:     Right upper body: No supraclavicular adenopathy.     Left upper body: No supraclavicular adenopathy.  Neurological:     Mental Status: She is alert.  Psychiatric:        Behavior: Behavior is cooperative.       No results found for any visits on 03/31/23.  Assessment & Plan      No follow-ups on file.      Problem List Items Addressed This Visit   None Visit Diagnoses     Viral upper respiratory tract infection    -  Primary     Visit with patient indicates symptoms comprised of rhinorrhea, coughing, sneezing, and headaches  since 03/27/23 congruent with acute URI that is likely viral in nature  She is on day 5 of symptoms- discussed antiviral window and efficacy as well as COVID quarantine and masking recommendations, no testing today due to antiviral window  Due to nature and duration of symptoms recommended treatment regimen is symptomatic relief and follow up if needed Discussed with patient the various viral and bacterial etiologies of current illness and appropriate course of treatment Discussed OTC medication options for multisymptom relief such as Dayquil/Nyquil, Theraflu, AlkaSeltzer, etc. Discussed return precautions if symptoms are not improving or worsen over next 5-7 days.     No follow-ups on file.   I, Almer Bushey E Phylisha Dix, PA-C, have reviewed all documentation for this visit. The documentation on 03/31/23 for the exam, diagnosis, procedures, and orders are all accurate and complete.   Jacquelin Hawking, MHS, PA-C [facility]

## 2023-03-31 NOTE — Patient Instructions (Signed)

## 2023-04-05 ENCOUNTER — Ambulatory Visit
Admission: RE | Admit: 2023-04-05 | Discharge: 2023-04-05 | Disposition: A | Discharge status: Home / Self Care | Payer: PRIVATE HEALTH INSURANCE | Source: Ambulatory Visit | Attending: Family Medicine | Admitting: Family Medicine

## 2023-04-05 DIAGNOSIS — Z1231 Encounter for screening mammogram for malignant neoplasm of breast: Secondary | ICD-10-CM | POA: Insufficient documentation

## 2023-05-31 ENCOUNTER — Other Ambulatory Visit: Payer: Self-pay | Admitting: Family Medicine

## 2023-05-31 DIAGNOSIS — I10 Essential (primary) hypertension: Secondary | ICD-10-CM

## 2023-06-08 ENCOUNTER — Encounter: Payer: Self-pay | Admitting: Family Medicine

## 2023-06-08 ENCOUNTER — Ambulatory Visit (INDEPENDENT_AMBULATORY_CARE_PROVIDER_SITE_OTHER): Payer: PRIVATE HEALTH INSURANCE | Admitting: Family Medicine

## 2023-06-08 VITALS — BP 124/80 | HR 87 | Resp 16 | Ht 65.0 in | Wt 205.7 lb

## 2023-06-08 DIAGNOSIS — E8881 Metabolic syndrome: Secondary | ICD-10-CM

## 2023-06-08 DIAGNOSIS — E538 Deficiency of other specified B group vitamins: Secondary | ICD-10-CM

## 2023-06-08 DIAGNOSIS — E785 Hyperlipidemia, unspecified: Secondary | ICD-10-CM | POA: Diagnosis not present

## 2023-06-08 DIAGNOSIS — E559 Vitamin D deficiency, unspecified: Secondary | ICD-10-CM

## 2023-06-08 DIAGNOSIS — L853 Xerosis cutis: Secondary | ICD-10-CM | POA: Diagnosis not present

## 2023-06-08 DIAGNOSIS — Z8679 Personal history of other diseases of the circulatory system: Secondary | ICD-10-CM

## 2023-06-08 MED ORDER — AMMONIUM LACTATE 12 % EX CREA
TOPICAL_CREAM | CUTANEOUS | 0 refills | Status: DC | PRN
Start: 2023-06-08 — End: 2023-11-30

## 2023-06-08 NOTE — Progress Notes (Signed)
 Name: Tamara Nelson   MRN: [id number]    DOB: [birth year]   Date:06/08/2023       Progress Note  Subjective  Chief Complaint  Chief Complaint  Patient presents with   Medical Management of Chronic Issues   HPI   HTN: she was taking taking half pill of losartan  25 mg daily, BP was still  towards low end of normal so we switched to lisinopril  5 mg however she never filled medication. She has been monitoring bp without taking any pills over the past month, and usually within normal limits, occasionally above normal when she first checks but improves with rest. No chest pain, palpitation or headaches. She brought her monitor and is matches our reading in the office We will continue to monitor, continue life style modification and monitor bp , resume medication if bp goes above 140/90 on a regular basis    B12 and Vitamin D  deficiency: she takes supplements occasionally. Advised to try taking it daily. 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   The 10-year ASCVD risk score (Arnett DK, et al., 2019) is: 2.2%   Values used to calculate the score:     Age: 58 years     Sex: Female     Is Non-Hispanic African American: No     Diabetic: No     Tobacco smoker: No     Systolic Blood Pressure: 124 mmHg     Is BP treated: No     HDL Cholesterol: 44 mg/dL     Total Cholesterol: 176 mg/dL    Pre-diabetes: J8R is now 6.1 %, she still drinks sweet tea - once a day 16 oz max- advised to try sugar substitute    Perennial AR with seasonal variation: worse in the Fall. Eyes gets itchy, no rhinorrhea or nasal congestion at this time . She takes otc loratadine  and prn fluticasone , she also has hydroxyzine  that she takes prn hives  Stable    Dry skin: her skin cracks, worse on hands, needs refill of lotion  Patient Active Problem List   Diagnosis Date Noted   Perennial allergic rhinitis with seasonal variation 02/06/2022   Hypertension, benign  02/06/2022   Diverticulosis 03/17/2018   Nephrolithiasis 03/17/2018   B12 deficiency 10/23/2015   Dyslipidemia 10/21/2015   Bee allergy status 10/17/2014   Abnormal serum level of alkaline phosphatase 10/17/2014   Menopause 10/17/2014   Dysmetabolic syndrome 10/17/2014   Obesity (BMI 30-39.9) 10/17/2014   Vitamin D  deficiency 10/17/2014    Past Surgical History:  Procedure Laterality Date   BREAST BIOPSY Right 04/21/2022   US  RT BREAST BX W LOC DEV 1ST LESION IMG BX SPEC US  GUIDE 04/21/2022 [facility]   COLONOSCOPY WITH PROPOFOL  N/A 11/08/2020   Procedure: COLONOSCOPY WITH PROPOFOL ;  Surgeon: Jinny Carmine, MD;  Location: [facility];  Service: Endoscopy;  Laterality: N/A;    Family History  Problem Relation Age of Onset   Cancer Mother        colon   Cancer Father        Liver and Lung   Stroke Father    Osteoarthritis Sister    Hypertension Brother    Diabetes Brother    Kidney cancer Brother    Diabetes Brother    Vascular Disease Brother    Breast cancer Neg Hx     Social History   Tobacco Use   Smoking status: Never   Smokeless  tobacco: Never  Substance Use Topics   Alcohol use: No    Alcohol/week: 0.0 standard drinks of alcohol     Current Outpatient Medications:    ammonium lactate  (LAC-HYDRIN ) 12 % cream, Apply topically as needed for dry skin., Disp: 385 g, Rfl: 0   Cholecalciferol (VITAMIN D ) 2000 UNITS CAPS, Take 1 capsule by mouth as needed., Disp: , Rfl:    desonide  (DESOWEN ) 0.05 % cream, Apply topically 2 (two) times daily., Disp: 30 g, Rfl: 0   EPINEPHrine  (EPIPEN  2-PAK) 0.3 mg/0.3 mL IJ SOAJ injection, Inject 0.3 mg into the muscle as needed for anaphylaxis., Disp: 2 each, Rfl: 1   hydrOXYzine  (VISTARIL ) 25 MG capsule, Take 1 capsule (25 mg total) by mouth every 8 (eight) hours as needed., Disp: 30 capsule, Rfl: 0   lisinopril  (ZESTRIL ) 5 MG tablet, TAKE 1 TABLET (5 MG TOTAL) BY MOUTH DAILY., Disp: 90 tablet, Rfl: 0   loratadine   (CLARITIN ) 10 MG tablet, TAKE 1 TABLET BY MOUTH TWICE A DAY, Disp: 60 tablet, Rfl: 0   predniSONE  (DELTASONE ) 20 MG tablet, Take 60mg  PO daily x 2 days, then40mg  PO daily x 2 days, then 20mg  PO daily x 3 days, Disp: 13 tablet, Rfl: 0   sulfamethoxazole -trimethoprim  (BACTRIM  DS) 800-160 MG tablet, Take 1 tablet by mouth 2 (two) times daily., Disp: 14 tablet, Rfl: 0   triamcinolone  cream (KENALOG ) 0.1 %, Apply topically 2 (two) times daily., Disp: 80 g, Rfl: 1  Allergies  Allergen Reactions   Bee Venom    Mixed Vespid Venom     Other reaction(s): Unknown    I personally reviewed active problem list, medication list, allergies with the patient/caregiver today.   ROS  Ten systems reviewed and is negative except as mentioned in HPI    Objective  Vitals:   06/08/23 1522  BP: 124/80  Pulse: 87  Resp: 16  SpO2: 97%  Weight: 205 lb 11.2 oz (93.3 kg)  Height: 5' 5 (1.651 m)    Body mass index is 34.23 kg/m.  Physical Exam  Constitutional: Patient appears well-developed and well-nourished. Obese  No distress.  HEENT: head atraumatic, normocephalic, pupils equal and reactive to light, , neck supple Cardiovascular: Normal rate, regular rhythm and normal heart sounds.  No murmur heard. No BLE edema. Pulmonary/Chest: Effort normal and breath sounds normal. No respiratory distress. Abdominal: Soft.  There is no tenderness. Psychiatric: Patient has a normal mood and affect. behavior is normal. Judgment and thought content normal.   Diabetic Foot Exam:     PHQ2/9:    06/08/2023    3:17 PM 03/31/2023    8:17 AM 03/22/2023    9:59 AM 03/05/2023    2:27 PM 01/21/2023    1:44 PM  Depression screen PHQ 2/9  Decreased Interest 0 0 0 0 0  Down, Depressed, Hopeless 0 0 0 0 0  PHQ - 2 Score 0 0 0 0 0  Altered sleeping 0  0 0 0  Tired, decreased energy 0  0 0 0  Change in appetite 0  0 0 0  Feeling bad or failure about yourself  0  0 0 0  Trouble concentrating 0  0 0 0  Moving slowly  or fidgety/restless 0  0 0 0  Suicidal thoughts 0  0 0 0  PHQ-9 Score 0  0 0 0  Difficult doing work/chores Not difficult at all  Not difficult at all  Not difficult at all    phq 9 is negative  Fall Risk:    06/08/2023    3:17 PM 03/31/2023    8:17 AM 03/22/2023    9:58 AM 03/05/2023    2:26 PM 01/21/2023    1:44 PM  Fall Risk   Falls in the past year? 0 0 0 0 0  Number falls in past yr: 0 0 0  0  Injury with Fall? 0 0 0  0  Risk for fall due to : No Fall Risks No Fall Risks No Fall Risks No Fall Risks No Fall Risks  Follow up Falls prevention discussed;Education provided;Falls evaluation completed Falls prevention discussed;Education provided;Falls evaluation completed Falls prevention discussed;Education provided;Falls evaluation completed Falls prevention discussed Falls prevention discussed;Education provided;Falls evaluation completed     Assessment & Plan  1. Dry skin  - ammonium lactate  (LAC-HYDRIN ) 12 % cream; Apply topically as needed for dry skin.  Dispense: 385 g; Refill: 0  2. History of hypertension (Primary)  Off bp medication, and seems to be under control    3. Dyslipidemia  On diet only  4. B12 deficiency  Continue supplementation  5. Vitamin D  deficiency  Continue supplementation   6. Dysmetabolic syndrome   On lifestyle modification

## 2023-07-12 ENCOUNTER — Ambulatory Visit (INDEPENDENT_AMBULATORY_CARE_PROVIDER_SITE_OTHER): Payer: PRIVATE HEALTH INSURANCE | Admitting: Nurse Practitioner

## 2023-07-12 ENCOUNTER — Encounter: Payer: Self-pay | Admitting: Nurse Practitioner

## 2023-07-12 ENCOUNTER — Telehealth: Payer: Self-pay

## 2023-07-12 VITALS — BP 126/76 | HR 79 | Resp 18 | Ht 66.0 in | Wt 205.2 lb

## 2023-07-12 DIAGNOSIS — M7989 Other specified soft tissue disorders: Secondary | ICD-10-CM

## 2023-07-12 DIAGNOSIS — Z91038 Other insect allergy status: Secondary | ICD-10-CM | POA: Diagnosis not present

## 2023-07-12 MED ORDER — PREDNISONE 10 MG (21) PO TBPK
ORAL_TABLET | ORAL | 0 refills | Status: DC
Start: 2023-07-12 — End: 2023-08-03

## 2023-07-12 NOTE — Patient Instructions (Signed)
 Pepcid, two times a day  Zyrtec daily

## 2023-07-12 NOTE — Progress Notes (Signed)
 BP 126/76   Pulse 79   Resp 18   Ht 5\' 6"  (1.676 m)   Wt 205 lb 3.2 oz (93.1 kg)   SpO2 98%   BMI 33.12 kg/m    Subjective:    Patient ID: Tamara Nelson, female    DOB: 1965-07-23, 58 y.o.   MRN: [id number]  HPI: Tamara Nelson is a 58 y.o. female  Chief Complaint  Patient presents with   Insect Bite    ants    Discussed the use of AI scribe software for clinical note transcription with the patient, who gave verbal consent to proceed.  History of Present Illness   The patient presents with an ant bites reaction on her left arm and hand.  She experienced ant bites on her arm and hand yesterday while wearing gloves and sleeves. The ants moved too quickly for her to remove them in time. Today, she has itching at the site of the bites.  Her neighbor's yard is infested with ants, which become more active after rain. She was outside picking up sticks when she was bitten.  She applied Benadryl cream and took one Benadryl pill to manage the symptoms. She forgot to bring her cream with her today. She hopes for a prescription similar to what she received in October or November of last year, but she does not recall the specific medication. She continues to use [facility] in [place] as her pharmacy.       06/08/2023    3:17 PM 03/31/2023    8:17 AM 03/22/2023    9:59 AM  Depression screen PHQ 2/9  Decreased Interest 0 0 0  Down, Depressed, Hopeless 0 0 0  PHQ - 2 Score 0 0 0  Altered sleeping 0  0  Tired, decreased energy 0  0  Change in appetite 0  0  Feeling bad or failure about yourself  0  0  Trouble concentrating 0  0  Moving slowly or fidgety/restless 0  0  Suicidal thoughts 0  0  PHQ-9 Score 0  0  Difficult doing work/chores Not difficult at all  Not difficult at all    Relevant past medical, surgical, family and social history reviewed and updated as indicated. Interim medical history since our last visit reviewed. Allergies and medications reviewed and  updated.  Review of Systems  Ten systems reviewed and is negative except as mentioned in HPI      Objective:    BP 126/76   Pulse 79   Resp 18   Ht 5\' 6"  (1.676 m)   Wt 205 lb 3.2 oz (93.1 kg)   SpO2 98%   BMI 33.12 kg/m    Wt Readings from Last 3 Encounters:  07/12/23 205 lb 3.2 oz (93.1 kg)  06/08/23 205 lb 11.2 oz (93.3 kg)  03/31/23 201 lb 6.4 oz (91.4 kg)    Physical Exam Vitals reviewed.  Constitutional:      Appearance: Normal appearance.  HENT:     Head: Normocephalic.  Cardiovascular:     Rate and Rhythm: Normal rate.  Pulmonary:     Effort: Pulmonary effort is normal.  Skin:    General: Skin is warm and dry.     Findings: Rash present. Rash is pustular.     Comments: Left hand and forearm, swelling, multiple ant bites  Neurological:     General: No focal deficit present.     Mental Status: She is alert and oriented to person, place, and  time. Mental status is at baseline.  Psychiatric:        Mood and Affect: Mood normal.        Behavior: Behavior normal.        Thought Content: Thought content normal.        Judgment: Judgment normal.     Results for orders placed or performed in visit on 01/21/23  Cytology - PAP   Collection Time: 01/21/23  2:33 PM  Result Value Ref Range   High risk HPV Negative    Adequacy      Satisfactory for evaluation; transformation zone component PRESENT.   Diagnosis      - Negative for intraepithelial lesion or malignancy (NILM)   Comment Normal Reference Range HPV - Negative   Lipid panel   Collection Time: 01/21/23  2:44 PM  Result Value Ref Range   Cholesterol 176 <200 mg/dL   HDL 44 (L) > OR = 50 mg/dL   Triglycerides 97 <295 mg/dL   LDL Cholesterol (Calc) 112 (H) mg/dL (calc)   Total CHOL/HDL Ratio 4.0 <5.0 (calc)   Non-HDL Cholesterol (Calc) 132 (H) <130 mg/dL (calc)  CBC with Differential/Platelet   Collection Time: 01/21/23  2:44 PM  Result Value Ref Range   WBC 8.2 3.8 - 10.8 Thousand/uL   RBC 4.69  3.80 - 5.10 Million/uL   Hemoglobin 13.6 11.7 - 15.5 g/dL   HCT 28.4 13.2 - 44.0 %   MCV 88.7 80.0 - 100.0 fL   MCH 29.0 27.0 - 33.0 pg   MCHC 32.7 32.0 - 36.0 g/dL   RDW 10.2 72.5 - 36.6 %   Platelets 278 140 - 400 Thousand/uL   MPV 10.5 7.5 - 12.5 fL   Neutro Abs 5,068 1,500 - 7,800 cells/uL   Lymphs Abs 2,567 850 - 3,900 cells/uL   Absolute Monocytes 467 200 - 950 cells/uL   Eosinophils Absolute 82 15 - 500 cells/uL   Basophils Absolute 16 0 - 200 cells/uL   Neutrophils Relative % 61.8 %   Total Lymphocyte 31.3 %   Monocytes Relative 5.7 %   Eosinophils Relative 1.0 %   Basophils Relative 0.2 %  COMPLETE METABOLIC PANEL WITH GFR   Collection Time: 01/21/23  2:44 PM  Result Value Ref Range   Glucose, Bld 96 65 - 99 mg/dL   BUN 14 7 - 25 mg/dL   Creat 4.40 3.47 - 4.25 mg/dL   eGFR 98 > OR = 60 ZD/GLO/7.56E3   BUN/Creatinine Ratio SEE NOTE: 6 - 22 (calc)   Sodium 139 135 - 146 mmol/L   Potassium 3.9 3.5 - 5.3 mmol/L   Chloride 104 98 - 110 mmol/L   CO2 23 20 - 32 mmol/L   Calcium 9.5 8.6 - 10.4 mg/dL   Total Protein 7.0 6.1 - 8.1 g/dL   Albumin 4.3 3.6 - 5.1 g/dL   Globulin 2.7 1.9 - 3.7 g/dL (calc)   AG Ratio 1.6 1.0 - 2.5 (calc)   Total Bilirubin 0.5 0.2 - 1.2 mg/dL   Alkaline phosphatase (APISO) 142 37 - 153 U/L   AST 16 10 - 35 U/L   ALT 16 6 - 29 U/L  Hemoglobin A1c   Collection Time: 01/21/23  2:44 PM  Result Value Ref Range   Hgb A1c MFr Bld 6.1 (H) <5.7 % of total Hgb   Mean Plasma Glucose 128 mg/dL   eAG (mmol/L) 7.1 mmol/L  VITAMIN D 25 Hydroxy (Vit-D Deficiency, Fractures)   Collection Time: 01/21/23  2:44  PM  Result Value Ref Range   Vit D, 25-Hydroxy 34 30 - 100 ng/mL  B12 and Folate Panel   Collection Time: 01/21/23  2:44 PM  Result Value Ref Range   Vitamin B-12 419 200 - 1,100 pg/mL   Folate 9.9 ng/mL       Assessment & Plan:   Problem List Items Addressed This Visit   None Visit Diagnoses       Left arm swelling    -  Primary    Relevant Medications   predniSONE (STERAPRED UNI-PAK 21 TAB) 10 MG (21) TBPK tablet     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 Patient has used Benadryl cream and taken one Benadryl pill. -Prescribe a steroid taper to reduce inflammation. -Advise to continue using Benadryl cream. -Recommend Pepcid twice daily and zyrtec once a day        Follow up plan: Return if symptoms worsen or fail to improve.

## 2023-07-12 NOTE — Telephone Encounter (Signed)
Pt is scheduled with Raynelle Fanning for today ?

## 2023-07-12 NOTE — Telephone Encounter (Signed)
 Copied from CRM [id number]. Topic: Clinical - Medical Advice >> Jul 12, 2023  7:49 AM Clayton Bibles wrote: Reason for CRM: She has ant bites on her left hand from working in yard. She would like for Dr. Carlynn Purl to call her in a medication for ant bites. She has had this medication before. Please call her with any questions. Thanks  Needs appt

## 2023-08-03 ENCOUNTER — Ambulatory Visit (INDEPENDENT_AMBULATORY_CARE_PROVIDER_SITE_OTHER): Payer: PRIVATE HEALTH INSURANCE | Admitting: Nurse Practitioner

## 2023-08-03 ENCOUNTER — Ambulatory Visit: Payer: Self-pay | Admitting: *Deleted

## 2023-08-03 ENCOUNTER — Encounter: Payer: Self-pay | Admitting: Nurse Practitioner

## 2023-08-03 VITALS — BP 114/72 | HR 83 | Resp 18 | Ht 66.0 in | Wt 204.7 lb

## 2023-08-03 DIAGNOSIS — S80861A Insect bite (nonvenomous), right lower leg, initial encounter: Secondary | ICD-10-CM | POA: Diagnosis not present

## 2023-08-03 DIAGNOSIS — W57XXXA Bitten or stung by nonvenomous insect and other nonvenomous arthropods, initial encounter: Secondary | ICD-10-CM

## 2023-08-03 DIAGNOSIS — L03115 Cellulitis of right lower limb: Secondary | ICD-10-CM

## 2023-08-03 MED ORDER — SULFAMETHOXAZOLE-TRIMETHOPRIM 800-160 MG PO TABS: 1.0000 | ORAL_TABLET | Freq: Two times a day (BID) | ORAL | 0 refills | Status: AC

## 2023-08-03 MED ORDER — PREDNISONE 10 MG (21) PO TBPK: ORAL_TABLET | ORAL | 0 refills | Status: DC

## 2023-08-03 NOTE — Progress Notes (Signed)
 BP 114/72   Pulse 83   Resp 18   Ht 5\' 6"  (1.676 m)   Wt 204 lb 11.2 oz (92.9 kg)   SpO2 95%   BMI 33.04 kg/m    Subjective:    Patient ID: Tamara Nelson, female    DOB: 09-23-1965, 58 y.o.   MRN: [id number]  HPI: Tamara Nelson is a 58 y.o. female  Chief Complaint  Patient presents with   Insect Bite    Rt leg    Discussed the use of AI scribe software for clinical note transcription with the patient, who gave verbal consent to proceed.  History of Present Illness Tamara Nelson is a 58 year old female who presents with an insect bite on her right lower leg that appears infected.  While putting out mulch in the flower beds on Saturday, she stepped on something, resulting in an insect bite on her right lower leg. The bite initially had a head, which she popped to allow it to drain. Despite applying Benadryl cream and a baking soda paste, she feels the condition is not improving and notes that it looks infected. The bite is not itchy except when exposed to hot water, such as during a shower. The area becomes warm by the end of the day, likely due to being on her feet.  She suspects the bite may be from small black ants, which she refers to as 'little piss ants'. Her boss had a similar issue in his yard, suggesting a possible environmental exposure to these insects. She has not experienced similar bites elsewhere on her body during this episode, although she recalls a previous incident where she had bites on her wrist and hand.          06/08/2023    3:17 PM 03/31/2023    8:17 AM 03/22/2023    9:59 AM  Depression screen PHQ 2/9  Decreased Interest 0 0 0  Down, Depressed, Hopeless 0 0 0  PHQ - 2 Score 0 0 0  Altered sleeping 0  0  Tired, decreased energy 0  0  Change in appetite 0  0  Feeling bad or failure about yourself  0  0  Trouble concentrating 0  0  Moving slowly or fidgety/restless 0  0  Suicidal thoughts 0  0  PHQ-9 Score 0  0  Difficult doing  work/chores Not difficult at all  Not difficult at all    Relevant past medical, surgical, family and social history reviewed and updated as indicated. Interim medical history since our last visit reviewed. Allergies and medications reviewed and updated.  Review of Systems  Ten systems reviewed and is negative except as mentioned in HPI      Objective:    BP 114/72   Pulse 83   Resp 18   Ht 5\' 6"  (1.676 m)   Wt 204 lb 11.2 oz (92.9 kg)   SpO2 95%   BMI 33.04 kg/m    Wt Readings from Last 3 Encounters:  08/03/23 204 lb 11.2 oz (92.9 kg)  07/12/23 205 lb 3.2 oz (93.1 kg)  06/08/23 205 lb 11.2 oz (93.3 kg)    Physical Exam Physical Exam GENERAL: Alert, cooperative, well developed, no acute distress HEENT: Normocephalic, normal oropharynx, moist mucous membranes CHEST: Clear to auscultation bilaterally, no wheezes, rhonchi, or crackles CARDIOVASCULAR: Normal heart rate and rhythm, S1 and S2 normal without murmurs ABDOMEN: Soft, non-tender, non-distended, without organomegaly, normal bowel sounds EXTREMITIES: No cyanosis or edema,  right lower leg cellulitis, redness, swelling NEUROLOGICAL: Cranial nerves grossly intact, moves all extremities without gross motor or sensory deficit    Results for orders placed or performed in visit on 01/21/23  Cytology - PAP   Collection Time: 01/21/23  2:33 PM  Result Value Ref Range   High risk HPV Negative    Adequacy      Satisfactory for evaluation; transformation zone component PRESENT.   Diagnosis      - Negative for intraepithelial lesion or malignancy (NILM)   Comment Normal Reference Range HPV - Negative   Lipid panel   Collection Time: 01/21/23  2:44 PM  Result Value Ref Range   Cholesterol 176 <200 mg/dL   HDL 44 (L) > OR = 50 mg/dL   Triglycerides 97 <295 mg/dL   LDL Cholesterol (Calc) 112 (H) mg/dL (calc)   Total CHOL/HDL Ratio 4.0 <5.0 (calc)   Non-HDL Cholesterol (Calc) 132 (H) <130 mg/dL (calc)  CBC with  Differential/Platelet   Collection Time: 01/21/23  2:44 PM  Result Value Ref Range   WBC 8.2 3.8 - 10.8 Thousand/uL   RBC 4.69 3.80 - 5.10 Million/uL   Hemoglobin 13.6 11.7 - 15.5 g/dL   HCT 62.1 30.8 - 65.7 %   MCV 88.7 80.0 - 100.0 fL   MCH 29.0 27.0 - 33.0 pg   MCHC 32.7 32.0 - 36.0 g/dL   RDW 84.6 96.2 - 95.2 %   Platelets 278 140 - 400 Thousand/uL   MPV 10.5 7.5 - 12.5 fL   Neutro Abs 5,068 1,500 - 7,800 cells/uL   Lymphs Abs 2,567 850 - 3,900 cells/uL   Absolute Monocytes 467 200 - 950 cells/uL   Eosinophils Absolute 82 15 - 500 cells/uL   Basophils Absolute 16 0 - 200 cells/uL   Neutrophils Relative % 61.8 %   Total Lymphocyte 31.3 %   Monocytes Relative 5.7 %   Eosinophils Relative 1.0 %   Basophils Relative 0.2 %  COMPLETE METABOLIC PANEL WITH GFR   Collection Time: 01/21/23  2:44 PM  Result Value Ref Range   Glucose, Bld 96 65 - 99 mg/dL   BUN 14 7 - 25 mg/dL   Creat 8.41 3.24 - 4.01 mg/dL   eGFR 98 > OR = 60 UU/VOZ/3.66Y4   BUN/Creatinine Ratio SEE NOTE: 6 - 22 (calc)   Sodium 139 135 - 146 mmol/L   Potassium 3.9 3.5 - 5.3 mmol/L   Chloride 104 98 - 110 mmol/L   CO2 23 20 - 32 mmol/L   Calcium 9.5 8.6 - 10.4 mg/dL   Total Protein 7.0 6.1 - 8.1 g/dL   Albumin 4.3 3.6 - 5.1 g/dL   Globulin 2.7 1.9 - 3.7 g/dL (calc)   AG Ratio 1.6 1.0 - 2.5 (calc)   Total Bilirubin 0.5 0.2 - 1.2 mg/dL   Alkaline phosphatase (APISO) 142 37 - 153 U/L   AST 16 10 - 35 U/L   ALT 16 6 - 29 U/L  Hemoglobin A1c   Collection Time: 01/21/23  2:44 PM  Result Value Ref Range   Hgb A1c MFr Bld 6.1 (H) <5.7 % of total Hgb   Mean Plasma Glucose 128 mg/dL   eAG (mmol/L) 7.1 mmol/L  VITAMIN D 25 Hydroxy (Vit-D Deficiency, Fractures)   Collection Time: 01/21/23  2:44 PM  Result Value Ref Range   Vit D, 25-Hydroxy 34 30 - 100 ng/mL  B12 and Folate Panel   Collection Time: 01/21/23  2:44 PM  Result Value Ref  Range   Vitamin B-12 419 200 - 1,100 pg/mL   Folate 9.9 ng/mL        Assessment & Plan:   Problem List Items Addressed This Visit   None Visit Diagnoses       Cellulitis of right lower extremity    -  Primary   Relevant Medications   sulfamethoxazole-trimethoprim (BACTRIM DS) 800-160 MG tablet   predniSONE (STERAPRED UNI-PAK 21 TAB) 10 MG (21) TBPK tablet     Insect bite of right lower leg, initial encounter            Assessment and Plan Assessment & Plan Insect bite with secondary bacterial infection Insect bite on the right lower leg with signs of secondary bacterial infection, including warmth and drainage. Initial self-treatment with Benadryl cream and baking soda paste was ineffective. No itchiness except when exposed to hot water. - Prescribe an antibiotic to treat the infection. Bactrim BID for 7 days - Advise her to report progress in a couple of days. - Instruct her to return for follow-up if the condition worsens.        Follow up plan: Return if symptoms worsen or fail to improve.

## 2023-08-03 NOTE — Telephone Encounter (Signed)
 Copied from CRM [id number]. Topic: Clinical - Red Word Triage >> Aug 03, 2023  7:42 AM Franchot Heidelberg wrote: Red Word that prompted transfer to Nurse Triage: Ant bites Reason for Disposition  [1] Red or very tender (to touch) area AND [2] started over 24 hours after the bite  Answer Assessment - Initial Assessment Questions 1. TYPE of INSECT: "What type of insect was it?"      I was bit by regular ants a month or 2 ago.   I stepped on a nest of ants.    2. ONSET: "When did you get bitten?"      Saturday 3. LOCATION: "Where is the insect bite located?"      It's my right leg.   4. REDNESS: "Is the area red or pink?" If Yes, ask: "What size is area of redness?" (inches or cm). "When did the redness start?"     It's looking black, blue and red.   It's has infection.   It was draining and I popped it and the drainage stopped but the color is not better. 5. PAIN: "Is there any pain?" If Yes, ask: "How bad is it?"  (Scale 1-10; or mild, moderate, severe)     A little burning 6. ITCHING: "Does it itch?" If Yes, ask: "How bad is the itch?"    - MILD: doesn't interfere with normal activities   - MODERATE-SEVERE: interferes with work, school, sleep, or other activities      It itches   I'm using lotion 7. SWELLING: "How big is the swelling?" (inches, cm, or compare to coins)     No not swollen 8. OTHER SYMPTOMS: "Do you have any other symptoms?"  (e.g., difficulty breathing, hives)     No 9. PREGNANCY: "Is there any chance you are pregnant?" "When was your last menstrual period?"     N/A due to age  Protocols used: Insect Bite-A-AH  Chief Complaint: ant bite on right leg that is discolored and looking infected. Symptoms: It's black, blue and red looking. Frequency: Bitten on Saturday Pertinent Negatives: Patient denies drainage or swelling Disposition: [] ED /[] Urgent Care (no appt availability in office) / [x] Appointment(In office/virtual)/ []  [facility]/ [] Home Care/ [] Refused  Recommended Disposition /[] [facility]/ []  Follow-up with PCP Additional Notes: Appt made for this morning with Della Goo, FNP

## 2023-10-18 ENCOUNTER — Encounter: Payer: Self-pay | Admitting: Internal Medicine

## 2023-10-18 ENCOUNTER — Other Ambulatory Visit: Payer: Self-pay

## 2023-10-18 ENCOUNTER — Ambulatory Visit: Payer: PRIVATE HEALTH INSURANCE | Admitting: Internal Medicine

## 2023-10-18 ENCOUNTER — Ambulatory Visit: Payer: Self-pay

## 2023-10-18 VITALS — BP 118/72 | HR 73 | Temp 98.0°F | Resp 16 | Ht 66.0 in | Wt 209.5 lb

## 2023-10-18 DIAGNOSIS — T63424A Toxic effect of venom of ants, undetermined, initial encounter: Secondary | ICD-10-CM | POA: Diagnosis not present

## 2023-10-18 MED ORDER — PREDNISONE 10 MG PO TABS: ORAL_TABLET | ORAL | 0 refills | Status: AC

## 2023-10-18 MED ORDER — CETIRIZINE HCL 10 MG PO TABS: 10.0000 mg | ORAL_TABLET | Freq: Every day | ORAL | 11 refills | Status: DC

## 2023-10-18 NOTE — Progress Notes (Signed)
 Acute Office Visit  Subjective:     Patient ID: Tamara Nelson, female    DOB: 1966-01-14, 58 y.o.   MRN: [id number]  Chief Complaint  Patient presents with   Insect Bite    Red ants on right arm, swollen     HPI Patient is in today for insect bites on her right hand and arm, as well has shoulder, back and abdomen.   Discussed the use of AI scribe software for clinical note transcription with the patient, who gave verbal consent to proceed.  History of Present Illness Tamara Nelson is a 58 year old female with allergies to insect bites who presents with swelling and itching after a red ant bite.  She was bitten by a red ant while gardening, resulting in swelling and blistering at the bite sites, accompanied by bothersome itching. She has experienced similar allergic reactions to insect bites in the past, including an episode of cellulitis requiring antibiotics and steroids. Currently, she has only taken Benadryl, which causes drowsiness. She has not experienced any breathing difficulties or facial swelling with this reaction. Increased ant activity due to recent rain and the absence of protective gardening gloves are noted as potential contributing factors.   Review of Systems  Skin:  Positive for itching and rash.        Objective:    BP 118/72 (Cuff Size: Large)   Pulse 73   Temp 98 F (36.7 C) (Oral)   Resp 16   Ht 5' 6 (1.676 m)   Wt 209 lb 8 oz (95 kg)   SpO2 98%   BMI 33.81 kg/m    Physical Exam Constitutional:      Appearance: Normal appearance.  HENT:     Head: Normocephalic and atraumatic.   Eyes:     Conjunctiva/sclera: Conjunctivae normal.    Cardiovascular:     Rate and Rhythm: Normal rate and regular rhythm.  Pulmonary:     Effort: Pulmonary effort is normal.     Breath sounds: Normal breath sounds.   Skin:    General: Skin is warm and dry.     Findings: Rash present.     Comments: Hives with wheals and surrounding erythema on  back of right hand extending up the arm, right shoulder, upper back and abdomen   Neurological:     General: No focal deficit present.     Mental Status: She is alert. Mental status is at baseline.   Psychiatric:        Mood and Affect: Mood normal.        Behavior: Behavior normal.     No results found for any visits on 10/18/23.      Assessment & Plan:   Assessment & Plan Allergic reaction to ant bites Allergic reaction with swelling and blistering due to histamine release. Less severe than previous episode. Benadryl causes drowsiness. Previous shortness of breath with prednisone  possibly due to dosage or administration speed. - Prescribed Zyrtec to reduce inflammation without sedation. - Prescribed oral steroids: 10 mg for three days, then 5 mg for three days. - Advised Benadryl at night for breakthrough itching. - Attempted Zyrtec prescription; advised OTC purchase if not covered. - Recommended elbow-length gardening gloves to prevent bites. - Instructed to contact if symptoms worsen or persist.  - predniSONE  (DELTASONE ) 10 MG tablet; Take 1 tablet (10 mg total) by mouth daily with breakfast for 3 days, THEN 0.5 tablets (5 mg total) daily with breakfast for 3 days.  Dispense: 4.5 tablet; Refill: 0 - cetirizine (ZYRTEC ALLERGY) 10 MG tablet; Take 1 tablet (10 mg total) by mouth daily.  Dispense: 30 tablet; Refill: 11  Return if symptoms worsen or fail to improve.  Rockney Cid, DO

## 2023-10-18 NOTE — Telephone Encounter (Signed)
 FYI Only or Action Required?: FYI only for provider  Patient was last seen in primary care on 08/03/2023 by Quinton Buckler, FNP. Called Nurse Triage reporting Insect Bite. Symptoms began yesterday. Interventions attempted: OTC medications: benadryl cream and hydrocortisone cream. Symptoms are: gradually worsening.  Triage Disposition: See PCP When Office is Open (Within 3 Days)  Patient/caregiver understands and will follow disposition?: Yes, will follow disposition  Copied from CRM [id number]. Topic: Clinical - Red Word Triage >> Oct 18, 2023  7:49 AM Rosamond Comes wrote: Red Word that prompted transfer to Nurse Triage: patient has right hand, started swelling last night, going a little past wrist, red, itchy Reason for Disposition  [1] SEVERE local itching (i.e., interferes with work, school, sleep) AND [2] not improved after 24 hours of hydrocortisone cream  Answer Assessment - Initial Assessment Questions 1. SEVERITY: How many stings are there?     2-3 2. ONSET: When did it occur?      yesterday 3. LOCATION: Where is the sting located?  How many stings?     R hand 4. SWELLING: How big is the swelling? (e.g., inches or cm)     Yes swelling to wrist 5. REDNESS: Is the area red or pink? If Yes, ask: What size is the area of redness? (e.g., inches or cm). When did the redness start?     redness 6. PAIN: Is there any pain? If Yes, ask: How bad is it?  (Scale 1-10; or mild, moderate, severe)     Not really painful, just itching like fire 7. ITCHING: Is there any itching? If Yes, ask: How bad is it?      mild 8. RESPIRATORY DISTRESS: Describe your breathing.     denies 9. PRIOR REACTIONS: Have you had any severe allergic reactions to stings in the past? If Yes, ask: What happened?     Yes has had rxns in the past with fire ants 10. OTHER SYMPTOMS: Do you have any other symptoms? (e.g., abdomen pain, face or tongue swelling, new rash elsewhere, vomiting)        denies  Protocols used: [employer]

## 2023-10-21 IMAGING — CR DG FINGER MIDDLE 2+V*R*
1 series · 3 of 3 positions shown · non-contrast
Comparison: None.

CLINICAL DATA: Status post trauma with subsequent right middle
finger laceration.

EXAM:
RIGHT MIDDLE FINGER 2+V

[Series 1: dg finger middle right · 0.14mm/px · 3 of 3 slices shown]
[im 1/3]
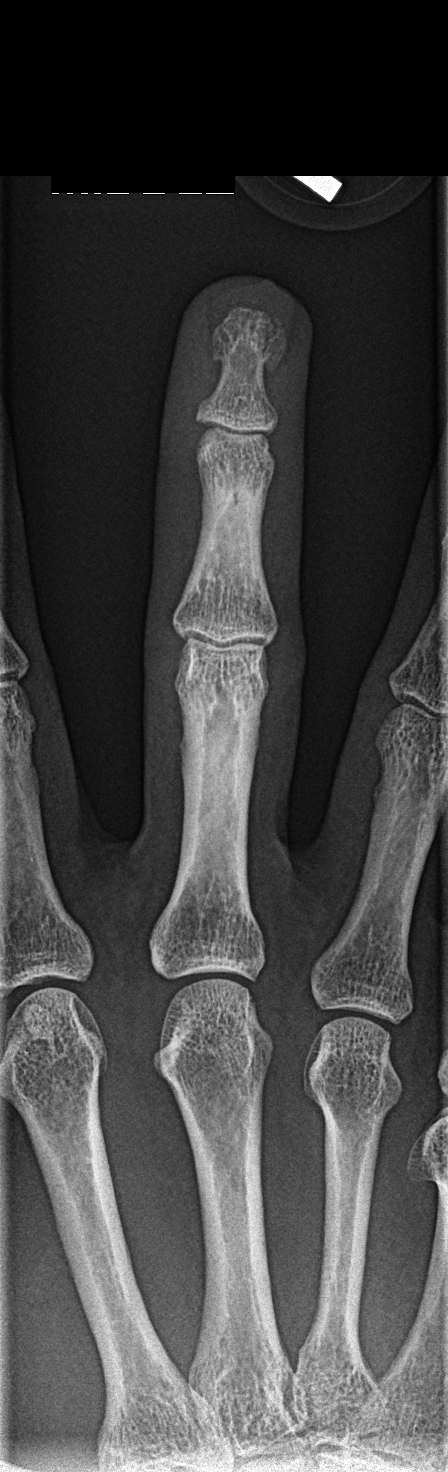
[im 2/3]
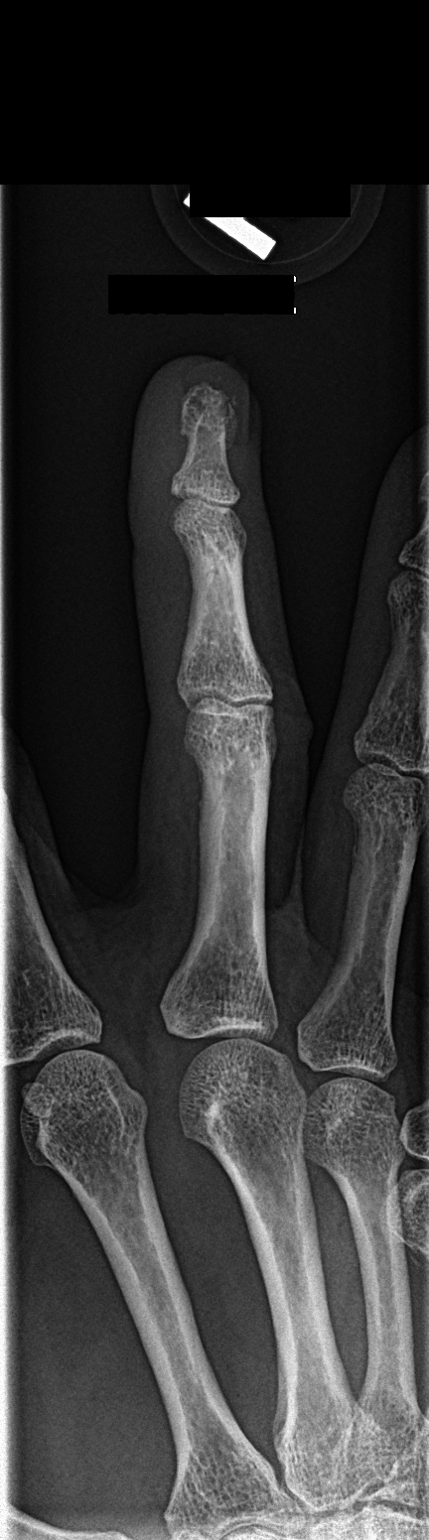
[im 3/3]
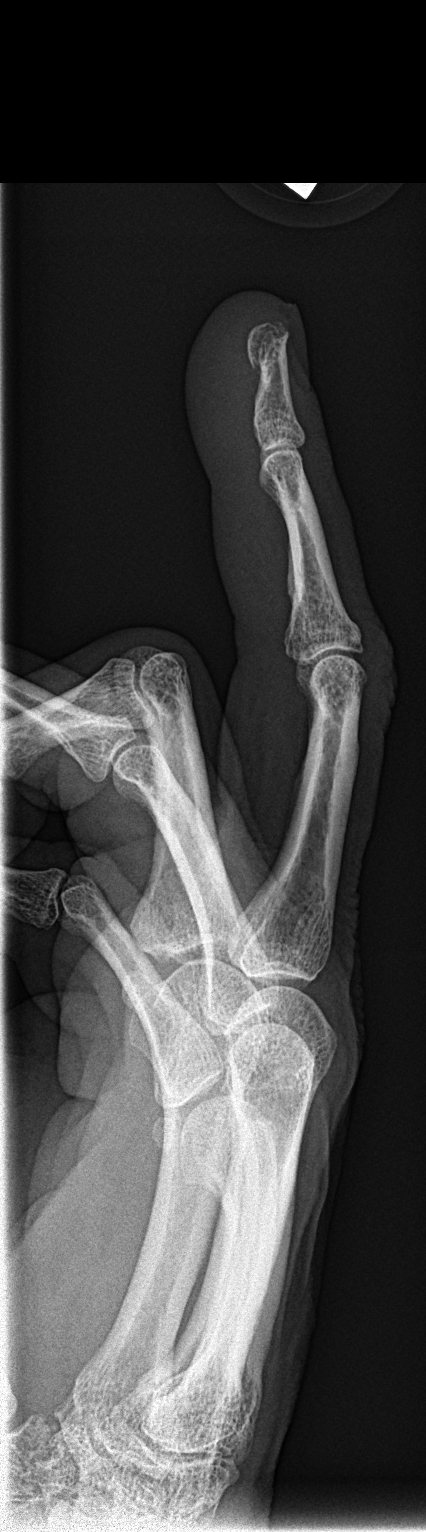

[3 of 3 positions shown; findings below may reference images not displayed]

FINDINGS: Acute, nondisplaced fracture deformity is seen involving the tuft of
the distal phalanx of the third right finger. There is no evidence
of dislocation. There is no evidence of arthropathy or other focal
bone abnormality. Soft tissues are unremarkable.
IMPRESSION: Acute nondisplaced fracture of the distal phalanx of the third right
finger.

## 2023-11-30 ENCOUNTER — Ambulatory Visit: Payer: PRIVATE HEALTH INSURANCE | Admitting: Family Medicine

## 2023-11-30 ENCOUNTER — Encounter: Payer: Self-pay | Admitting: Family Medicine

## 2023-11-30 VITALS — BP 130/74 | HR 64 | Temp 97.8°F | Resp 18 | Ht 66.0 in | Wt 210.3 lb

## 2023-11-30 DIAGNOSIS — E8881 Metabolic syndrome: Secondary | ICD-10-CM

## 2023-11-30 DIAGNOSIS — L959 Vasculitis limited to the skin, unspecified: Secondary | ICD-10-CM | POA: Diagnosis not present

## 2023-11-30 DIAGNOSIS — E538 Deficiency of other specified B group vitamins: Secondary | ICD-10-CM

## 2023-11-30 DIAGNOSIS — B079 Viral wart, unspecified: Secondary | ICD-10-CM | POA: Diagnosis not present

## 2023-11-30 DIAGNOSIS — J3089 Other allergic rhinitis: Secondary | ICD-10-CM | POA: Diagnosis not present

## 2023-11-30 DIAGNOSIS — E785 Hyperlipidemia, unspecified: Secondary | ICD-10-CM | POA: Diagnosis not present

## 2023-11-30 DIAGNOSIS — Z1159 Encounter for screening for other viral diseases: Secondary | ICD-10-CM | POA: Diagnosis not present

## 2023-11-30 DIAGNOSIS — J302 Other seasonal allergic rhinitis: Secondary | ICD-10-CM

## 2023-11-30 DIAGNOSIS — E559 Vitamin D deficiency, unspecified: Secondary | ICD-10-CM

## 2023-11-30 DIAGNOSIS — L853 Xerosis cutis: Secondary | ICD-10-CM | POA: Diagnosis not present

## 2023-11-30 DIAGNOSIS — Z8679 Personal history of other diseases of the circulatory system: Secondary | ICD-10-CM | POA: Diagnosis not present

## 2023-11-30 DIAGNOSIS — Z91030 Bee allergy status: Secondary | ICD-10-CM | POA: Diagnosis not present

## 2023-11-30 MED ORDER — EPINEPHRINE 0.3 MG/0.3ML IJ SOAJ: 0.3000 mg | INTRAMUSCULAR | 1 refills | Status: AC | PRN

## 2023-11-30 MED ORDER — AMMONIUM LACTATE 12 % EX CREA
TOPICAL_CREAM | CUTANEOUS | 0 refills | Status: AC | PRN
Start: 2023-11-30 — End: ?

## 2023-11-30 MED ORDER — LEVOCETIRIZINE DIHYDROCHLORIDE 5 MG PO TABS: 5.0000 mg | ORAL_TABLET | Freq: Every evening | ORAL | 3 refills | Status: AC

## 2023-11-30 NOTE — Progress Notes (Addendum)
 Name: Tamara Nelson   MRN: [id number]    DOB: 1965/12/20   Date:11/30/2023       Progress Note  Subjective  Chief Complaint  Chief Complaint  Patient presents with   Medical Management of Chronic Issues   Hypertension   Discussed the use of AI scribe software for clinical note transcription with the patient, who gave verbal consent to proceed.  History of Present Illness Tamara Nelson is a 58 year old female who presents for a five-month follow-up visit.  She has a history of hypertension, which is currently well-controlled without medication.  She has a history of lipidemia and metabolic syndrome. Her A1c has been trending up, with the last reading at 6.1. She experiences increased thirst, especially in the heat, but no increased hunger or urination.  She experiences perennial allergic rhinitis with seasonal variation, leading to sinus issues and congestion. She switched from Zyrtec  10 mg, which caused drowsiness, to Xyzal , which does not. She also uses desonide  cream and Lacthydrin lotion for dry skin/eczema, particularly in the winter.  She has a history of contact dermatitis, particularly after mowing the lawn, which she treats with topical Benadryl. She reports a rash that appears after mowing, which is fading. She also experiences cramps in her toes after mowing  She has a history of bee allergies and requires an Epipen . She helps a friend with bees, necessitating the Epipen  for safety.  She reports a lesion on her right buttocks, which is occasionally itchy and resembles a wart. It has been present for some time.    Patient Active Problem List   Diagnosis Date Noted   Perennial allergic rhinitis with seasonal variation 02/06/2022   Hypertension, benign 02/06/2022   Diverticulosis 03/17/2018   Nephrolithiasis 03/17/2018   B12 deficiency 10/23/2015   Dyslipidemia 10/21/2015   Bee allergy status 10/17/2014   Abnormal serum level of alkaline phosphatase 10/17/2014    Menopause 10/17/2014   Dysmetabolic syndrome 10/17/2014   Obesity (BMI 30-39.9) 10/17/2014   Vitamin D  deficiency 10/17/2014    Past Surgical History:  Procedure Laterality Date   BREAST BIOPSY Right 04/21/2022   US  RT BREAST BX W LOC DEV 1ST LESION IMG BX SPEC US  GUIDE 04/21/2022 [facility]   COLONOSCOPY WITH PROPOFOL  N/A 11/08/2020   Procedure: COLONOSCOPY WITH PROPOFOL ;  Surgeon: Jinny Carmine, MD;  Location: [facility];  Service: Endoscopy;  Laterality: N/A;    Family History  Problem Relation Age of Onset   Cancer Mother        colon   Cancer Father        Liver and Lung   Stroke Father    Osteoarthritis Sister    Hypertension Brother    Diabetes Brother    Kidney cancer Brother    Diabetes Brother    Vascular Disease Brother    Breast cancer Neg Hx     Social History   Tobacco Use   Smoking status: Never   Smokeless tobacco: Never  Substance Use Topics   Alcohol use: No    Alcohol/week: 0.0 standard drinks of alcohol     Current Outpatient Medications:    ammonium lactate  (LAC-HYDRIN ) 12 % cream, Apply topically as needed for dry skin., Disp: 385 g, Rfl: 0   cetirizine  (ZYRTEC  ALLERGY) 10 MG tablet, Take 1 tablet (10 mg total) by mouth daily., Disp: 30 tablet, Rfl: 11   Cholecalciferol (VITAMIN D ) 2000 UNITS CAPS, Take 1 capsule by mouth as needed., Disp: , Rfl:  desonide  (DESOWEN ) 0.05 % cream, Apply topically 2 (two) times daily., Disp: 30 g, Rfl: 0   EPINEPHrine  (EPIPEN  2-PAK) 0.3 mg/0.3 mL IJ SOAJ injection, Inject 0.3 mg into the muscle as needed for anaphylaxis., Disp: 2 each, Rfl: 1   hydrOXYzine  (VISTARIL ) 25 MG capsule, Take 1 capsule (25 mg total) by mouth every 8 (eight) hours as needed., Disp: 30 capsule, Rfl: 0   triamcinolone  cream (KENALOG ) 0.1 %, Apply topically 2 (two) times daily., Disp: 80 g, Rfl: 1  Allergies  Allergen Reactions   Bee Venom    Mixed Vespid Venom     Other reaction(s): Unknown    I personally reviewed  active problem list, medication list, allergies, family history with the patient/caregiver today.   ROS  Ten systems reviewed and is negative except as mentioned in HPI    Objective Physical Exam CONSTITUTIONAL: Patient appears well-developed and well-nourished. No distress. HEENT: Head atraumatic, normocephalic, neck supple. CARDIOVASCULAR: Normal rate, regular rhythm and normal heart sounds. No murmur heard. No BLE edema. PULMONARY: Effort normal and breath sounds normal. Lungs clear to auscultation bilaterally. No respiratory distress. Skin: exercise induced vasculitis, warty like lesion on right buttocks MUSCULOSKELETAL: Normal gait. Without gross motor or sensory deficit. PSYCHIATRIC: Patient has a normal mood and affect. Behavior is normal. Judgment and thought content normal.     Vitals:   11/30/23 1458  BP: 130/74  Pulse: 64  Resp: 18  Temp: 97.8 F (36.6 C)  SpO2: 97%  Weight: 210 lb 4.8 oz (95.4 kg)  Height: 5' 6 (1.676 m)    Body mass index is 33.94 kg/m.    PHQ2/9:    11/30/2023    3:10 PM 06/08/2023    3:17 PM 03/31/2023    8:17 AM 03/22/2023    9:59 AM 03/05/2023    2:27 PM  Depression screen PHQ 2/9  Decreased Interest 0 0 0 0 0  Down, Depressed, Hopeless 0 0 0 0 0  PHQ - 2 Score 0 0 0 0 0  Altered sleeping 0 0  0 0  Tired, decreased energy 0 0  0 0  Change in appetite 0 0  0 0  Feeling bad or failure about yourself  0 0  0 0  Trouble concentrating 0 0  0 0  Moving slowly or fidgety/restless 0 0  0 0  Suicidal thoughts 0 0  0 0  PHQ-9 Score 0 0  0 0  Difficult doing work/chores Not difficult at all Not difficult at all  Not difficult at all     phq 9 is negative  Fall Risk:    11/30/2023    2:59 PM 06/08/2023    3:17 PM 03/31/2023    8:17 AM 03/22/2023    9:58 AM 03/05/2023    2:26 PM  Fall Risk   Falls in the past year? 0 0 0 0 0  Number falls in past yr: 0 0 0 0   Injury with Fall? 0 0 0 0   Risk for fall due to :  No Fall Risks No  Fall Risks No Fall Risks No Fall Risks  Follow up Falls evaluation completed Falls prevention discussed;Education provided;Falls evaluation completed Falls prevention discussed;Education provided;Falls evaluation completed Falls prevention discussed;Education provided;Falls evaluation completed Falls prevention discussed      Assessment & Plan Metabolic syndrome Metabolic syndrome with elevated blood sugar levels, A1c at 6.1%. - Advise reduction of carbohydrate intake, including potatoes, pasta, and rice. - Encourage physical activity.  Vitamin  B12 deficiency Vitamin B12 deficiency; unclear if patient is currently taking supplements. - Advise resuming or starting B12 supplementation.  Perennial allergic rhinitis with seasonal variation Perennial allergic rhinitis with sinus congestion. Zyrtec  induced drowsiness. - Switch from Zyrtec  to Xyzal  for allergy management.  Contact dermatitis Intermittently  - Use hydrocortisone cream (Cortisone 10) instead of topical Benadryl.  Bee venom allergy Bee venom allergy. - Prescribe Epipen  for emergency use.  Eczema  Dry skin, particularly problematic in winter, managed with Lacthydrin lotion. - Prescribe Lacthydrin lotion for management.  Right buttocks wart Right buttocks wart, occasionally itchy, present for some time. - Schedule separate visit for shave biopsy if desired.  Exercise induced vasculitis -discussed compression stocking hoses when mowing - elevated legs afterwards -ice pack as needed  Prediabetes Prediabetes with A1c previously in prediabetes range, no symptoms of increased hunger, thirst, or urination. - Monitor A1c levels. - Advise dietary modifications to manage blood sugar levels.  Leg cramps due to dehydration and electrolyte loss Leg cramps after lawn mowing, likely due to dehydration and electrolyte loss from sweating. - Advise drinking Gatorade Zero or Propel to replenish electrolytes during hot  weather.  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 - Advise use of compression stockings during prolonged standing or activity in heat.

## 2023-12-01 LAB — HEMOGLOBIN A1C
Hgb A1c MFr Bld: 6.1 % — ABNORMAL HIGH (ref <5.7)
Mean Plasma Glucose: 128 mg/dL
eAG (mmol/L): 7.1 mmol/L

## 2023-12-01 LAB — VITAMIN D 25 HYDROXY (VIT D DEFICIENCY, FRACTURES): Vit D, 25-Hydroxy: 33 ng/mL (ref 30–100)

## 2023-12-01 LAB — HEPATITIS B SURFACE ANTIBODY,QUALITATIVE: Hep B S Ab: NONREACTIVE

## 2023-12-01 LAB — CBC WITH DIFFERENTIAL/PLATELET
Absolute Lymphocytes: 3030 {cells}/uL (ref 850–3900)
Absolute Monocytes: 446 {cells}/uL (ref 200–950)
Basophils Absolute: 18 {cells}/uL (ref 0–200)
Basophils Relative: 0.2 %
Eosinophils Absolute: 109 {cells}/uL (ref 15–500)
Eosinophils Relative: 1.2 %
HCT: 42.1 % (ref 35.0–45.0)
Hemoglobin: 13.7 g/dL (ref 11.7–15.5)
MCH: 29 pg (ref 27.0–33.0)
MCHC: 32.5 g/dL (ref 32.0–36.0)
MCV: 89.2 fL (ref 80.0–100.0)
MPV: 10.8 fL (ref 7.5–12.5)
Monocytes Relative: 4.9 %
Neutro Abs: 5496 {cells}/uL (ref 1500–7800)
Neutrophils Relative %: 60.4 %
Platelets: 252 Thousand/uL (ref 140–400)
RBC: 4.72 Million/uL (ref 3.80–5.10)
RDW: 13 % (ref 11.0–15.0)
Total Lymphocyte: 33.3 %
WBC: 9.1 Thousand/uL (ref 3.8–10.8)

## 2023-12-01 LAB — B12 AND FOLATE PANEL
Folate: 15.5 ng/mL
Vitamin B-12: 393 pg/mL (ref 200–1100)

## 2023-12-01 LAB — COMPREHENSIVE METABOLIC PANEL WITH GFR
AG Ratio: 1.5 (calc) (ref 1.0–2.5)
ALT: 21 U/L (ref 6–29)
AST: 19 U/L (ref 10–35)
Albumin: 4.3 g/dL (ref 3.6–5.1)
Alkaline phosphatase (APISO): 137 U/L (ref 37–153)
BUN: 12 mg/dL (ref 7–25)
CO2: 22 mmol/L (ref 20–32)
Calcium: 9.5 mg/dL (ref 8.6–10.4)
Chloride: 104 mmol/L (ref 98–110)
Creat: 0.69 mg/dL (ref 0.50–1.03)
Globulin: 2.8 g/dL (ref 1.9–3.7)
Glucose, Bld: 92 mg/dL (ref 65–99)
Potassium: 3.8 mmol/L (ref 3.5–5.3)
Sodium: 138 mmol/L (ref 135–146)
Total Bilirubin: 0.4 mg/dL (ref 0.2–1.2)
Total Protein: 7.1 g/dL (ref 6.1–8.1)
eGFR: 101 mL/min/1.73m2 (ref >=60)

## 2023-12-01 LAB — LIPID PANEL
Cholesterol: 189 mg/dL (ref <200)
HDL: 43 mg/dL — ABNORMAL LOW (ref >=50)
LDL Cholesterol (Calc): 125 mg/dL — ABNORMAL HIGH
Non-HDL Cholesterol (Calc): 146 mg/dL — ABNORMAL HIGH (ref <130)
Total CHOL/HDL Ratio: 4.4 (calc) (ref <5.0)
Triglycerides: 99 mg/dL (ref <150)

## 2023-12-03 ENCOUNTER — Ambulatory Visit: Payer: Self-pay | Admitting: Family Medicine

## 2023-12-06 ENCOUNTER — Ambulatory Visit: Payer: PRIVATE HEALTH INSURANCE | Admitting: Family Medicine

## 2023-12-20 ENCOUNTER — Encounter: Payer: Self-pay | Admitting: Family Medicine

## 2023-12-20 ENCOUNTER — Ambulatory Visit (INDEPENDENT_AMBULATORY_CARE_PROVIDER_SITE_OTHER): Payer: PRIVATE HEALTH INSURANCE | Admitting: Family Medicine

## 2023-12-20 VITALS — BP 118/76 | HR 88 | Resp 16 | Ht 66.0 in | Wt 210.2 lb

## 2023-12-20 DIAGNOSIS — J302 Other seasonal allergic rhinitis: Secondary | ICD-10-CM

## 2023-12-20 DIAGNOSIS — R051 Acute cough: Secondary | ICD-10-CM

## 2023-12-20 DIAGNOSIS — J3089 Other allergic rhinitis: Secondary | ICD-10-CM | POA: Diagnosis not present

## 2023-12-20 MED ORDER — BENZONATATE 100 MG PO CAPS: 100.0000 mg | ORAL_CAPSULE | Freq: Three times a day (TID) | ORAL | 0 refills | Status: DC | PRN

## 2023-12-20 MED ORDER — FLUTICASONE PROPIONATE 50 MCG/ACT NA SUSP: 2.0000 | Freq: Every day | NASAL | 1 refills | Status: AC

## 2023-12-20 MED ORDER — AZELASTINE HCL 0.1 % NA SOLN: 2.0000 | Freq: Two times a day (BID) | NASAL | 2 refills | Status: DC

## 2023-12-20 NOTE — Progress Notes (Signed)
 Name: Tamara Nelson   MRN: [id number]    DOB: [birth year]   Date:12/20/2023       Progress Note  Subjective  Chief Complaint  Chief Complaint  Patient presents with   Nasal Congestion    Sx started Thursday, no fever   Cough   Headache    Discussed the use of AI scribe software for clinical note transcription with the patient, who gave verbal consent to proceed.  History of Present Illness Tamara Nelson is a 58 year old female who presents with symptoms of a flu-like illness and allergies.  Symptoms began on Thursday with coughing, sneezing, and a frontal headache. She experiences itching and pain in her ear, describing it as an urge to use a Q-tip due to the discomfort. No shortness of breath or wheezing, but her cough has increased. She denies having a fever.  She has a history of year-round allergies with seasonal variation. She reports that she has not been taking her allergy medication and has been using Benadryl at night to help her sleep. She picked up an empty pen and lotion from the drugstore but did not pick up her allergy medication. She has used nasal sprays in the past and finds them helpful.  She reports sniffing and a runny nose if she wakes up during the night. She prefers not to use medications that cause drowsiness during the day due to her work schedule.  One side of her nose has started to bleed slightly, and she is cautious about using nasal sprays when bleeding occurs. She describes her cough as 'a little wet' and dislikes the dryness it causes in her throat.  She recalls using Tessalon  Perles in the past but cannot remember the details. She prefers medications that do not induce sleepiness during the day.    Patient Active Problem List   Diagnosis Date Noted   Perennial allergic rhinitis with seasonal variation 02/06/2022   Diverticulosis 03/17/2018   Nephrolithiasis 03/17/2018   B12 deficiency 10/23/2015   Dyslipidemia 10/21/2015   Bee allergy  status 10/17/2014   Abnormal serum level of alkaline phosphatase 10/17/2014   Menopause 10/17/2014   Dysmetabolic syndrome 10/17/2014   Obesity (BMI 30-39.9) 10/17/2014   Vitamin D  deficiency 10/17/2014    Social History   Tobacco Use   Smoking status: Never   Smokeless tobacco: Never  Substance Use Topics   Alcohol use: No    Alcohol/week: 0.0 standard drinks of alcohol     Current Outpatient Medications:    ammonium lactate  (LAC-HYDRIN ) 12 % cream, Apply topically as needed for dry skin., Disp: 385 g, Rfl: 0   Cholecalciferol (VITAMIN D ) 2000 UNITS CAPS, Take 1 capsule by mouth as needed., Disp: , Rfl:    desonide  (DESOWEN ) 0.05 % cream, Apply topically 2 (two) times daily., Disp: 30 g, Rfl: 0   EPINEPHrine  (EPIPEN  2-PAK) 0.3 mg/0.3 mL IJ SOAJ injection, Inject 0.3 mg into the muscle as needed for anaphylaxis., Disp: 2 each, Rfl: 1   triamcinolone  cream (KENALOG ) 0.1 %, Apply topically 2 (two) times daily., Disp: 80 g, Rfl: 1   levocetirizine (XYZAL ) 5 MG tablet, Take 1 tablet (5 mg total) by mouth every evening. (Patient not taking: Reported on 12/20/2023), Disp: 90 tablet, Rfl: 3  Allergies  Allergen Reactions   Bee Venom    Mixed Vespid Venom     Other reaction(s): Unknown    ROS  Ten systems reviewed and is negative except as mentioned in HPI  Objective  Vitals:   12/20/23 1121  BP: 118/76  Pulse: 88  Resp: 16  SpO2: 98%  Weight: 210 lb 3.2 oz (95.3 kg)  Height: 5' 6 (1.676 m)    Body mass index is 33.93 kg/m.    Physical Exam CONSTITUTIONAL: Patient appears well-developed and well-nourished. No distress. HEENT: Head atraumatic, normocephalic, neck supple. Ears normal. Boggy turbinates, clear rhinorrhea  CARDIOVASCULAR: Normal rate, regular rhythm and normal heart sounds. No murmur heard. No BLE edema. PULMONARY: Effort normal and breath sounds normal. Lungs clear to auscultation, no wheezing, no crackles. No respiratory distress. ABDOMINAL: There  is no tenderness or distention. MUSCULOSKELETAL: Normal gait. Without gross motor or sensory deficit. PSYCHIATRIC: Patient has a normal mood and affect. Behavior is normal. Judgment and thought content normal.  Recent Results (from the past 2160 hours)  Lipid panel     Status: Abnormal   Collection Time: 11/30/23  4:05 PM  Result Value Ref Range   Cholesterol 189 <200 mg/dL   HDL 43 (L) > OR = 50 mg/dL   Triglycerides 99 <849 mg/dL   LDL Cholesterol (Calc) 125 (H) mg/dL (calc)    Comment: Reference range: <100 . Desirable range <100 mg/dL for primary prevention;   <70 mg/dL for patients with CHD or diabetic patients  with > or = 2 CHD risk factors. SABRA LDL-C is now calculated using the Martin-Hopkins  calculation, which is a validated novel method providing  better accuracy than the Friedewald equation in the  estimation of LDL-C.  Gladis APPLETHWAITE et al. SANDREA. 7986;689(80): 2061-2068  (http://education.[facility].com/faq/FAQ164)    Total CHOL/HDL Ratio 4.4 <5.0 (calc)   Non-HDL Cholesterol (Calc) 146 (H) <130 mg/dL (calc)    Comment: For patients with diabetes plus 1 major ASCVD risk  factor, treating to a non-HDL-C goal of <100 mg/dL  (LDL-C of <29 mg/dL) is considered a therapeutic  option.   CBC with Differential/Platelet     Status: None   Collection Time: 11/30/23  4:05 PM  Result Value Ref Range   WBC 9.1 3.8 - 10.8 Thousand/uL   RBC 4.72 3.80 - 5.10 Million/uL   Hemoglobin 13.7 11.7 - 15.5 g/dL   HCT 57.8 64.9 - 54.9 %   MCV 89.2 80.0 - 100.0 fL   MCH 29.0 27.0 - 33.0 pg   MCHC 32.5 32.0 - 36.0 g/dL    Comment: For adults, a slight decrease in the calculated MCHC value (in the range of 30 to 32 g/dL) is most likely not clinically significant; however, it should be interpreted with caution in correlation with other red cell parameters and the patient's clinical condition.    RDW 13.0 11.0 - 15.0 %   Platelets 252 140 - 400 Thousand/uL   MPV 10.8 7.5 - 12.5 fL    Neutro Abs 5,496 1,500 - 7,800 cells/uL   Absolute Lymphocytes 3,030 850 - 3,900 cells/uL   Absolute Monocytes 446 200 - 950 cells/uL   Eosinophils Absolute 109 15 - 500 cells/uL   Basophils Absolute 18 0 - 200 cells/uL   Neutrophils Relative % 60.4 %   Total Lymphocyte 33.3 %   Monocytes Relative 4.9 %   Eosinophils Relative 1.2 %   Basophils Relative 0.2 %  Comprehensive metabolic panel with GFR     Status: None   Collection Time: 11/30/23  4:05 PM  Result Value Ref Range   Glucose, Bld 92 65 - 99 mg/dL    Comment: .  Fasting reference interval .    BUN 12 7 - 25 mg/dL   Creat 9.30 9.49 - 8.96 mg/dL   eGFR 898 > OR = 60 fO/fpw/8.26f7   BUN/Creatinine Ratio SEE NOTE: 6 - 22 (calc)    Comment:    Not Reported: BUN and Creatinine are within    reference range. .    Sodium 138 135 - 146 mmol/L   Potassium 3.8 3.5 - 5.3 mmol/L   Chloride 104 98 - 110 mmol/L   CO2 22 20 - 32 mmol/L   Calcium 9.5 8.6 - 10.4 mg/dL   Total Protein 7.1 6.1 - 8.1 g/dL   Albumin 4.3 3.6 - 5.1 g/dL   Globulin 2.8 1.9 - 3.7 g/dL (calc)   AG Ratio 1.5 1.0 - 2.5 (calc)   Total Bilirubin 0.4 0.2 - 1.2 mg/dL   Alkaline phosphatase (APISO) 137 37 - 153 U/L   AST 19 10 - 35 U/L   ALT 21 6 - 29 U/L  Hemoglobin A1c     Status: Abnormal   Collection Time: 11/30/23  4:05 PM  Result Value Ref Range   Hgb A1c MFr Bld 6.1 (H) <5.7 %    Comment: For someone without known diabetes, a hemoglobin  A1c value between 5.7% and 6.4% is consistent with prediabetes and should be confirmed with a  follow-up test. . For someone with known diabetes, a value <7% indicates that their diabetes is well controlled. A1c targets should be individualized based on duration of diabetes, age, comorbid conditions, and other considerations. . This assay result is consistent with an increased risk of diabetes. . Currently, no consensus exists regarding use of hemoglobin A1c for diagnosis of diabetes for  children. .    Mean Plasma Glucose 128 mg/dL   eAG (mmol/L) 7.1 mmol/L  VITAMIN D  25 Hydroxy (Vit-D Deficiency, Fractures)     Status: None   Collection Time: 11/30/23  4:05 PM  Result Value Ref Range   Vit D, 25-Hydroxy 33 30 - 100 ng/mL    Comment: Vitamin D  Status         25-OH Vitamin D : . Deficiency:                    <20 ng/mL Insufficiency:             20 - 29 ng/mL Optimal:                 > or = 30 ng/mL . For 25-OH Vitamin D  testing on patients on  D2-supplementation and patients for whom quantitation  of D2 and D3 fractions is required, the [facility](TM) 25-OH VIT D, (D2,D3), LC/MS/MS is recommended: order  code 07111 (patients >67yrs). . See Note 1 . Note 1 . For additional information, please refer to  http://education.[facility].com/faq/FAQ199  (This link is being provided for informational/ educational purposes only.)   B12 and Folate Panel     Status: None   Collection Time: 11/30/23  4:05 PM  Result Value Ref Range   Vitamin B-12 393 200 - 1,100 pg/mL    Comment: . Please Note: Although the reference range for vitamin B12 is [phone] pg/mL, it has been reported that between 5 and 10% of patients with values between 200 and 400 pg/mL may experience neuropsychiatric and hematologic abnormalities due to occult B12 deficiency; less than 1% of patients with values above 400 pg/mL will have symptoms. .    Folate 15.5 ng/mL    Comment:  Reference Range                            Low:           <3.4                            Borderline:    3.4-5.4                            Normal:        >5.4 .   Hepatitis B surface antibody,qualitative     Status: None   Collection Time: 11/30/23  4:05 PM  Result Value Ref Range   Hep B S Ab NON-REACTIVE NON-REACTIVE      Assessment & Plan Allergic rhinitis with acute cough  - Resume levocetirizine. - Use OTC antihistamines if Xyzal  not covered. - Use nasal sprays as needed,  one for daytime, one for nighttime. - Prescribe Tessalon  Perles for daytime cough. - Use Benadryl at night for sleep if needed. - Avoid nasal spray if epistaxis occurs. - Instruct to message via MyChart if symptoms worsen in 3-4 days for possible antibiotic prescription

## 2024-01-21 NOTE — Patient Instructions (Signed)
 Preventive Care 57-57 Years Old, Female  Preventive care refers to lifestyle choices and visits with your health care provider that can promote health and wellness. Preventive care visits are also called wellness exams.  What can I expect for my preventive care visit?  Counseling  Your health care provider may ask you questions about your:  Medical history, including:  Past medical problems.  Family medical history.  Pregnancy history.  Current health, including:  Menstrual cycle.  Method of birth control.  Emotional well-being.  Home life and relationship well-being.  Sexual activity and sexual health.  Lifestyle, including:  Alcohol, nicotine or tobacco, and drug use.  Access to firearms.  Diet, exercise, and sleep habits.  Work and work Astronomer.  Sunscreen use.  Safety issues such as seatbelt and bike helmet use.  Physical exam  Your health care provider will check your:  Height and weight. These may be used to calculate your BMI (body mass index). BMI is a measurement that tells if you are at a healthy weight.  Waist circumference. This measures the distance around your waistline. This measurement also tells if you are at a healthy weight and may help predict your risk of certain diseases, such as type 2 diabetes and high blood pressure.  Heart rate and blood pressure.  Body temperature.  Skin for abnormal spots.  What immunizations do I need?    Vaccines are usually given at various ages, according to a schedule. Your health care provider will recommend vaccines for you based on your age, medical history, and lifestyle or other factors, such as travel or where you work.  What tests do I need?  Screening  Your health care provider may recommend screening tests for certain conditions. This may include:  Lipid and cholesterol levels.  Diabetes screening. This is done by checking your blood sugar (glucose) after you have not eaten for a while (fasting).  Pelvic exam and Pap test.  Hepatitis B test.  Hepatitis C  test.  HIV (human immunodeficiency virus) test.  STI (sexually transmitted infection) testing, if you are at risk.  Lung cancer screening.  Colorectal cancer screening.  Mammogram. Talk with your health care provider about when you should start having regular mammograms. This may depend on whether you have a family history of breast cancer.  BRCA-related cancer screening. This may be done if you have a family history of breast, ovarian, tubal, or peritoneal cancers.  Bone density scan. This is done to screen for osteoporosis.  Talk with your health care provider about your test results, treatment options, and if necessary, the need for more tests.  Follow these instructions at home:  Eating and drinking    Eat a diet that includes fresh fruits and vegetables, whole grains, lean protein, and low-fat dairy products.  Take vitamin and mineral supplements as recommended by your health care provider.  Do not drink alcohol if:  Your health care provider tells you not to drink.  You are pregnant, may be pregnant, or are planning to become pregnant.  If you drink alcohol:  Limit how much you have to 0-1 drink a day.  Know how much alcohol is in your drink. In the [place] one drink equals one 12 oz bottle of beer (355 mL), one 5 oz glass of wine (148 mL), or one 1 oz glass of hard liquor (44 mL).  Lifestyle  Brush your teeth every morning and night with fluoride toothpaste. Floss one time each day.  Exercise for at least  30 minutes 5 or more days each week.  Do not use any products that contain nicotine or tobacco. These products include cigarettes, chewing tobacco, and vaping devices, such as e-cigarettes. If you need help quitting, ask your health care provider.  Do not use drugs.  If you are sexually active, practice safe sex. Use a condom or other form of protection to prevent STIs.  If you do not wish to become pregnant, use a form of birth control. If you plan to become pregnant, see your health care provider for a  prepregnancy visit.  Take aspirin only as told by your health care provider. Make sure that you understand how much to take and what form to take. Work with your health care provider to find out whether it is safe and beneficial for you to take aspirin daily.  Find healthy ways to manage stress, such as:  Meditation, yoga, or listening to music.  Journaling.  Talking to a trusted person.  Spending time with friends and family.  Minimize exposure to UV radiation to reduce your risk of skin cancer.  Safety  Always wear your seat belt while driving or riding in a vehicle.  Do not drive:  If you have been drinking alcohol. Do not ride with someone who has been drinking.  When you are tired or distracted.  While texting.  If you have been using any mind-altering substances or drugs.  Wear a helmet and other protective equipment during sports activities.  If you have firearms in your house, make sure you follow all gun safety procedures.  Seek help if you have been physically or sexually abused.  What's next?  Visit your health care provider once a year for an annual wellness visit.  Ask your health care provider how often you should have your eyes and teeth checked.  Stay up to date on all vaccines.  This information is not intended to replace advice given to you by your health care provider. Make sure you discuss any questions you have with your health care provider.  Document Revised: 10/16/2020 Document Reviewed: 10/16/2020  Elsevier Patient Education  2024 [employer].

## 2024-01-24 ENCOUNTER — Encounter: Payer: Self-pay | Admitting: Family Medicine

## 2024-01-24 ENCOUNTER — Ambulatory Visit (INDEPENDENT_AMBULATORY_CARE_PROVIDER_SITE_OTHER): Payer: PRIVATE HEALTH INSURANCE | Admitting: Family Medicine

## 2024-01-24 VITALS — BP 124/78 | HR 73 | Resp 16 | Ht 66.0 in | Wt 213.2 lb

## 2024-01-24 DIAGNOSIS — Z840 Family history of diseases of the skin and subcutaneous tissue: Secondary | ICD-10-CM

## 2024-01-24 DIAGNOSIS — Z0000 Encounter for general adult medical examination without abnormal findings: Secondary | ICD-10-CM | POA: Diagnosis not present

## 2024-01-24 DIAGNOSIS — Z23 Encounter for immunization: Secondary | ICD-10-CM | POA: Diagnosis not present

## 2024-01-24 DIAGNOSIS — Z1231 Encounter for screening mammogram for malignant neoplasm of breast: Secondary | ICD-10-CM

## 2024-01-24 NOTE — Progress Notes (Signed)
 Name: Tamara Nelson   MRN: [id number]    DOB: Dec 14, 1965   Date:01/24/2024       Progress Note  Subjective  Chief Complaint  Chief Complaint  Patient presents with   Annual Exam    HPI  Patient presents for annual CPE.  Diet: balanced  Exercise: she works in her yard on the weekends - 90 to 120 per weekend Last Eye Exam: encouraged to complete Last Dental Exam: completed  Flowsheet Row Office Visit from 01/24/2024 in [facility]  AUDIT-C Score 0   Depression: Phq 9 is  negative    01/24/2024    2:41 PM 12/20/2023   11:17 AM 11/30/2023    3:10 PM 06/08/2023    3:17 PM 03/31/2023    8:17 AM  Depression screen PHQ 2/9  Decreased Interest 0 0 0 0 0  Down, Depressed, Hopeless 0 0 0 0 0  PHQ - 2 Score 0 0 0 0 0  Altered sleeping 0 0 0 0   Tired, decreased energy 0 0 0 0   Change in appetite 0 0 0 0   Feeling bad or failure about yourself  0 0 0 0   Trouble concentrating 0 0 0 0   Moving slowly or fidgety/restless 0 0 0 0   Suicidal thoughts 0 0 0 0   PHQ-9 Score 0 0 0 0   Difficult doing work/chores Not difficult at all Not difficult at all Not difficult at all Not difficult at all    Hypertension: BP Readings from Last 3 Encounters:  01/24/24 124/78  12/20/23 118/76  11/30/23 130/74   Obesity: Wt Readings from Last 3 Encounters:  01/24/24 213 lb 3.2 oz (96.7 kg)  12/20/23 210 lb 3.2 oz (95.3 kg)  11/30/23 210 lb 4.8 oz (95.4 kg)   BMI Readings from Last 3 Encounters:  01/24/24 34.41 kg/m  12/20/23 33.93 kg/m  11/30/23 33.94 kg/m     Vaccines: reviewed with the patient. Hepatitis B today and PCV 20 and flu in October   Hep C Screening: completed STD testing and prevention (HIV/chl/gon/syphilis): N/A Intimate partner violence: negative screen  Sexual History : not sexually active  Menstrual History/LMP/Abnormal Bleeding: post menopausal , she does not want HRT Discussed importance of follow up if any post-menopausal bleeding:  yes  Incontinence Symptoms: negative for symptoms   Breast cancer:  - Last Mammogram: due in Dec - BRCA gene screening: N/A  Osteoporosis Prevention : Discussed high calcium and vitamin D  supplementation, weight bearing exercises Bone density :yes - patient wants to wait until age 36 yo  Cervical cancer screening: up-to-date  Skin cancer: Discussed monitoring for atypical lesions  Colorectal cancer: repeat in 2027   Lung cancer:  Low Dose CT Chest recommended if Age 76-80 years, 20 pack-year currently smoking OR have quit w/in 15years. Patient does not qualify for screen   ECG: 2022  Advanced Care Planning: A voluntary discussion about advance care planning including the explanation and discussion of advance directives.  Discussed health care proxy and Living will, and the patient was able to identify a health care proxy as sister .  Patient does not have a living will and power of attorney of health care   Patient Active Problem List   Diagnosis Date Noted   Perennial allergic rhinitis with seasonal variation 02/06/2022   Diverticulosis 03/17/2018   Nephrolithiasis 03/17/2018   B12 deficiency 10/23/2015   Dyslipidemia 10/21/2015   Bee allergy status 10/17/2014  Abnormal serum level of alkaline phosphatase 10/17/2014   Menopause 10/17/2014   Dysmetabolic syndrome 10/17/2014   Obesity (BMI 30-39.9) 10/17/2014   Vitamin D  deficiency 10/17/2014    Past Surgical History:  Procedure Laterality Date   BREAST BIOPSY Right 04/21/2022   US  RT BREAST BX W LOC DEV 1ST LESION IMG BX SPEC US  GUIDE 04/21/2022 [facility]   COLONOSCOPY WITH PROPOFOL  N/A 11/08/2020   Procedure: COLONOSCOPY WITH PROPOFOL ;  Surgeon: Jinny Carmine, MD;  Location: [facility];  Service: Endoscopy;  Laterality: N/A;    Family History  Problem Relation Age of Onset   Cancer Mother        colon   Cancer Father        Liver and Lung   Stroke Father    Osteoarthritis Sister    Hypertension Brother     Diabetes Brother    Kidney cancer Brother    Diabetes Brother    Vascular Disease Brother    Breast cancer Neg Hx     Social History   Socioeconomic History   Marital status: Single    Spouse name: Not on file   Number of children: 0   Years of education: Not on file   Highest education level: High school graduate  Occupational History   Occupation: Research scientist (medical)     Employer:  [employer]  Tobacco Use   Smoking status: Never   Smokeless tobacco: Never  Vaping Use   Vaping status: Never Used  Substance and Sexual Activity   Alcohol use: No    Alcohol/week: 0.0 standard drinks of alcohol   Drug use: No   Sexual activity: Never  Other Topics Concern   Not on file  Social History Narrative   Lives alone, works at [employer] during the day and for [employer] at night, 5 nights weekly    Social Drivers of Health   Financial Resource Strain: Low Risk  (01/24/2024)   Overall Financial Resource Strain (CARDIA)    Difficulty of Paying Living Expenses: Not hard at all  Food Insecurity: No Food Insecurity (01/24/2024)   Hunger Vital Sign    Worried About Running Out of Food in the Last Year: Never true    Ran Out of Food in the Last Year: Never true  Transportation Needs: No Transportation Needs (01/24/2024)   PRAPARE - Administrator, Civil Service (Medical): No    Lack of Transportation (Non-Medical): No  Physical Activity: Insufficiently Active (01/24/2024)   Exercise Vital Sign    Days of Exercise per Week: 5 days    Minutes of Exercise per Session: 20 min  Stress: No Stress Concern Present (01/24/2024)   [employer] of Occupational Health - Occupational Stress Questionnaire    Feeling of Stress: Only a little  Social Connections: Socially Isolated (01/24/2024)   Social Connection and Isolation Panel    Frequency of Communication with Friends and Family: More than three times a week    Frequency of Social Gatherings with Friends  and Family: More than three times a week    Attends Religious Services: Never    Database administrator or Organizations: No    Attends Banker Meetings: Never    Marital Status: Never married  Intimate Partner Violence: Not At Risk (01/24/2024)   Humiliation, Afraid, Rape, and Kick questionnaire    Fear of Current or Ex-Partner: No    Emotionally Abused: No    Physically Abused: No    Sexually Abused: No  Current Outpatient Medications:    ammonium lactate  (LAC-HYDRIN ) 12 % cream, Apply topically as needed for dry skin., Disp: 385 g, Rfl: 0   azelastine  (ASTELIN ) 0.1 % nasal spray, Place 2 sprays into both nostrils 2 (two) times daily. Use in each nostril as directed, Disp: 30 mL, Rfl: 2   Cholecalciferol (VITAMIN D ) 2000 UNITS CAPS, Take 1 capsule by mouth as needed., Disp: , Rfl:    desonide  (DESOWEN ) 0.05 % cream, Apply topically 2 (two) times daily., Disp: 30 g, Rfl: 0   EPINEPHrine  (EPIPEN  2-PAK) 0.3 mg/0.3 mL IJ SOAJ injection, Inject 0.3 mg into the muscle as needed for anaphylaxis., Disp: 2 each, Rfl: 1   fluticasone  (FLONASE ) 50 MCG/ACT nasal spray, Place 2 sprays into both nostrils daily., Disp: 48 g, Rfl: 1   levocetirizine (XYZAL ) 5 MG tablet, Take 1 tablet (5 mg total) by mouth every evening. (Patient taking differently: Take 5 mg by mouth as needed for allergies.), Disp: 90 tablet, Rfl: 3   triamcinolone  cream (KENALOG ) 0.1 %, Apply topically 2 (two) times daily., Disp: 80 g, Rfl: 1   benzonatate  (TESSALON ) 100 MG capsule, Take 1-2 capsules (100-200 mg total) by mouth 3 (three) times daily as needed for cough. (Patient not taking: Reported on 01/24/2024), Disp: 40 capsule, Rfl: 0  Allergies  Allergen Reactions   Bee Venom    Mixed Vespid Venom     Other reaction(s): Unknown     ROS  Constitutional: Negative for fever or significant  weight change.  Respiratory: Negative for cough and shortness of breath.   Cardiovascular: Negative for chest pain or  palpitations.  Gastrointestinal: Negative for abdominal pain, no bowel changes.  Musculoskeletal: Negative for gait problem or joint swelling.  Skin: Negative for rash.  Neurological: Negative for dizziness or headache.  No other specific complaints in a complete review of systems (except as listed in HPI above).   Objective  Vitals:   01/24/24 1447  BP: 124/78  Pulse: 73  Resp: 16  SpO2: 98%  Weight: 213 lb 3.2 oz (96.7 kg)  Height: 5' 6 (1.676 m)    Body mass index is 34.41 kg/m.  Physical Exam  Constitutional: Patient appears well-developed and well-nourished. No distress.  HENT: Head: Normocephalic and atraumatic. Ears: B TMs ok, no erythema or effusion; Nose: Nose normal. Mouth/Throat: Oropharynx is clear and moist. No oropharyngeal exudate.  Eyes: Conjunctivae and EOM are normal. Pupils are equal, round, and reactive to light. No scleral icterus.  Neck: Normal range of motion. Neck supple. No JVD present. No thyromegaly present.  Cardiovascular: Normal rate, regular rhythm and normal heart sounds.  No murmur heard. No BLE edema. Pulmonary/Chest: Effort normal and breath sounds normal. No respiratory distress. Abdominal: Soft. Bowel sounds are normal, no distension. There is no tenderness. no masses Breast: no lumps or masses, no nipple discharge or rashes FEMALE GENITALIA:  Not done  RECTAL: not done  Musculoskeletal: Normal range of motion, no joint effusions. No gross deformities Neurological: he is alert and oriented to person, place, and time. No cranial nerve deficit. Coordination, balance, strength, speech and gait are normal.  Skin: Skin is warm and dry. 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 behavior is normal. Judgment and thought content normal.     Assessment & Plan  1. Well adult exam  (Primary)  2. Encounter for screening mammogram for malignant neoplasm of breast  - MM 3D SCREENING MAMMOGRAM BILATERAL BREAST; Future  3. Family history of lupus erythematosus  No symptoms   4. Immunization due  - Heplisav-B  (HepB-CPG) Vaccine   -USPSTF grade A and B recommendations reviewed with patient; age-appropriate recommendations, preventive care, screening tests, etc discussed and encouraged; healthy living encouraged; see AVS for patient education given to patient -Discussed importance of 150 minutes of physical activity weekly, eat two servings of fish weekly, eat one serving of tree nuts ( cashews, pistachios, pecans, almonds.SABRA) every other day, eat 6 servings of fruit/vegetables daily and drink plenty of water  and avoid sweet beverages.   -Reviewed Health Maintenance: Yes.

## 2024-02-07 ENCOUNTER — Encounter: Payer: Self-pay | Admitting: Family Medicine

## 2024-02-07 ENCOUNTER — Ambulatory Visit: Payer: PRIVATE HEALTH INSURANCE | Admitting: Family Medicine

## 2024-02-07 ENCOUNTER — Other Ambulatory Visit (HOSPITAL_COMMUNITY)
Admission: RE | Admit: 2024-02-07 | Discharge: 2024-02-07 | Disposition: A | Discharge status: Home / Self Care | Payer: PRIVATE HEALTH INSURANCE | Source: Ambulatory Visit | Attending: Family Medicine | Admitting: Family Medicine

## 2024-02-07 VITALS — BP 114/74 | HR 75 | Resp 16 | Ht 66.0 in | Wt 211.3 lb

## 2024-02-07 DIAGNOSIS — L989 Disorder of the skin and subcutaneous tissue, unspecified: Secondary | ICD-10-CM | POA: Insufficient documentation

## 2024-02-07 DIAGNOSIS — D485 Neoplasm of uncertain behavior of skin: Secondary | ICD-10-CM | POA: Diagnosis not present

## 2024-02-07 DIAGNOSIS — Z23 Encounter for immunization: Secondary | ICD-10-CM | POA: Diagnosis not present

## 2024-02-07 MED ORDER — LIDOCAINE HCL (PF) 1 % IJ SOLN
2.0000 mL | Freq: Once | INTRAMUSCULAR | Status: AC
  Administered 2024-02-07: 2 mL

## 2024-02-07 NOTE — Progress Notes (Signed)
 Name: Tamara Nelson   MRN: [id number]    DOB: 20-Jul-1965   Date:02/07/2024       Progress Note  Subjective  Chief Complaint  Chief Complaint  Patient presents with   shave biopsy    Discussed the use of AI scribe software for clinical note transcription with the patient, who gave verbal consent to proceed.  History of Present Illness Tamara Nelson is a 58 year old female who presents with a growing and itching lesion on her right buttocks.  Approximately two years ago, she noticed a spot on her right buttocks. Over time, the lesion has been growing and has become itchy. It is described as a solitary lesion.    Patient Active Problem List   Diagnosis Date Noted   Perennial allergic rhinitis with seasonal variation 02/06/2022   Diverticulosis 03/17/2018   Nephrolithiasis 03/17/2018   B12 deficiency 10/23/2015   Dyslipidemia 10/21/2015   Bee allergy status 10/17/2014   Abnormal serum level of alkaline phosphatase 10/17/2014   Menopause 10/17/2014   Dysmetabolic syndrome 10/17/2014   Obesity (BMI 30-39.9) 10/17/2014   Vitamin D  deficiency 10/17/2014    Social History   Tobacco Use   Smoking status: Never   Smokeless tobacco: Never  Substance Use Topics   Alcohol use: No    Alcohol/week: 0.0 standard drinks of alcohol     Current Outpatient Medications:    ammonium lactate  (LAC-HYDRIN ) 12 % cream, Apply topically as needed for dry skin., Disp: 385 g, Rfl: 0   azelastine  (ASTELIN ) 0.1 % nasal spray, Place 2 sprays into both nostrils 2 (two) times daily. Use in each nostril as directed, Disp: 30 mL, Rfl: 2   Cholecalciferol (VITAMIN D ) 2000 UNITS CAPS, Take 1 capsule by mouth as needed., Disp: , Rfl:    desonide  (DESOWEN ) 0.05 % cream, Apply topically 2 (two) times daily., Disp: 30 g, Rfl: 0   EPINEPHrine  (EPIPEN  2-PAK) 0.3 mg/0.3 mL IJ SOAJ injection, Inject 0.3 mg into the muscle as needed for anaphylaxis., Disp: 2 each, Rfl: 1   fluticasone  (FLONASE ) 50  MCG/ACT nasal spray, Place 2 sprays into both nostrils daily., Disp: 48 g, Rfl: 1   levocetirizine (XYZAL ) 5 MG tablet, Take 1 tablet (5 mg total) by mouth every evening. (Patient taking differently: Take 5 mg by mouth as needed for allergies.), Disp: 90 tablet, Rfl: 3   triamcinolone  cream (KENALOG ) 0.1 %, Apply topically 2 (two) times daily., Disp: 80 g, Rfl: 1  Current Facility-Administered Medications:    lidocaine  (PF) (XYLOCAINE ) 1 % injection 2 mL, 2 mL, Other, Once,   Allergies  Allergen Reactions   Bee Venom    Mixed Vespid Venom     Other reaction(s): Unknown    ROS  Ten systems reviewed and is negative except as mentioned in HPI    Objective  Vitals:   02/07/24 1532  BP: 114/74  Pulse: 75  Resp: 16  SpO2: 97%  Weight: 211 lb 4.8 oz (95.8 kg)  Height: 5' 6 (1.676 m)    Body mass index is 34.1 kg/m.    Physical Exam CONSTITUTIONAL: Patient appears well-developed and well-nourished. No distress. SKIN: Lesion on right buttocks, growing and itching.   Recent Results (from the past 2160 hours)  Lipid panel     Status: Abnormal   Collection Time: 11/30/23  4:05 PM  Result Value Ref Range   Cholesterol 189 <200 mg/dL   HDL 43 (L) > OR = 50 mg/dL   Triglycerides 99 <849  mg/dL   LDL Cholesterol (Calc) 125 (H) mg/dL (calc)    Comment: Reference range: <100 . Desirable range <100 mg/dL for primary prevention;   <70 mg/dL for patients with CHD or diabetic patients  with > or = 2 CHD risk factors. SABRA LDL-C is now calculated using the Martin-Hopkins  calculation, which is a validated novel method providing  better accuracy than the Friedewald equation in the  estimation of LDL-C.  Gladis APPLETHWAITE et al. SANDREA. 7986;689(80): 2061-2068  (http://education.[facility].com/faq/FAQ164)    Total CHOL/HDL Ratio 4.4 <5.0 (calc)   Non-HDL Cholesterol (Calc) 146 (H) <130 mg/dL (calc)    Comment: For patients with diabetes plus 1 major ASCVD risk  factor, treating to a  non-HDL-C goal of <100 mg/dL  (LDL-C of <29 mg/dL) is considered a therapeutic  option.   CBC with Differential/Platelet     Status: None   Collection Time: 11/30/23  4:05 PM  Result Value Ref Range   WBC 9.1 3.8 - 10.8 Thousand/uL   RBC 4.72 3.80 - 5.10 Million/uL   Hemoglobin 13.7 11.7 - 15.5 g/dL   HCT 57.8 64.9 - 54.9 %   MCV 89.2 80.0 - 100.0 fL   MCH 29.0 27.0 - 33.0 pg   MCHC 32.5 32.0 - 36.0 g/dL    Comment: For adults, a slight decrease in the calculated MCHC value (in the range of 30 to 32 g/dL) is most likely not clinically significant; however, it should be interpreted with caution in correlation with other red cell parameters and the patient's clinical condition.    RDW 13.0 11.0 - 15.0 %   Platelets 252 140 - 400 Thousand/uL   MPV 10.8 7.5 - 12.5 fL   Neutro Abs 5,496 1,500 - 7,800 cells/uL   Absolute Lymphocytes 3,030 850 - 3,900 cells/uL   Absolute Monocytes 446 200 - 950 cells/uL   Eosinophils Absolute 109 15 - 500 cells/uL   Basophils Absolute 18 0 - 200 cells/uL   Neutrophils Relative % 60.4 %   Total Lymphocyte 33.3 %   Monocytes Relative 4.9 %   Eosinophils Relative 1.2 %   Basophils Relative 0.2 %  Comprehensive metabolic panel with GFR     Status: None   Collection Time: 11/30/23  4:05 PM  Result Value Ref Range   Glucose, Bld 92 65 - 99 mg/dL    Comment: .            Fasting reference interval .    BUN 12 7 - 25 mg/dL   Creat 9.30 9.49 - 8.96 mg/dL   eGFR 898 > OR = 60 fO/fpw/8.26f7   BUN/Creatinine Ratio SEE NOTE: 6 - 22 (calc)    Comment:    Not Reported: BUN and Creatinine are within    reference range. .    Sodium 138 135 - 146 mmol/L   Potassium 3.8 3.5 - 5.3 mmol/L   Chloride 104 98 - 110 mmol/L   CO2 22 20 - 32 mmol/L   Calcium 9.5 8.6 - 10.4 mg/dL   Total Protein 7.1 6.1 - 8.1 g/dL   Albumin 4.3 3.6 - 5.1 g/dL   Globulin 2.8 1.9 - 3.7 g/dL (calc)   AG Ratio 1.5 1.0 - 2.5 (calc)   Total Bilirubin 0.4 0.2 - 1.2 mg/dL   Alkaline  phosphatase (APISO) 137 37 - 153 U/L   AST 19 10 - 35 U/L   ALT 21 6 - 29 U/L  Hemoglobin A1c     Status: Abnormal  Collection Time: 11/30/23  4:05 PM  Result Value Ref Range   Hgb A1c MFr Bld 6.1 (H) <5.7 %    Comment: For someone without known diabetes, a hemoglobin  A1c value between 5.7% and 6.4% is consistent with prediabetes and should be confirmed with a  follow-up test. . For someone with known diabetes, a value <7% indicates that their diabetes is well controlled. A1c targets should be individualized based on duration of diabetes, age, comorbid conditions, and other considerations. . This assay result is consistent with an increased risk of diabetes. . Currently, no consensus exists regarding use of hemoglobin A1c for diagnosis of diabetes for children. .    Mean Plasma Glucose 128 mg/dL   eAG (mmol/L) 7.1 mmol/L  VITAMIN D  25 Hydroxy (Vit-D Deficiency, Fractures)     Status: None   Collection Time: 11/30/23  4:05 PM  Result Value Ref Range   Vit D, 25-Hydroxy 33 30 - 100 ng/mL    Comment: Vitamin D  Status         25-OH Vitamin D : . Deficiency:                    <20 ng/mL Insufficiency:             20 - 29 ng/mL Optimal:                 > or = 30 ng/mL . For 25-OH Vitamin D  testing on patients on  D2-supplementation and patients for whom quantitation  of D2 and D3 fractions is required, the Quest[facility]ureD(TM) 25-OH VIT D, (D2,D3), LC/MS/MS is recommended: order  code 07111 (patients >41yrs). . See Note 1 . Note 1 . For additional information, please refer to  http://education.[facility].com/faq/FAQ199  (This link is being provided for informational/ educational purposes only.)   B12 and Folate Panel     Status: None   Collection Time: 11/30/23  4:05 PM  Result Value Ref Range   Vitamin B-12 393 200 - 1,100 pg/mL    Comment: . Please Note: Although the reference range for vitamin B12 is 7205170828 pg/mL, it has been reported that between 5 and 10%  of patients with values between 200 and 400 pg/mL may experience neuropsychiatric and hematologic abnormalities due to occult B12 deficiency; less than 1% of patients with values above 400 pg/mL will have symptoms. .    Folate 15.5 ng/mL    Comment:                            Reference Range                            Low:           <3.4                            Borderline:    3.4-5.4                            Normal:        >5.4 .   Hepatitis B surface antibody,qualitative     Status: None   Collection Time: 11/30/23  4:05 PM  Result Value Ref Range   Hep B S Ab NON-REACTIVE NON-REACTIVE      Assessment & Plan Skin lesion, right buttock Solitary lesion with  recent growth and itching. Differential includes wart and basal cell carcinoma. - Biopsy performed to confirm diagnosis and rule out malignancy. - Send biopsy to pathology for analysis. - Explained numbing process and expected sensations. - Decided against cauterization due to minimal bleeding and aesthetic concerns. - Advised to avoid showering for 24 hours, keep Band-Aid on. - Instructed to apply pressure if bleeding occurs. - Provided work note for today and tomorrow due to discomfort from sitting.   Consent signed: YES  Procedure: Skin Mass Removal Location: right buttocks  Equipment used: derma-blade, sterile scalpel, tweezers Anesthesia: 1% Lidocaine  w/o Epinephrine   Cleaned and prepped: alcohol   After consent signed, are of skin prepped with betadine. Lidocaine  w/o epinephrine  injected into skin underneath skin mass. After properly numbed sterile equipment used to remove tag.  Specimen sent for pathology analysis. Instructed on proper care to allow for proper healing. F/U for nursing visit if needed.

## 2024-02-09 ENCOUNTER — Ambulatory Visit: Payer: Self-pay | Admitting: Family Medicine

## 2024-02-09 LAB — SURGICAL PATHOLOGY

## 2024-03-15 ENCOUNTER — Other Ambulatory Visit: Payer: Self-pay | Admitting: Family Medicine

## 2024-03-15 DIAGNOSIS — J302 Other seasonal allergic rhinitis: Secondary | ICD-10-CM

## 2024-03-16 NOTE — Telephone Encounter (Signed)
 Requested Prescriptions  Pending Prescriptions Disp Refills   Azelastine  HCl 137 MCG/SPRAY SOLN [Pharmacy Med Name: AZELASTINE  0.1% (137 MCG) SPRY] 30 mL 0    Sig: PLACE 2 SPRAYS INTO BOTH NOSTRILS 2 (TWO) TIMES DAILY. USE IN EACH NOSTRIL AS DIRECTED     Ear, Nose, and Throat: Nasal Preparations - Antiallergy Passed - 03/16/2024  3:53 PM      Passed - Valid encounter within last 12 months    Recent Outpatient Visits           1 month ago Immunization due   [facility] Glenard Mire, MD   1 month ago Well adult exam   [facility] Glenard Mire, [place]   2 months ago Perennial allergic rhinitis with seasonal variation   [place] Glenard Mire, [place]   3 months ago Perennial allergic rhinitis with seasonal variation   [facility] Glenard Mire, MD   5 months ago Fire ant bite, undetermined intent, initial encounter   [facility] Bernardo Fend, [place]

## 2024-04-06 ENCOUNTER — Ambulatory Visit
Admission: RE | Admit: 2024-04-06 | Discharge: 2024-04-06 | Disposition: A | Payer: PRIVATE HEALTH INSURANCE | Source: Ambulatory Visit | Attending: Family Medicine | Admitting: Family Medicine

## 2024-04-06 DIAGNOSIS — Z1231 Encounter for screening mammogram for malignant neoplasm of breast: Secondary | ICD-10-CM | POA: Diagnosis present

## 2024-04-13 ENCOUNTER — Ambulatory Visit: Payer: Self-pay

## 2024-04-13 NOTE — Telephone Encounter (Signed)
 FYI Only or Action Required?: Action required by provider: request for appointment.  Patient was last seen in primary care on 02/07/2024 by Tamara Mire, MD.  Called Nurse Triage reporting Cough.  Symptoms began several days ago.  Interventions attempted: Rest, hydration, or home remedies.  Symptoms are: gradually worsening.Productive cough, worsening. Brown mucus. Sinus pain, wheezing.  Triage Disposition: See HCP Within 4 Hours (Or PCP Triage)  Patient/caregiver understands and will follow disposition?: Yes      Copied from CRM #[id number]. Topic: Clinical - Red Word Triage >> Apr 13, 2024  8:22 AM Deaijah H wrote: Red Word that prompted transfer to Nurse Triage: Sinus issue.. Congested, wheezing, coughing, brown phlegm Reason for Disposition  [1] MILD difficulty breathing (e.g., minimal/no SOB at rest, SOB with walking, pulse < 100) AND [2] still present when not coughing  Answer Assessment - Initial Assessment Questions 1. ONSET: When did the cough begin?      Sunday 2. SEVERITY: How bad is the cough today?      severe 3. SPUTUM: Describe the color of your sputum (e.g., none, dry cough; clear, white, yellow, green)     brown 4. HEMOPTYSIS: Are you coughing up any blood? If Yes, ask: How much? (e.g., flecks, streaks, tablespoons, etc.)     no 5. DIFFICULTY BREATHING: Are you having difficulty breathing? If Yes, ask: How bad is it? (e.g., mild, moderate, severe)      mild 6. FEVER: Do you have a fever? If Yes, ask: What is your temperature, how was it measured, and when did it start?     no 7. CARDIAC HISTORY: Do you have any history of heart disease? (e.g., heart attack, congestive heart failure)      no 8. LUNG HISTORY: Do you have any history of lung disease?  (e.g., pulmonary embolus, asthma, emphysema)     no 9. PE RISK FACTORS: Do you have a history of blood clots? (or: recent major surgery, recent prolonged travel, bedridden)     no 10.  OTHER SYMPTOMS: Do you have any other symptoms? (e.g., runny nose, wheezing, chest pain)       wheezing 11. PREGNANCY: Is there any chance you are pregnant? When was your last menstrual period?       no 12. TRAVEL: Have you traveled out of the country in the last month? (e.g., travel history, exposures)       no  Protocols used: Cough - Acute Productive-A-AH

## 2024-04-14 ENCOUNTER — Ambulatory Visit: Payer: PRIVATE HEALTH INSURANCE | Admitting: Family Medicine

## 2024-04-14 ENCOUNTER — Encounter: Payer: Self-pay | Admitting: Family Medicine

## 2024-04-14 VITALS — BP 120/76 | HR 77 | Resp 16 | Ht 66.0 in | Wt 211.0 lb

## 2024-04-14 DIAGNOSIS — J310 Chronic rhinitis: Secondary | ICD-10-CM

## 2024-04-14 DIAGNOSIS — J3489 Other specified disorders of nose and nasal sinuses: Secondary | ICD-10-CM

## 2024-04-14 DIAGNOSIS — R059 Cough, unspecified: Secondary | ICD-10-CM

## 2024-04-14 DIAGNOSIS — R040 Epistaxis: Secondary | ICD-10-CM | POA: Diagnosis not present

## 2024-04-14 DIAGNOSIS — H9203 Otalgia, bilateral: Secondary | ICD-10-CM

## 2024-04-14 LAB — POC COVID19/FLU A&B COMBO
Covid Antigen, POC: NEGATIVE
Influenza A Antigen, POC: NEGATIVE
Influenza B Antigen, POC: NEGATIVE

## 2024-04-14 MED ORDER — AMOXICILLIN 500 MG PO CAPS: 500.0000 mg | ORAL_CAPSULE | Freq: Two times a day (BID) | ORAL | 0 refills | Status: AC

## 2024-04-14 MED ORDER — MONTELUKAST SODIUM 10 MG PO TABS: 10.0000 mg | ORAL_TABLET | Freq: Every evening | ORAL | 0 refills | Status: DC

## 2024-04-14 NOTE — Progress Notes (Signed)
 Acute Office Visit  Subjective:     Patient ID: Tamara Nelson, female    DOB: 09-15-1965, 58 y.o.   MRN: [id number]  Chief Complaint  Patient presents with   Cough    X1 week, productive     HPI Patient is in today for complaints of cough starting this past Sunday. She voices over the course of the last week her symptoms have worsened. She does endorse associated sinus drainage. She voices she has been having nosebleeds. Nosebleeds lasting a few minutes, resolving easily. She does endorse associated ear pain but describes ears to feel full, right ear more bothersome than left ear.  She has been taking Xyzal  as prescribed and using Flonase  as prescribed. Medications prescribed for management of chronic rhinitis.  She voices she recently started using a humidifier last night. Unable to tell if this has made a difference since the first time using the humidifier was last night.   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  Review of Systems  Constitutional:  Positive for chills. Negative for fever.  HENT:  Positive for congestion, ear pain, nosebleeds and sinus pain. Negative for sore throat.   Respiratory:  Positive for cough. Negative for shortness of breath.   Cardiovascular:  Negative for chest pain.  Musculoskeletal:  Negative for myalgias.  Neurological:  Positive for headaches.        Objective:    BP 120/76   Pulse 77   Resp 16   Ht 5' 6 (1.676 m)   Wt 211 lb (95.7 kg)   SpO2 97%   BMI 34.06 kg/m    Physical Exam Constitutional:      General: She is not in acute distress.    Appearance: Normal appearance. She is not toxic-appearing.  HENT:     Head: Normocephalic and atraumatic.     Right Ear: Tympanic membrane is erythematous.     Left Ear: Tympanic membrane normal.  Eyes:     Conjunctiva/sclera: Conjunctivae normal.     Pupils: Pupils are equal, round, and reactive to light.   Cardiovascular:     Rate and Rhythm: Normal rate and regular rhythm.  Pulmonary:     Effort: Pulmonary effort is normal. No respiratory distress.     Breath sounds: Normal breath sounds. No wheezing, rhonchi or rales.  Musculoskeletal:     Cervical back: Neck supple. No tenderness.  Lymphadenopathy:     Cervical: Cervical adenopathy present.  Skin:    General: Skin is warm and dry.  Neurological:     General: No focal deficit present.     Mental Status: She is alert.  Psychiatric:        Mood and Affect: Mood normal.        Behavior: Behavior normal.     Results for orders placed or performed in visit on 04/14/24  POC Covid19/Flu A&B Antigen  Result Value Ref Range   Influenza A Antigen, POC Negative Negative   Influenza B Antigen, POC Negative Negative   Covid Antigen, POC Negative Negative        Assessment & Plan:   1. Cough, unspecified type (Primary) Patient presenting with complaints of cough starting one week ago. She describes cough to have worsened over the last one week. Lung fields clear to auscultation. 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 Rapid testing completed and negative for COVID and Flu. She voices she suspects that cough could be s/t chronic rhinitis . She takes Xyzal  and uses Flonase  for management of rhinitis symptoms.  -Advised she continue Xyzal  as prescribed and ok to continue with Flonase . Recommended use of OTC nasal saline spray due to complaints of epistaxis.  -Encouraged her to continue with humidifier use at bedtime.  -Add on Singulair  10mg  every evening   - POC Covid19/Flu A&B Antigen - montelukast  (SINGULAIR ) 10 MG tablet; Take 1 tablet (10 mg total) by mouth every evening.  Dispense: 30 tablet; Refill: 0  2. Sinus drainage She endorses associated sinus drainage with cough.  -Continue Flonase  as prescribed and Xyzal  as prescribed.  Encouraged she begin OTC nasal saline spray to assist with sinus congestion, drainage and epistaxis.  -Singulair  added on for management of chronic rhinitis symptoms  - montelukast  (SINGULAIR ) 10 MG tablet; Take 1 tablet (10 mg total) by mouth every evening.  Dispense: 30 tablet; Refill: 0  3. Epistaxis Epistaxis lasting no more than a few minutes and easily subsides. Recommended use of OTC nasal saline spray and encouraged her to continue with humidifier at bedtime.   4. Chronic rhinitis -Continue with Flonase  as prescribed and Xyzal  as prescribed. Added on Singulair  10mg  every evening.  - montelukast  (SINGULAIR ) 10 MG tablet; Take 1 tablet (10 mg total) by mouth every evening.  Dispense: 30 tablet; Refill: 0   5. Otalgia of both ears She complains of ear pain described as pressure affecting both ears. Right ear worse than left ear. Left TM clear to visualization without abnormalities. Right TM erythematous without drainage.  -Amoxicillin  500mg  BID x 10 days prescribed however patient advised to watch and wait x 48 hours. If symptoms continue and/or worsen ok to begin antibiotic therapy. She voices understanding and is agreeable.   - amoxicillin  (AMOXIL ) 500 MG capsule; Take 1 capsule (500 mg total) by mouth 2 (two) times daily for 10 days. Watch and wait for 48 hours. Begin antibiotic if symptoms worsen or do not improve. Take medication with food.  Dispense: 20 capsule; Refill: 0   Meds ordered this encounter  Medications   amoxicillin  (AMOXIL ) 500 MG capsule    Sig: Take 1 capsule (500 mg total) by mouth 2 (two) times daily for 10 days. Watch and wait for 48 hours. Begin antibiotic if symptoms worsen or do not improve. Take medication with food.    Dispense:  20 capsule    Refill:  0   montelukast  (SINGULAIR ) 10 MG tablet    Sig: Take 1 tablet (10 mg total) by mouth every evening.    Dispense:  30 tablet    Refill:  0    Return if symptoms worsen or fail to improve.  LAYMON LOISE CORE, FNP

## 2024-05-02 ENCOUNTER — Ambulatory Visit (INDEPENDENT_AMBULATORY_CARE_PROVIDER_SITE_OTHER): Payer: PRIVATE HEALTH INSURANCE | Admitting: Family Medicine

## 2024-05-02 ENCOUNTER — Encounter: Payer: Self-pay | Admitting: Family Medicine

## 2024-05-02 VITALS — BP 122/74 | HR 69 | Temp 97.7°F | Resp 16 | Ht 66.0 in | Wt 211.0 lb

## 2024-05-02 DIAGNOSIS — M79674 Pain in right toe(s): Secondary | ICD-10-CM

## 2024-05-02 NOTE — Progress Notes (Signed)
 "  Acute Office Visit  Subjective:     Patient ID: Tamara Nelson, female    DOB: 08-15-1965, 58 y.o.   MRN: [id number]  Chief Complaint  Patient presents with   Follow-up    [facility]- broke toe. Still black and blue and sore. Would like note extending time off until 05/08/24 due to being advised to stay off foot.    HPI Patient is in today for complaints of right foot pain. She voices she was vacuuming on Tuesday, 04/25/24, when the vacuum fell on her foot. She voices immediately she had swelling and bruising of the right foot. Denies numbness or tingling. Denies radiation of pain. She voices she saw urgent care on Christmas Eve who did imaging and told her that the toe was broken. She has been resting, elevating and icing the foot as much as possible. She voices this does seem to help but has difficulty weightbearing on the right foot even with post operative shoe allowing more heel weightbearing. She asks that her work excuse is extended to 05/08/24 due to her job being one with standing and walking.   Review of Systems  Musculoskeletal:  Positive for joint pain. Negative for falls.  Neurological:  Negative for tingling.        Objective:    BP 122/74   Pulse 69   Temp 97.7 F (36.5 C)   Resp 16   Ht 5' 6 (1.676 m)   Wt 211 lb (95.7 kg)   SpO2 98%   BMI 34.06 kg/m    Physical Exam Constitutional:      Appearance: Normal appearance.  Cardiovascular:     Rate and Rhythm: Normal rate and regular rhythm.  Pulmonary:     Effort: Pulmonary effort is normal.     Breath sounds: Normal breath sounds.  Feet:     Comments: Localized bruising of the 5th toe and MTP joint of the right foot. Minimal localized edema. Decreased range of motion of 4th and 5th toes of the right foot   Skin:    General: Skin is warm and dry.  Neurological:     General: No focal deficit present.     Mental Status: She is alert and oriented to person, place, and time.  Psychiatric:        Mood and  Affect: Mood normal.        Behavior: Behavior normal.        Assessment & Plan:   1. Pain of toe of right foot (Primary) She complains of right foot pain, specifically right 5th toe pain. She brings with her paperwork from urgent care where she was seen and diagnosed with a fracture of the 5th toe. Unable to review imaging impression or office note, however she has the after visit summary from urgent care as noted. Right toe pain as per HPI.   Localized ecchymoses of the right 5th toe and MTP joint of the right foot with minimal edema. Decreased active range of motion of the right 5th toe. She denies paresthesias. She denies radiation of pain.   -Continue with conservative management including rest, ice, elevation, Epsom salt soaks and Ibuprofen  as needed for right toe and right foot pain.  -Discussed option for ortho referral but will hold at this time. Advised if symptoms persist or worsen by return of work date, 05/08/24, ortho referral would be warranted.  -Work excuse provided as patient requested, return to work date of 05/08/24  Return if symptoms worsen or fail  to improve.  LAYMON LOISE CORE, FNP   "

## 2024-05-06 ENCOUNTER — Other Ambulatory Visit: Payer: Self-pay | Admitting: Family Medicine

## 2024-05-06 DIAGNOSIS — R059 Cough, unspecified: Secondary | ICD-10-CM

## 2024-05-06 DIAGNOSIS — J3489 Other specified disorders of nose and nasal sinuses: Secondary | ICD-10-CM

## 2024-05-06 DIAGNOSIS — J310 Chronic rhinitis: Secondary | ICD-10-CM

## 2024-05-08 NOTE — Telephone Encounter (Signed)
 Requested Prescriptions  Pending Prescriptions Disp Refills   montelukast  (SINGULAIR ) 10 MG tablet [Pharmacy Med Name: MONTELUKAST  SOD 10 MG TABLET] 90 tablet 0    Sig: TAKE 1 TABLET BY MOUTH EVERY DAY IN THE EVENING     Pulmonology:  Leukotriene Inhibitors Passed - 05/08/2024  4:15 PM      Passed - Valid encounter within last 12 months    Recent Outpatient Visits           6 days ago Pain of toe of right foot   [facility] Wall, Laymon SAILOR, FNP   3 weeks ago Cough, unspecified type   [facility] Morgan, Laymon SAILOR, FNP   3 months ago Immunization due   [facility] Glenard Mire, MD   3 months ago Well adult exam   [facility] Glenard Mire, MD   4 months ago Perennial allergic rhinitis with seasonal variation   [facility] Sowles, Krichna, MD

## 2025-01-23 ENCOUNTER — Encounter: Payer: PRIVATE HEALTH INSURANCE | Admitting: Family Medicine
